# Patient Record
Sex: Female | Born: 1970 | Race: White | Hispanic: No | Marital: Married | Smoking: Never smoker
Health system: Southern US, Community
[De-identification: ages and names within clinical notes are randomized; demographics above are authoritative.]

## PROBLEM LIST (undated history)

## (undated) DIAGNOSIS — G43109 Migraine with aura, not intractable, without status migrainosus: Secondary | ICD-10-CM

## (undated) DIAGNOSIS — Z8719 Personal history of other diseases of the digestive system: Secondary | ICD-10-CM

## (undated) DIAGNOSIS — T7840XA Allergy, unspecified, initial encounter: Secondary | ICD-10-CM

## (undated) DIAGNOSIS — R112 Nausea with vomiting, unspecified: Secondary | ICD-10-CM

## (undated) DIAGNOSIS — C801 Malignant (primary) neoplasm, unspecified: Secondary | ICD-10-CM

## (undated) DIAGNOSIS — K219 Gastro-esophageal reflux disease without esophagitis: Secondary | ICD-10-CM

## (undated) DIAGNOSIS — R42 Dizziness and giddiness: Secondary | ICD-10-CM

## (undated) DIAGNOSIS — Z803 Family history of malignant neoplasm of breast: Secondary | ICD-10-CM

## (undated) DIAGNOSIS — Z923 Personal history of irradiation: Secondary | ICD-10-CM

## (undated) DIAGNOSIS — Z9889 Other specified postprocedural states: Secondary | ICD-10-CM

## (undated) HISTORY — PX: TONSILLECTOMY: SUR1361

## (undated) HISTORY — PX: REDUCTION MAMMAPLASTY: SUR839

## (undated) HISTORY — PX: TUBAL LIGATION: SHX77

## (undated) HISTORY — DX: Family history of malignant neoplasm of breast: Z80.3

## (undated) HISTORY — DX: Migraine with aura, not intractable, without status migrainosus: G43.109

## (undated) HISTORY — DX: Malignant (primary) neoplasm, unspecified: C80.1

## (undated) HISTORY — DX: Allergy, unspecified, initial encounter: T78.40XA

## (undated) HISTORY — PX: CHOLECYSTECTOMY: SHX55

---
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
## 1998-10-09 ENCOUNTER — Encounter: Payer: Self-pay | Admitting: Orthopedic Surgery

## 1998-10-09 ENCOUNTER — Ambulatory Visit (HOSPITAL_COMMUNITY): Admission: RE | Admit: 1998-10-09 | Discharge: 1998-10-09 | Payer: Self-pay | Admitting: Orthopedic Surgery

## 1999-04-13 ENCOUNTER — Other Ambulatory Visit: Admission: RE | Admit: 1999-04-13 | Discharge: 1999-04-13 | Payer: Self-pay | Admitting: Obstetrics & Gynecology

## 1999-11-29 ENCOUNTER — Inpatient Hospital Stay (HOSPITAL_COMMUNITY): Admission: AD | Admit: 1999-11-29 | Discharge: 1999-11-29 | Payer: Self-pay | Admitting: Obstetrics and Gynecology

## 1999-11-30 ENCOUNTER — Inpatient Hospital Stay (HOSPITAL_COMMUNITY): Admission: AD | Admit: 1999-11-30 | Discharge: 1999-12-02 | Payer: Self-pay | Admitting: Obstetrics and Gynecology

## 2000-05-17 ENCOUNTER — Other Ambulatory Visit: Admission: RE | Admit: 2000-05-17 | Discharge: 2000-05-17 | Payer: Self-pay | Admitting: Obstetrics & Gynecology

## 2000-10-12 ENCOUNTER — Ambulatory Visit (HOSPITAL_COMMUNITY): Admission: RE | Admit: 2000-10-12 | Discharge: 2000-10-12 | Payer: Self-pay | Admitting: Obstetrics & Gynecology

## 2001-04-06 ENCOUNTER — Encounter: Payer: Self-pay | Admitting: Family Medicine

## 2001-04-06 LAB — CONVERTED CEMR LAB
RBC count: 4.8 10*6/uL
WBC, blood: 7 10*3/uL

## 2001-05-31 HISTORY — PX: BREAST REDUCTION SURGERY: SHX8

## 2001-06-30 ENCOUNTER — Other Ambulatory Visit: Admission: RE | Admit: 2001-06-30 | Discharge: 2001-06-30 | Payer: Self-pay | Admitting: Obstetrics and Gynecology

## 2001-11-10 ENCOUNTER — Encounter: Payer: Self-pay | Admitting: Family Medicine

## 2001-11-10 LAB — CONVERTED CEMR LAB
Blood Glucose, Fasting: 79 mg/dL
RBC count: 4.46 10*6/uL
TSH: 1.37 microintl units/mL
WBC, blood: 10.9 10*3/uL

## 2002-07-02 ENCOUNTER — Other Ambulatory Visit: Admission: RE | Admit: 2002-07-02 | Discharge: 2002-07-02 | Payer: Self-pay | Admitting: Obstetrics & Gynecology

## 2003-07-16 ENCOUNTER — Other Ambulatory Visit: Admission: RE | Admit: 2003-07-16 | Discharge: 2003-07-16 | Payer: Self-pay | Admitting: Obstetrics & Gynecology

## 2004-03-22 ENCOUNTER — Emergency Department: Payer: Self-pay | Admitting: Emergency Medicine

## 2004-04-15 ENCOUNTER — Ambulatory Visit: Payer: Self-pay | Admitting: Family Medicine

## 2004-04-28 ENCOUNTER — Ambulatory Visit: Payer: Self-pay | Admitting: Family Medicine

## 2004-07-08 ENCOUNTER — Ambulatory Visit: Payer: Self-pay | Admitting: Family Medicine

## 2004-07-21 ENCOUNTER — Other Ambulatory Visit: Admission: RE | Admit: 2004-07-21 | Discharge: 2004-07-21 | Payer: Self-pay | Admitting: Obstetrics & Gynecology

## 2004-08-05 ENCOUNTER — Ambulatory Visit: Payer: Self-pay | Admitting: Family Medicine

## 2004-12-29 ENCOUNTER — Ambulatory Visit: Payer: Self-pay | Admitting: Family Medicine

## 2006-08-01 ENCOUNTER — Ambulatory Visit: Payer: Self-pay | Admitting: Family Medicine

## 2007-11-07 ENCOUNTER — Ambulatory Visit: Payer: Self-pay | Admitting: Family Medicine

## 2007-11-07 ENCOUNTER — Encounter: Payer: Self-pay | Admitting: Family Medicine

## 2007-11-07 DIAGNOSIS — Z87898 Personal history of other specified conditions: Secondary | ICD-10-CM | POA: Insufficient documentation

## 2007-11-08 ENCOUNTER — Telehealth (INDEPENDENT_AMBULATORY_CARE_PROVIDER_SITE_OTHER): Payer: Self-pay | Admitting: Internal Medicine

## 2007-11-27 ENCOUNTER — Ambulatory Visit: Payer: Self-pay | Admitting: Family Medicine

## 2007-11-27 DIAGNOSIS — L723 Sebaceous cyst: Secondary | ICD-10-CM | POA: Insufficient documentation

## 2010-01-06 ENCOUNTER — Encounter (INDEPENDENT_AMBULATORY_CARE_PROVIDER_SITE_OTHER): Payer: Self-pay | Admitting: *Deleted

## 2010-07-02 NOTE — Progress Notes (Signed)
Summary: ? Strep/ z-pak rx  Phone Note Call from Patient   Caller: Patient Call For: Dr. Hetty Ely Summary of Call: Pt called to advise that she was seen yesterday and is feeling much worse today.  Pt stated that she was told to call back on Friday if not better and Dr. Hetty Ely would call something in for her, but has alot to do this weekend and feels that she needs medication today.  Pt stated that she had a rapid strep test done yesterday and it was negative.  Pt feels that she has strep and her stomach has been upset and she continues to have a fever.  [facility] Initial call taken by: Sydell Axon,  November 08, 2007 8:53 AM  Follow-up for Phone Call        Rx attatched, see back Mon if not improved  Billie-Lynn Tyler Deis FNP  November 08, 2007 1:49 PM   Additional Follow-up for Phone Call Additional follow up Details #1::        med phoned to pharmacy. pt informed. Additional Follow-up by: Cooper Render,  November 08, 2007 2:38 PM    New/Updated Medications: ZITHROMAX Z-PAK 250 MG  TABS (AZITHROMYCIN) use as directed   Prescriptions: ZITHROMAX Z-PAK 250 MG  TABS (AZITHROMYCIN) use as directed  #1 x 0   Entered and Authorized by:   Gildardo Griffes FNP   Signed by:   Cooper Render on 11/08/2007   Method used:   Telephoned to ...         RxID:   [id number]

## 2010-07-02 NOTE — Letter (Signed)
Summary: Janet Lynn letter  [facility]  [address] [place], [place] [address]   Phone: [phone]  Fax: [phone]       01/06/2010 MRN: [id number]  Janet Lynn [address] [place], [place]  [address]  Dear Janet Lynn,  [facility] [place], and [facility] announce the retirement of Arta Silence, M.D., from full-time practice at the [facility] office effective November 27, 2009 and his plans of returning part-time.  It is important to Dr. Hetty Ely and to our practice that you understand that [facility] has seven physicians in our office for your health care needs.  We will continue to offer the same exceptional care that you have today.    Dr. Hetty Ely has spoken to many of you about his plans for retirement and returning part-time in the fall.   We will continue to work with you through the transition to schedule appointments for you in the office and meet the high standards that [facility] is committed to.   Again, it is with great pleasure that we share the news that Dr. Hetty Ely will return to [facility] at [facility] in October of 2011 with a reduced schedule.    If you have any questions, or would like to request an appointment with one of our physicians, please call us at [phone] and press the option for Scheduling an appointment.  We take pleasure in providing you with excellent patient care and look forward to seeing you at your next office visit.  Our [facility] Physicians are:  Tillman Abide, M.D. Laurita Quint, M.D. Roxy Manns, M.D. Kerby Nora, M.D. Hannah Beat, M.D. Ruthe Mannan, M.D. We proudly welcomed Raechel Ache, M.D. and Eustaquio Boyden, M.D. to the practice in July/August 2011.  Sincerely,  [facility]

## 2010-07-02 NOTE — Assessment & Plan Note (Signed)
Summary: KNOT ON BACK OF HEAD/HEA   Vital Signs:  Patient Profile:   40 Years Old Female Height:     174 inches (441.96 cm) Weight:      189 pounds Temp:     98.3 degrees F oral Pulse rate:   80 / minute Pulse rhythm:   regular BP sitting:   140 / 80  (left arm) Cuff size:   regular  Vitals Entered By: Providence Crosby (November 27, 2007 1:50 PM)                 Chief Complaint:  CHECK KNOT ON HEAD.  History of Present Illness: Here for two small soft knots on the back of the skull/scalp, the upper one older and slightly larger which she has palpated a lot and is now sore, the smaller down and to the left of the other reasonably new.    Prior Medications Reviewed Using: Patient Recall  Current Allergies (reviewed today): ! * ERYTHROMYCIN      Physical Exam  General:     Well-developed,well-nourished,in no acute distress; alert,appropriate and cooperative throughout examination, nontoxic. Head:     Normocephalic and atraumatic without obvious abnormalities. No apparent alopecia or balding. Eyes:     Conjunctiva clear bilaterally.  Skin:     1cm lesion on upper occipital area of scalp midline with 8mm lesion on lower area of scapl to the left of other lesion, both soft , nonerythematous and reasonably nontender, nonfluctulant without discharge.    Impression & Recommendations:  Problem # 1:  SEBACEOUS CYST, SCALP (ICD-706.2) Assessment: New Presumed seb vs simple cyst vs lipoma....reassured and observe.    ]

## 2010-07-02 NOTE — Assessment & Plan Note (Signed)
Summary: ST/CLE   Vital Signs:  Patient Profile:   40 Years Old Female Weight:      186 pounds Temp:     100.8 degrees F oral Pulse rate:   88 / minute Pulse rhythm:   regular BP sitting:   120 / 80  (left arm) Cuff size:   regular  Vitals Entered By: Providence Crosby (November 07, 2007 12:31 PM)                 Chief Complaint:  severe sore throat// fever .  History of Present Illness: Sunday afternoon started with sore throat, yest Am left ear and left side or throathurt worse, had some fever last night (Did noi use thermometer) and had chills. Has no headache, ears hurts with swallowing left>right, no rhinitis but some nasal congestion., has mild , dry cough now. No SOB or N/V.  Thinks allergic to E-Mycin. Son Selena Batten  saw Dr Alphonsus Sias 29 May, Had strep by RST.     Prior Medications Reviewed Using: Patient Recall  Current Allergies (reviewed today): ! * ERYTHROMYCIN      Physical Exam  General:     Well-developed,well-nourished,in no acute distress; alert,appropriate and cooperative throughout examination Head:     Normocephalic and atraumatic without obvious abnormalities. No apparent alopecia or balding. Eyes:     Conjunctiva clear bilaterally.  Ears:     External ear exam shows no significant lesions or deformities.  Otoscopic examination reveals clear canals, tympanic membranes are intact bilaterally without bulging, retraction, inflammation or discharge. Hearing is grossly normal bilaterally. Nose:     mild inflammation with  septal dev left Mouth:     Oral mucosa and oropharynx without lesions or exudates.  Teeth in good repair. Minimal erythema in pharyngeal area. Neck:     No deformities, masses, or tenderness noted. Chest Wall:     No deformities, masses, or tenderness noted. Lungs:     Normal respiratory effort, chest expands symmetrically. Lungs are clear to auscultation, no crackles or wheezes. Heart:     Normal rate and regular rhythm. S1 and S2 normal  without gallop, murmur, click, rub or other extra sounds.    Impression & Recommendations:  Problem # 1:  PHARYNGITIS-ACUTE (ICD-462) Assessment: New See instructions. Orders: Rapid Strep ([address])  Negative    Patient Instructions: 1)  Gargle with warm salt water every for 2 days minimum.  2)  Tyl regularly, 2   325mg  tabs every 4 hrs. 3)  Drink lots of fluids. 4)  Call Friday if not improved.   ]

## 2010-07-23 ENCOUNTER — Ambulatory Visit (INDEPENDENT_AMBULATORY_CARE_PROVIDER_SITE_OTHER): Payer: BC Managed Care – PPO | Admitting: Family Medicine

## 2010-07-23 ENCOUNTER — Encounter: Payer: Self-pay | Admitting: Family Medicine

## 2010-07-23 DIAGNOSIS — J019 Acute sinusitis, unspecified: Secondary | ICD-10-CM

## 2010-07-23 DIAGNOSIS — Z23 Encounter for immunization: Secondary | ICD-10-CM

## 2010-07-23 HISTORY — DX: Acute sinusitis, unspecified: J01.90

## 2010-07-28 NOTE — Assessment & Plan Note (Signed)
Summary: COUGH,ST/CLE   BCBS   Vital Signs:  Patient profile:   40 year old female Height:      61 inches Weight:      194.50 pounds BMI:     36.88 Temp:     98.6 degrees F oral Pulse rate:   68 / minute Pulse rhythm:   regular BP sitting:   114 / 78  (left arm) Cuff size:   regular  Vitals Entered By: Sydell Axon LPN (July 23, 2010 9:29 AM) CC: Started with a cough about 2 weeks ago, productive at times/ clear to yellow and sore throat   History of Present Illness: Pt here for acute illness but hasn't been to the Gyn in 5 years...encouraged to go. Hasn't yet had a mammo and encouraged to discuss that as well.  She has had congestion, she denies headache, no fever or chills, some ear pain yresterday and today, no rhinitis but signif nasal congestion. She has ST which began today, and has had cough which began about three weeks ago, minimal production this week basically clear. No N/V, no diarrhea. She has had significant muscle aches at times, mostly this week.  She has taken Dayquil and Tussin DM.   Problems Prior to Update: 1)  Sinusitis - Acute-nos  (ICD-461.9) 2)  Sebaceous Cyst, Scalp  (ICD-706.2) 3)  Migraines, Hx of  (ICD-V13.8)  Medications Prior to Update: 1)  None  Allergies: 1)  ! * Erythromycin  Physical Exam  General:  Well-developed,well-nourished,in no acute distress; alert,appropriate and cooperative throughout examination, nontoxic but congested. Head:  Normocephalic and atraumatic without obvious abnormalities. No apparent alopecia or balding. Sinuses tender max. Eyes:  Conjunctiva clear bilaterally.  Ears:  External ear exam shows no significant lesions or deformities.  Otoscopic examination reveals clear canals, tympanic membranes are intact bilaterally without bulging, retraction, inflammation or discharge. Hearing is grossly normal bilaterally. Nose:  mild inflammation with  septal dev left Mouth:  Oral mucosa and oropharynx without lesions or  exudates.  Teeth in good repair. Minimal erythema in pharyngeal area. Neck:  No deformities, masses, or tenderness noted. Lungs:  Normal respiratory effort, chest expands symmetrically. Lungs are clear to auscultation, no crackles or wheezes. Heart:  Normal rate and regular rhythm. S1 and S2 normal without gallop, murmur, click, rub or other extra sounds.   Impression & Recommendations:  Problem # 1:  SINUSITIS - ACUTE-NOS (ICD-461.9) Assessment New  See instructions. Her updated medication list for this problem includes:    Dayquil Multi-symptom 30-325-10 Mg/29ml Liqd (Pseudoephedrine-apap-dm) .Marland Kitchen... As needed    Amoxicillin 500 Mg Caps (Amoxicillin) .Marland Kitchen... 2 tabs by mouth two times a day  Instructed on treatment. Call if symptoms persist or worsen.   Complete Medication List: 1)  Dayquil Multi-symptom 30-325-10 Mg/62ml Liqd (Pseudoephedrine-apap-dm) .... As needed 2)  Amoxicillin 500 Mg Caps (Amoxicillin) .... 2 tabs by mouth two times a day  Other Orders: Tdap => 67yrs IM (57322) Admin 1st Vaccine (02542)  Patient Instructions: 1)  Tdap today. 2)  Take Amox for two weeks. 3)  Take Guaifenesin by [facility] to [facility] or [facility] and getting MUCOUS RELIEF EXPECTORANT (400mg ), take 11/2 tabs by mouth AM and NOON. 4)  Drink lots of fluids anytime taking Guaifenesin.  5)  Take Delsym during for cough. 6)  Use Nyquil. 7)  See Gyn. Prescriptions: AMOXICILLIN 500 MG CAPS (AMOXICILLIN) 2 tabs by mouth two times a day  #56 x 0   Entered and Authorized by:  Shaune Leeks MD   Signed by:   Shaune Leeks MD on 07/23/2010   Method used:   Electronically to        [facility]* (retail)       [address]       [place], [place]  [address]       Ph: [phone]       Fax: [phone]   RxID:   [id number]    Orders Added: 1)  Est. Patient Level III [40347] 2)  Tdap => 90yrs IM [42595] 3)  Admin 1st Vaccine [63875]   Immunizations  Administered:  Tetanus Vaccine:    Vaccine Type: Tdap    Site: left deltoid    Mfr: GlaxoSmithKline    Dose: 0.5 ml    Route: IM    Given by: Sydell Axon LPN    Exp. Date: 03/19/2012    Lot #: [id number]    VIS given: 04/17/08 version given July 23, 2010.   Immunizations Administered:  Tetanus Vaccine:    Vaccine Type: Tdap    Site: left deltoid    Mfr: GlaxoSmithKline    Dose: 0.5 ml    Route: IM    Given by: Sydell Axon LPN    Exp. Date: 03/19/2012    Lot #: [id number]    VIS given: 04/17/08 version given July 23, 2010.  Current Allergies (reviewed today): ! * ERYTHROMYCIN

## 2010-09-01 ENCOUNTER — Encounter: Payer: Self-pay | Admitting: Family Medicine

## 2010-09-03 ENCOUNTER — Ambulatory Visit (INDEPENDENT_AMBULATORY_CARE_PROVIDER_SITE_OTHER): Payer: BC Managed Care – PPO | Admitting: Family Medicine

## 2010-09-03 ENCOUNTER — Encounter: Payer: Self-pay | Admitting: Family Medicine

## 2010-09-03 VITALS — BP 110/70 | HR 80 | Temp 98.7°F | Ht 61.0 in | Wt 195.8 lb

## 2010-09-03 DIAGNOSIS — L608 Other nail disorders: Secondary | ICD-10-CM

## 2010-09-03 DIAGNOSIS — L603 Nail dystrophy: Secondary | ICD-10-CM

## 2010-09-03 NOTE — Progress Notes (Signed)
S: Pt here for suspected toenail fungus infection. She has had dystrophic nails for many months to poss as long as the last year. Affects most nails in thickening and white flakiness, middle toes not affected. Her fingernails are unaffected. She has never been seen or treated for this in the past. O: WDWN WF NAD Nails of hands nml Nails of toes thick and white with flakiness, some of affected nails clear in the immediate paronychial area, middle toes both unaffected. A: toenail Dystrophy P: Will sen culture for fungus. Discussed trmt and side effects poss to include Liver toxicity and chance of recurrence.  Also discussed rauma and frequency of this problem, esp in females. If culture pos, she should call insurance to see coverage of meds due to cost. Will treat if pos and she desires trmt.

## 2010-09-29 LAB — CULTURE, FUNGUS WITHOUT SMEAR

## 2010-10-14 ENCOUNTER — Ambulatory Visit (INDEPENDENT_AMBULATORY_CARE_PROVIDER_SITE_OTHER): Payer: BC Managed Care – PPO | Admitting: Family Medicine

## 2010-10-14 ENCOUNTER — Encounter: Payer: Self-pay | Admitting: Family Medicine

## 2010-10-14 VITALS — BP 112/78 | HR 88 | Temp 97.9°F | Wt 197.2 lb

## 2010-10-14 DIAGNOSIS — B351 Tinea unguium: Secondary | ICD-10-CM | POA: Insufficient documentation

## 2010-10-14 MED ORDER — TERBINAFINE HCL 250 MG PO TABS: 250.0000 mg | ORAL_TABLET | Freq: Every day | ORAL | Status: AC

## 2010-10-14 NOTE — Assessment & Plan Note (Signed)
Will treat today for three months with Lamisil. Labs as ordered.

## 2010-10-14 NOTE — Progress Notes (Signed)
  Subjective:    Patient ID: Janet Lynn, female    DOB: 1971-01-28, 40 y.o.   MRN: [id number]  HPI Pt here for followup of fungal nail infection of the toes from the last visit. Culture results positive for trychopyton species. She has no other problems and understands some possible risk of liver toxicity with trmt and the fact that toe fungus is often recurrent. She desires trmt. She has no complaints otherwise and feels well.    Review of SystemsNoncontributory except as above.      Objective:   Physical Exam Thickened white crusting flaking nails on multiple toes.        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 Treat with Lamisil 250 daily for three months. Labs today and in 6 weeks.

## 2010-10-14 NOTE — Patient Instructions (Signed)
Pls schedule CBC and Hepatic panel in 6 weeks, 995.9 Fungal medication trmt.

## 2010-10-15 ENCOUNTER — Ambulatory Visit: Payer: BC Managed Care – PPO | Admitting: Family Medicine

## 2010-10-15 LAB — CBC WITH DIFFERENTIAL/PLATELET
Basophils Absolute: 0.1 10*3/uL (ref 0.0–0.1)
Basophils Relative: 0.8 % (ref 0.0–3.0)
Eosinophils Absolute: 0.3 10*3/uL (ref 0.0–0.7)
Eosinophils Relative: 2.1 % (ref 0.0–5.0)
HCT: 39.6 % (ref 36.0–46.0)
Hemoglobin: 13.6 g/dL (ref 12.0–15.0)
Lymphocytes Relative: 26 % (ref 12.0–46.0)
Lymphs Abs: 3.5 10*3/uL (ref 0.7–4.0)
MCHC: 34.5 g/dL (ref 30.0–36.0)
MCV: 89.4 fl (ref 78.0–100.0)
Monocytes Absolute: 0.8 10*3/uL (ref 0.1–1.0)
Monocytes Relative: 5.7 % (ref 3.0–12.0)
Neutro Abs: 8.9 10*3/uL — ABNORMAL HIGH (ref 1.4–7.7)
Neutrophils Relative %: 65.4 % (ref 43.0–77.0)
Platelets: 418 10*3/uL — ABNORMAL HIGH (ref 150.0–400.0)
RBC: 4.43 Mil/uL (ref 3.87–5.11)
RDW: 13.3 % (ref 11.5–14.6)
WBC: 13.6 10*3/uL — ABNORMAL HIGH (ref 4.5–10.5)

## 2010-10-15 LAB — HEPATIC FUNCTION PANEL
ALT: 16 U/L (ref 0–35)
AST: 17 U/L (ref 0–37)
Albumin: 3.5 g/dL (ref 3.5–5.2)
Alkaline Phosphatase: 58 U/L (ref 39–117)
Bilirubin, Direct: 0 mg/dL (ref 0.0–0.3)
Total Bilirubin: 0.2 mg/dL — ABNORMAL LOW (ref 0.3–1.2)
Total Protein: 6.3 g/dL (ref 6.0–8.3)

## 2010-11-23 ENCOUNTER — Other Ambulatory Visit (INDEPENDENT_AMBULATORY_CARE_PROVIDER_SITE_OTHER): Payer: BC Managed Care – PPO

## 2010-11-23 DIAGNOSIS — B351 Tinea unguium: Secondary | ICD-10-CM

## 2010-11-23 LAB — CBC WITH DIFFERENTIAL/PLATELET
Basophils Absolute: 0.1 10*3/uL (ref 0.0–0.1)
Basophils Relative: 0.5 % (ref 0.0–3.0)
Eosinophils Absolute: 0.2 10*3/uL (ref 0.0–0.7)
Eosinophils Relative: 1.8 % (ref 0.0–5.0)
HCT: 40.1 % (ref 36.0–46.0)
Hemoglobin: 13.5 g/dL (ref 12.0–15.0)
Lymphocytes Relative: 26.2 % (ref 12.0–46.0)
Lymphs Abs: 2.8 10*3/uL (ref 0.7–4.0)
MCHC: 33.7 g/dL (ref 30.0–36.0)
MCV: 90.3 fl (ref 78.0–100.0)
Monocytes Absolute: 0.8 10*3/uL (ref 0.1–1.0)
Monocytes Relative: 7.2 % (ref 3.0–12.0)
Neutro Abs: 6.9 10*3/uL (ref 1.4–7.7)
Neutrophils Relative %: 64.3 % (ref 43.0–77.0)
Platelets: 366 10*3/uL (ref 150.0–400.0)
RBC: 4.44 Mil/uL (ref 3.87–5.11)
RDW: 13.1 % (ref 11.5–14.6)
WBC: 10.8 10*3/uL — ABNORMAL HIGH (ref 4.5–10.5)

## 2010-11-23 LAB — HEPATIC FUNCTION PANEL
ALT: 17 U/L (ref 0–35)
AST: 16 U/L (ref 0–37)
Albumin: 3.8 g/dL (ref 3.5–5.2)
Alkaline Phosphatase: 66 U/L (ref 39–117)
Bilirubin, Direct: 0.1 mg/dL (ref 0.0–0.3)
Total Bilirubin: 0.4 mg/dL (ref 0.3–1.2)
Total Protein: 6.5 g/dL (ref 6.0–8.3)

## 2011-07-27 ENCOUNTER — Encounter: Payer: Self-pay | Admitting: Family Medicine

## 2011-07-27 ENCOUNTER — Ambulatory Visit (INDEPENDENT_AMBULATORY_CARE_PROVIDER_SITE_OTHER): Payer: BC Managed Care – PPO | Admitting: Family Medicine

## 2011-07-27 VITALS — BP 122/86 | HR 92 | Temp 98.4°F | Wt 182.0 lb

## 2011-07-27 DIAGNOSIS — R42 Dizziness and giddiness: Secondary | ICD-10-CM

## 2011-07-27 NOTE — Progress Notes (Signed)
She's been losing weight since the fall of 2012.  On 07/13/11 she was exercising but then got dizzy after exercise.  Vomited several times in first 24 hours, that was the worst period of sx.  The room was spinning.  Vertigo returns if laying on L side.  Mild sinus congestion.  Sx happen every day, but not all day.  No syncope. Vertigo is quick onset, can be brief. She is gradually getting better.  No focal neuro changes o/w, ie weakness.   H/o vertigo prev, with bending over, but this is worse than prev.    Meds, vitals, and allergies reviewed.   ROS: See HPI.  Otherwise, noncontributory.  GEN: nad, alert and oriented HEENT: mucous membranes moist NECK: supple w/o LA CV: rrr.  no murmur PULM: ctab, no inc wob ABD: soft, +bs EXT: no edema SKIN: no acute rash CN 2-12 wnl B, S/S/DTR wnl x4 DHP positive for sx

## 2011-07-27 NOTE — Assessment & Plan Note (Addendum)
Likely BPV.  Use meclizine or other otc motion sickness meds.  Should improve/resolve, but can return.  Use bedside exercise and call back as needed.   

## 2011-07-27 NOTE — Patient Instructions (Signed)
Likely BPV.  Use meclizine or other otc motion sickness meds.  Should improve/resolve, but can return.  Use bedside exercise and call back as needed.

## 2011-12-21 ENCOUNTER — Ambulatory Visit (INDEPENDENT_AMBULATORY_CARE_PROVIDER_SITE_OTHER): Payer: BC Managed Care – PPO | Admitting: Family Medicine

## 2011-12-21 ENCOUNTER — Encounter: Payer: Self-pay | Admitting: Family Medicine

## 2011-12-21 VITALS — BP 138/98 | HR 82 | Temp 98.7°F | Wt 170.8 lb

## 2011-12-21 DIAGNOSIS — M25539 Pain in unspecified wrist: Secondary | ICD-10-CM | POA: Insufficient documentation

## 2011-12-21 NOTE — Progress Notes (Signed)
Nodule on L wrist, dorsum.  Noted more in last 2 weeks, enlarging.  Pain in hand up the arm, constant.  Dull ache and that episodically worsens. Grip is still normal.  No R handed sx.  Is right handed.  No trauma recently, she had strained her wrist in 9/12.  Types a lot with work.  No neck pain except for some occ muscle pain on the L trap area.  Used a brace for only a day or two.    Meds, vitals, and allergies reviewed.   ROS: See HPI.  Otherwise, noncontributory.  nad Normal ROM neck but L trap tight Normal ROM L shoulder and elbow Normal inspection, ROM of wrist but small dorsal mass c/w ganglion palpated on wrist Finklestein positive but strength intact Distally NV intact phalen and tinel neg

## 2011-12-21 NOTE — Patient Instructions (Addendum)
Wear the brace as much as tolerated.  Ice your wrist several times a day.  Take ibuprofen with food 2-3 times a day.  Stretch your neck.  If worse or not improved, then call back and we'll set up referral to the hand center.

## 2011-12-21 NOTE — Assessment & Plan Note (Signed)
Likely combination of tendonitis and irritation from compression due to ganglion.  Would ice and wear wrist brace, ibuprofen with GI caution.  Refer to hand clinic if not improved. Not c/w CTS.  D/w pt about ROM, stretching for trap sx.  She agrees.

## 2015-04-07 ENCOUNTER — Ambulatory Visit (INDEPENDENT_AMBULATORY_CARE_PROVIDER_SITE_OTHER): Payer: BLUE CROSS/BLUE SHIELD | Admitting: Family Medicine

## 2015-04-07 ENCOUNTER — Encounter: Payer: Self-pay | Admitting: Family Medicine

## 2015-04-07 ENCOUNTER — Encounter (INDEPENDENT_AMBULATORY_CARE_PROVIDER_SITE_OTHER): Payer: Self-pay

## 2015-04-07 VITALS — BP 114/70 | HR 93 | Temp 98.6°F | Wt 194.5 lb

## 2015-04-07 DIAGNOSIS — H6980 Other specified disorders of Eustachian tube, unspecified ear: Secondary | ICD-10-CM

## 2015-04-07 MED ORDER — BENZONATATE 200 MG PO CAPS: 200.0000 mg | ORAL_CAPSULE | Freq: Three times a day (TID) | ORAL | Status: DC | PRN

## 2015-04-07 NOTE — Progress Notes (Signed)
Pre visit review using our clinic review tool, if applicable. No additional management support is needed unless otherwise documented below in the visit note.  Sx started about 8 days ago.  Started with ST, then had fever and ST continued the next day.  Was out of work 04/01/15.  ST continues.  Some cough recently.  Some nasal congestion and bloody rhinorrhea.  Cough is more often at night.  No fever now, none in the last ~5 days.  Taking nyquil OTC prn qhs.  No vomiting, no diarrhea.  Some sputum.  L ear pain, continues.  No R ear pain.  Some wheeze with the cough.  Not improved much in the last few days, though her fever is resolved.  Hoarse today.  No known sick contacts.    Meds, vitals, and allergies reviewed.   ROS: See HPI.  Otherwise, noncontributory.  GEN: nad, alert and oriented HEENT: mucous membranes moist, tm w/o erythema, nasal exam w/o erythema, clear discharge noted with some small areas of crusted blood noted,  OP with cobblestoning, L ETD noted.  Sinuses not ttp x4 NECK: supple w/o LA CV: rrr.   PULM: ctab, no inc wob

## 2015-04-07 NOTE — Patient Instructions (Signed)
Likely eustachian tube dysfunction.  Try tessalon for the cough.  Gargle with saline, try using nasal saline.  Take claritin if the nyquil doesn't have benadryl in it.  Gently try to pop your ears.  Take care.  Glad to see you.

## 2015-04-08 DIAGNOSIS — H6980 Other specified disorders of Eustachian tube, unspecified ear: Secondary | ICD-10-CM | POA: Insufficient documentation

## 2015-04-08 NOTE — Assessment & Plan Note (Signed)
Likely viral. Nontoxic.  Likely eustachian tube dysfunction.  Try tessalon for the cough.  Gargle with saline, try using nasal saline.  Take claritin if the nyquil doesn't have benadryl in it.  Gently try to pop ears.  F/u prn.  Should resolve.  She agrees.

## 2016-05-31 LAB — HM HEPATITIS C SCREENING LAB: HM Hepatitis Screen: NEGATIVE

## 2016-05-31 LAB — HM HIV SCREENING LAB: HM HIV Screening: NEGATIVE

## 2016-07-12 DIAGNOSIS — Z124 Encounter for screening for malignant neoplasm of cervix: Secondary | ICD-10-CM | POA: Diagnosis not present

## 2016-07-12 DIAGNOSIS — Z01419 Encounter for gynecological examination (general) (routine) without abnormal findings: Secondary | ICD-10-CM | POA: Diagnosis not present

## 2016-07-12 DIAGNOSIS — Z1231 Encounter for screening mammogram for malignant neoplasm of breast: Secondary | ICD-10-CM | POA: Diagnosis not present

## 2016-07-12 DIAGNOSIS — Z6834 Body mass index (BMI) 34.0-34.9, adult: Secondary | ICD-10-CM | POA: Diagnosis not present

## 2016-07-12 LAB — HM PAP SMEAR

## 2016-08-03 ENCOUNTER — Encounter: Payer: Self-pay | Admitting: Family Medicine

## 2017-04-11 DIAGNOSIS — Z6830 Body mass index (BMI) 30.0-30.9, adult: Secondary | ICD-10-CM | POA: Diagnosis not present

## 2017-04-11 DIAGNOSIS — L723 Sebaceous cyst: Secondary | ICD-10-CM | POA: Diagnosis not present

## 2017-07-15 ENCOUNTER — Encounter: Payer: Self-pay | Admitting: Family Medicine

## 2017-07-15 ENCOUNTER — Ambulatory Visit (INDEPENDENT_AMBULATORY_CARE_PROVIDER_SITE_OTHER): Payer: BLUE CROSS/BLUE SHIELD | Admitting: Family Medicine

## 2017-07-15 VITALS — BP 118/76 | HR 93 | Temp 99.2°F | Wt 156.5 lb

## 2017-07-15 DIAGNOSIS — J019 Acute sinusitis, unspecified: Secondary | ICD-10-CM

## 2017-07-15 MED ORDER — FLUTICASONE PROPIONATE 50 MCG/ACT NA SUSP: 2.0000 | Freq: Every day | NASAL | 1 refills | Status: DC

## 2017-07-15 NOTE — Assessment & Plan Note (Signed)
Anticipate viral given short duration. Supportive care reviewed as per instructions. Red flags to update [place] for abx course reviewed. Pt agrees with plan.

## 2017-07-15 NOTE — Patient Instructions (Signed)
You have a sinus viral infection. Take medicine as prescribed: flonase.  Push fluids and plenty of rest. Nasal saline irrigation or neti pot to help drain sinuses. May use plain mucinex with plenty of fluid to help mobilize mucous. Take ibuprofen 400-600mg  with meals for next 3-4 days.  Please let us know if fever >101.5, worsening productive cough, or if ongoing symptoms of head congestion and headache past 7-10 days to consider antibiotic course.    Sinusitis, Adult Sinusitis is soreness and inflammation of your sinuses. Sinuses are hollow spaces in the bones around your face. Your sinuses are located:  Around your eyes.  In the middle of your forehead.  Behind your nose.  In your cheekbones.  Your sinuses and nasal passages are lined with a stringy fluid (mucus). Mucus normally drains out of your sinuses. When your nasal tissues become inflamed or swollen, the mucus can become trapped or blocked so air cannot flow through your sinuses. This allows bacteria, viruses, and funguses to grow, which leads to infection. Sinusitis can develop quickly and last for 7?10 days (acute) or for more than 12 weeks (chronic). Sinusitis often develops after a cold. What are the causes? This condition is caused by anything that creates swelling in the sinuses or stops mucus from draining, including:  Allergies.  Asthma.  Bacterial or viral infection.  Abnormally shaped bones between the nasal passages.  Nasal growths that contain mucus (nasal polyps).  Narrow sinus openings.  Pollutants, such as chemicals or irritants in the air.  A foreign object stuck in the nose.  A fungal infection. This is rare.  What increases the risk? The following factors may make you more likely to develop this condition:  Having allergies or asthma.  Having had a recent cold or respiratory tract infection.  Having structural deformities or blockages in your nose or sinuses.  Having a weak immune  system.  Doing a lot of swimming or diving.  Overusing nasal sprays.  Smoking.  What are the signs or symptoms? The main symptoms of this condition are pain and a feeling of pressure around the affected sinuses. Other symptoms include:  Upper toothache.  Earache.  Headache.  Bad breath.  Decreased sense of smell and taste.  A cough that may get worse at night.  Fatigue.  Fever.  Thick drainage from your nose. The drainage is often green and it may contain pus (purulent).  Stuffy nose or congestion.  Postnasal drip. This is when extra mucus collects in the throat or back of the nose.  Swelling and warmth over the affected sinuses.  Sore throat.  Sensitivity to light.  How is this diagnosed? This condition is diagnosed based on symptoms, a medical history, and a physical exam. To find out if your condition is acute or chronic, your health care provider may:  Look in your nose for signs of nasal polyps.  Tap over the affected sinus to check for signs of infection.  View the inside of your sinuses using an imaging device that has a light attached (endoscope).  If your health care provider suspects that you have chronic sinusitis, you may also:  Be tested for allergies.  Have a sample of mucus taken from your nose (nasal culture) and checked for bacteria.  Have a mucus sample examined to see if your sinusitis is related to an allergy.  If your sinusitis does not respond to treatment and it lasts longer than 8 weeks, you may have an MRI or CT scan to  check your sinuses. These scans also help to determine how severe your infection is. In rare cases, a bone biopsy may be done to rule out more serious types of fungal sinus disease. How is this treated? Treatment for sinusitis depends on the cause and whether your condition is chronic or acute. If a virus is causing your sinusitis, your symptoms will go away on their own within 10 days. You may be given medicines to  relieve your symptoms, including:  Topical nasal decongestants. They shrink swollen nasal passages and let mucus drain from your sinuses.  Antihistamines. These drugs block inflammation that is triggered by allergies. This can help to ease swelling in your nose and sinuses.  Topical nasal corticosteroids. These are nasal sprays that ease inflammation and swelling in your nose and sinuses.  Nasal saline washes. These rinses can help to get rid of thick mucus in your nose.  If your condition is caused by bacteria, you will be given an antibiotic medicine. If your condition is caused by a fungus, you will be given an antifungal medicine. Surgery may be needed to correct underlying conditions, such as narrow nasal passages. Surgery may also be needed to remove polyps. Follow these instructions at home: Medicines  Take, use, or apply over-the-counter and prescription medicines only as told by your health care provider. These may include nasal sprays.  If you were prescribed an antibiotic medicine, take it as told by your health care provider. Do not stop taking the antibiotic even if you start to feel better. Hydrate and Humidify  Drink enough water to keep your urine clear or pale yellow. Staying hydrated will help to thin your mucus.  Use a cool mist humidifier to keep the humidity level in your home above 50%.  Inhale steam for 10-15 minutes, 3-4 times a day or as told by your health care provider. You can do this in the bathroom while a hot shower is running.  Limit your exposure to cool or dry air. Rest  Rest as much as possible.  Sleep with your head raised (elevated).  Make sure to get enough sleep each night. General instructions  Apply a warm, moist washcloth to your face 3-4 times a day or as told by your health care provider. This will help with discomfort.  Wash your hands often with soap and water to reduce your exposure to viruses and other germs. If soap and water are  not available, use hand sanitizer.  Do not smoke. Avoid being around people who are smoking (secondhand smoke).  Keep all follow-up visits as told by your health care provider. This is important. Contact a health care provider if:  You have a fever.  Your symptoms get worse.  Your symptoms do not improve within 10 days. Get help right away if:  You have a severe headache.  You have persistent vomiting.  You have pain or swelling around your face or eyes.  You have vision problems.  You develop confusion.  Your neck is stiff.  You have trouble breathing. This information is not intended to replace advice given to you by your health care provider. Make sure you discuss any questions you have with your health care provider. Document Released: 05/17/2005 Document Revised: 01/11/2016 Document Reviewed: 03/12/2015 Elsevier Interactive Patient Education  [employer].

## 2017-07-15 NOTE — Progress Notes (Signed)
BP 118/76 (BP Location: Left Arm, Patient Position: Sitting, Cuff Size: Normal)   Pulse 93   Temp 99.2 F (37.3 C) (Oral)   Wt 156 lb 8 oz (71 kg)   LMP 06/02/2017   SpO2 99%   BMI 29.57 kg/m    CC: sinus congestion Subjective:    Patient ID: Janet Lynn, female    DOB: 16-Oct-1970, 47 y.o.   MRN: [id number]  HPI: Janet Lynn is a 47 y.o. female presenting on 07/15/2017 for Sinus Problem (Runny nose, nasal congestion- green mucous and sinus pressure/pain. Started 07/13/17. Taking Nyquil and Alka Seltzer Cold, not helpful.)   3d h/o nasal congestion, rhinorrhea, productive coughing, ST, PNdrainage, felt feverish, pressure headache.   No chills, ear or tooth pain, abd pain, dyspnea or wheezing So far has tried nyquil and alka seltzer cold with only temporary relief.  No sick contacts at home. No smokers at home.  No h/o asthma   Relevant past medical, surgical, family and social history reviewed and updated as indicated. Interim medical history since our last visit reviewed. Allergies and medications reviewed and updated. Outpatient Medications Prior to Visit  Medication Sig Dispense Refill  . benzonatate (TESSALON) 200 MG capsule Take 1 capsule (200 mg total) by mouth 3 (three) times daily as needed. 30 capsule 1   No facility-administered medications prior to visit.      Per HPI unless specifically indicated in ROS section below Review of Systems     Objective:    BP 118/76 (BP Location: Left Arm, Patient Position: Sitting, Cuff Size: Normal)   Pulse 93   Temp 99.2 F (37.3 C) (Oral)   Wt 156 lb 8 oz (71 kg)   LMP 06/02/2017   SpO2 99%   BMI 29.57 kg/m   Wt Readings from Last 3 Encounters:  07/15/17 156 lb 8 oz (71 kg)  04/07/15 194 lb 8 oz (88.2 kg)  12/21/11 170 lb 12 oz (77.5 kg)    Physical Exam  Constitutional: She appears well-developed and well-nourished. No distress.  HENT:  Head: Normocephalic and atraumatic.  Right Ear: Hearing,  tympanic membrane, external ear and ear canal normal.  Left Ear: Hearing, tympanic membrane, external ear and ear canal normal.  Nose: Mucosal edema (nasal mucosal congestion and erythema) present. No rhinorrhea. Right sinus exhibits maxillary sinus tenderness. Right sinus exhibits no frontal sinus tenderness. Left sinus exhibits maxillary sinus tenderness. Left sinus exhibits no frontal sinus tenderness.  Mouth/Throat: Uvula is midline, oropharynx is clear and moist and mucous membranes are normal. No oropharyngeal exudate, posterior oropharyngeal edema, posterior oropharyngeal erythema or tonsillar abscesses.  Eyes: Conjunctivae and EOM are normal. Pupils are equal, round, and reactive to light. No scleral icterus.  Neck: Normal range of motion. Neck supple.  Cardiovascular: Normal rate, regular rhythm, normal heart sounds and intact distal pulses.  No murmur heard. Pulmonary/Chest: Effort normal and breath sounds normal. No respiratory distress. She has no wheezes. She has no rales.  Lymphadenopathy:    She has no cervical adenopathy.  Skin: Skin is warm and dry. No rash noted.  Nursing note and vitals reviewed.     Assessment & Plan:   Problem List Items Addressed This Visit    Acute sinusitis - Primary    Anticipate viral given short duration. Supportive care reviewed as per instructions. Red flags to update [place] for abx course reviewed. Pt agrees with plan.       Relevant Medications   fluticasone (FLONASE) 50 MCG/ACT nasal  spray       Meds ordered this encounter  Medications  . fluticasone (FLONASE) 50 MCG/ACT nasal spray    Sig: Place 2 sprays into both nostrils daily.    Dispense:  16 g    Refill:  1   No orders of the defined types were placed in this encounter.   Follow up plan: Return if symptoms worsen or fail to improve.  Ria Bush, MD

## 2017-07-19 DIAGNOSIS — Z6829 Body mass index (BMI) 29.0-29.9, adult: Secondary | ICD-10-CM | POA: Diagnosis not present

## 2017-07-19 DIAGNOSIS — Z0000 Encounter for general adult medical examination without abnormal findings: Secondary | ICD-10-CM | POA: Diagnosis not present

## 2017-07-19 DIAGNOSIS — Z01419 Encounter for gynecological examination (general) (routine) without abnormal findings: Secondary | ICD-10-CM | POA: Diagnosis not present

## 2017-07-19 DIAGNOSIS — Z113 Encounter for screening for infections with a predominantly sexual mode of transmission: Secondary | ICD-10-CM | POA: Diagnosis not present

## 2017-07-19 DIAGNOSIS — Z1231 Encounter for screening mammogram for malignant neoplasm of breast: Secondary | ICD-10-CM | POA: Diagnosis not present

## 2017-07-20 LAB — HM MAMMOGRAPHY

## 2017-07-21 ENCOUNTER — Encounter: Payer: Self-pay | Admitting: Family Medicine

## 2017-07-21 LAB — LAB REPORT - SCANNED
Cholesterol, Total: 209
Creatinine, Ser: 0.68
Glucose: 87
HDL Cholesterol: 44
Hemoglobin: 14.4
LDL Cholesterol (Calc): 131
TSH: 2.52
Triglycerides: 172

## 2017-08-16 ENCOUNTER — Encounter: Payer: Self-pay | Admitting: Family Medicine

## 2018-08-01 DIAGNOSIS — Z6829 Body mass index (BMI) 29.0-29.9, adult: Secondary | ICD-10-CM | POA: Diagnosis not present

## 2018-08-01 DIAGNOSIS — Z124 Encounter for screening for malignant neoplasm of cervix: Secondary | ICD-10-CM | POA: Diagnosis not present

## 2018-08-01 DIAGNOSIS — Z1231 Encounter for screening mammogram for malignant neoplasm of breast: Secondary | ICD-10-CM | POA: Diagnosis not present

## 2018-08-01 DIAGNOSIS — Z01419 Encounter for gynecological examination (general) (routine) without abnormal findings: Secondary | ICD-10-CM | POA: Diagnosis not present

## 2018-08-01 LAB — HM PAP SMEAR

## 2018-08-02 LAB — HM MAMMOGRAPHY

## 2018-08-08 ENCOUNTER — Encounter: Payer: Self-pay | Admitting: Family Medicine

## 2019-11-27 DIAGNOSIS — N959 Unspecified menopausal and perimenopausal disorder: Secondary | ICD-10-CM | POA: Diagnosis not present

## 2019-11-27 DIAGNOSIS — Z1231 Encounter for screening mammogram for malignant neoplasm of breast: Secondary | ICD-10-CM | POA: Diagnosis not present

## 2019-11-27 DIAGNOSIS — Z0000 Encounter for general adult medical examination without abnormal findings: Secondary | ICD-10-CM | POA: Diagnosis not present

## 2019-11-27 DIAGNOSIS — Z01419 Encounter for gynecological examination (general) (routine) without abnormal findings: Secondary | ICD-10-CM | POA: Diagnosis not present

## 2019-11-27 DIAGNOSIS — Z6834 Body mass index (BMI) 34.0-34.9, adult: Secondary | ICD-10-CM | POA: Diagnosis not present

## 2019-11-27 LAB — HM MAMMOGRAPHY

## 2019-11-28 LAB — LAB REPORT - SCANNED
ALT: 15 (ref 3–30)
AST: 14
Cholesterol: 241
Creatinine, Ser: 0.89
FSH: 51.6
Glucose: 113
HDL: 52
Hemoglobin: 14
LDL (calc): 158
Platelets: 388
TSH: 2.44
Triglycerides: 171 — AB (ref 40–160)
WBC: 11.8

## 2019-12-13 ENCOUNTER — Encounter: Payer: Self-pay | Admitting: Family Medicine

## 2019-12-24 ENCOUNTER — Telehealth: Payer: Self-pay | Admitting: *Deleted

## 2019-12-24 NOTE — Telephone Encounter (Signed)
Patient left a voicemail stating that she has had covid now for 8 days and is still feeling  Lightheaded.  Called patient back and was advised that her son tested positive for covid on 12/16/19. Patient stated the next day she started with symptoms. Patient stated that she has a productive cough (green) and feels lightheaded at times. Patient denies a fever, SOB or difficulty breathing. Patient stated that she did not go get tested because she is sure that she has covid. Patient scheduled for a virtual visit with Dr. Einar Pheasant 12/25/19 at 12:20. Patient was given ER precautions and she verbalized understanding.

## 2019-12-25 ENCOUNTER — Telehealth (INDEPENDENT_AMBULATORY_CARE_PROVIDER_SITE_OTHER): Payer: HRSA Program | Admitting: Family Medicine

## 2019-12-25 ENCOUNTER — Encounter: Payer: Self-pay | Admitting: Family Medicine

## 2019-12-25 ENCOUNTER — Other Ambulatory Visit: Payer: Self-pay

## 2019-12-25 VITALS — Temp 97.7°F | Wt 163.0 lb

## 2019-12-25 DIAGNOSIS — U071 COVID-19: Secondary | ICD-10-CM | POA: Diagnosis not present

## 2019-12-25 NOTE — Telephone Encounter (Signed)
See visit from today

## 2019-12-25 NOTE — Assessment & Plan Note (Signed)
No specific risk factors. Suspect lightheadedness is dehydration in setting of diarrhea. Advised drinking at least 5 bottles of water per day. Denies SOB with activity - though if dizziness persists would recommend in-person evaluation at urgent so vitals. Denies heart racing and reports good cap refill - though mouth dry. If new fevers call, if worsening symptoms call. Discussed course - typical 2 weeks, cough could linger 2-4 months - no need for antibiotics at this time.

## 2019-12-25 NOTE — Progress Notes (Signed)
I connected with Janet Lynn on 12/25/19 at 12:20 PM EDT by video and verified that I am speaking with the correct person using two identifiers.   I discussed the limitations, risks, security and privacy concerns of performing an evaluation and management service by video and the availability of in person appointments. I also discussed with the patient that there may be a patient responsible charge related to this service. The patient expressed understanding and agreed to proceed.  Patient location: Home Provider Location: [place] Participants: [name] and Tilley Tickle [place]   Subjective:     Janet Lynn is a 49 y.o. female presenting for Cough (x 8 days), Diarrhea (x 5 days ), and Dizziness (x 4 days )     HPI   #Covid-19 infection - son was diagnosed - came home on 7/16 and was diagnosed on 7/18 - 12/17/2019 was when her symptoms began - fever, headache, sore throat, cough - cough is persisting - productive  - lack of appetite - lightheadedness - cannot stand up for very long - gets lightheaded with just sitting in the chair - hydration: water mostly - 3 bottle of water per day - was doing gatorade in the beginning - diarrhea x 5 days - 3-4 times per day - breathing - has been good - denies SOB with activity    Has not vaccine  Review of Systems   Social History   Tobacco Use  Smoking Status Never Smoker  Smokeless Tobacco Never Used        Objective:   BP Readings from Last 3 Encounters:  07/15/17 118/76  04/07/15 114/70  12/21/11 (!) 138/98   Wt Readings from Last 3 Encounters:  12/25/19 163 lb (73.9 kg)  07/15/17 156 lb 8 oz (71 kg)  04/07/15 194 lb 8 oz (88.2 kg)   Temp 97.7 F (36.5 C) (Oral)   Wt 163 lb (73.9 kg)   BMI 30.80 kg/m    Physical Exam Constitutional:      Appearance: Normal appearance. She is not ill-appearing.  HENT:     Head: Normocephalic and atraumatic.     Right Ear: External ear  normal.     Left Ear: External ear normal.  Eyes:     Conjunctiva/sclera: Conjunctivae normal.  Pulmonary:     Effort: Pulmonary effort is normal. No respiratory distress.  Neurological:     Mental Status: She is alert. Mental status is at baseline.  Psychiatric:        Mood and Affect: Mood normal.        Behavior: Behavior normal.        Thought Content: Thought content normal.        Judgment: Judgment normal.             Assessment & Plan:   Problem List Items Addressed This Visit      Other   COVID-19 virus infection - Primary    No specific risk factors. Suspect lightheadedness is dehydration in setting of diarrhea. Advised drinking at least 5 bottles of water per day. Denies SOB with activity - though if dizziness persists would recommend in-person evaluation at urgent so vitals. Denies heart racing and reports good cap refill - though mouth dry. If new fevers call, if worsening symptoms call. Discussed course - typical 2 weeks, cough could linger 2-4 months - no need for antibiotics at this time.           Return if  symptoms worsen or fail to improve.  [name], MD

## 2021-02-11 ENCOUNTER — Emergency Department
Admission: EM | Admit: 2021-02-11 | Discharge: 2021-02-11 | Disposition: A | Discharge status: Home / Self Care | Payer: BC Managed Care – PPO | Attending: Emergency Medicine | Admitting: Emergency Medicine

## 2021-02-11 ENCOUNTER — Emergency Department: Payer: BC Managed Care – PPO

## 2021-02-11 ENCOUNTER — Encounter: Payer: Self-pay | Admitting: Emergency Medicine

## 2021-02-11 ENCOUNTER — Other Ambulatory Visit: Payer: Self-pay

## 2021-02-11 ENCOUNTER — Telehealth: Payer: Self-pay | Admitting: *Deleted

## 2021-02-11 DIAGNOSIS — R1010 Upper abdominal pain, unspecified: Secondary | ICD-10-CM | POA: Diagnosis not present

## 2021-02-11 DIAGNOSIS — K2900 Acute gastritis without bleeding: Secondary | ICD-10-CM

## 2021-02-11 DIAGNOSIS — K8080 Other cholelithiasis without obstruction: Secondary | ICD-10-CM | POA: Insufficient documentation

## 2021-02-11 DIAGNOSIS — R1011 Right upper quadrant pain: Secondary | ICD-10-CM | POA: Insufficient documentation

## 2021-02-11 DIAGNOSIS — Z8616 Personal history of COVID-19: Secondary | ICD-10-CM | POA: Diagnosis not present

## 2021-02-11 LAB — COMPREHENSIVE METABOLIC PANEL
ALT: 19 U/L (ref 0–44)
AST: 19 U/L (ref 15–41)
Albumin: 3.9 g/dL (ref 3.5–5.0)
Alkaline Phosphatase: 72 U/L (ref 38–126)
Anion gap: 8 (ref 5–15)
BUN: 20 mg/dL (ref 6–20)
CO2: 28 mmol/L (ref 22–32)
Calcium: 9.1 mg/dL (ref 8.9–10.3)
Chloride: 99 mmol/L (ref 98–111)
Creatinine, Ser: 1.02 mg/dL — ABNORMAL HIGH (ref 0.44–1.00)
GFR, Estimated: >60 mL/min (ref >60)
Glucose, Bld: 92 mg/dL (ref 70–99)
Potassium: 3.8 mmol/L (ref 3.5–5.1)
Sodium: 135 mmol/L (ref 135–145)
Total Bilirubin: 0.5 mg/dL (ref 0.3–1.2)
Total Protein: 7.1 g/dL (ref 6.5–8.1)

## 2021-02-11 LAB — CBC
HCT: 42.3 % (ref 36.0–46.0)
Hemoglobin: 14 g/dL (ref 12.0–15.0)
MCH: 29.4 pg (ref 26.0–34.0)
MCHC: 33.1 g/dL (ref 30.0–36.0)
MCV: 88.7 fL (ref 80.0–100.0)
Platelets: 407 10*3/uL — ABNORMAL HIGH (ref 150–400)
RBC: 4.77 MIL/uL (ref 3.87–5.11)
RDW: 12.5 % (ref 11.5–15.5)
WBC: 11.9 10*3/uL — ABNORMAL HIGH (ref 4.0–10.5)
nRBC: 0 % (ref 0.0–0.2)

## 2021-02-11 LAB — LIPASE, BLOOD: Lipase: 35 U/L (ref 11–51)

## 2021-02-11 LAB — URINALYSIS, COMPLETE (UACMP) WITH MICROSCOPIC
Bilirubin Urine: NEGATIVE
Glucose, UA: NEGATIVE mg/dL
Hgb urine dipstick: NEGATIVE
Ketones, ur: NEGATIVE mg/dL
Nitrite: NEGATIVE
Protein, ur: NEGATIVE mg/dL
Specific Gravity, Urine: 1.008 (ref 1.005–1.030)
pH: 6 (ref 5.0–8.0)

## 2021-02-11 LAB — PREGNANCY, URINE: Preg Test, Ur: NEGATIVE

## 2021-02-11 MED ORDER — SUCRALFATE 1 G PO TABS: 1.0000 g | ORAL_TABLET | Freq: Four times a day (QID) | ORAL | 0 refills | Status: DC

## 2021-02-11 MED ORDER — PANTOPRAZOLE SODIUM 40 MG PO TBEC: 40.0000 mg | DELAYED_RELEASE_TABLET | Freq: Every day | ORAL | 1 refills | Status: DC

## 2021-02-11 NOTE — ED Provider Notes (Signed)
[facility] Emergency Department Provider Note  Time seen: 7:35 PM  I have reviewed the triage vital signs and the nursing notes.   HISTORY  Chief Complaint Abdominal pain  HPI Janet Lynn is a 50 y.o. female with a past medical history of migraines, presents emergency department for upper abdominal pain.  According to the patient over the past 4 days or so she has been experiencing pain across the upper abdomen described more as a cramping or burning type pain.  Patient states nausea but no vomiting.  No diarrhea.  No fever.  Patient does state a history of heartburn and has been worse over the past several days as well.  Patient has noticed some association with eating 1 to 2 hours after eating notices that the pain worsens.  Still has her gallbladder.  States mild to moderate 5/10 dull pain currently.   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 Authorizing Provider  acetaminophen (TYLENOL) 500 MG tablet Take 500 mg by mouth every 6 (six) hours as needed.    [provider]  DM-Phenylephrine-Acetaminophen (VICKS DAYQUIL COLD & FLU PO) Take by mouth every 4 (four) hours as needed.    [provider]  fluticasone (FLONASE) 50 MCG/ACT nasal spray Place 2 sprays into both nostrils daily. Patient not taking: Reported on  12/25/2019 07/15/17   Ria Bush, MD    Allergies  Allergen Reactions   Erythromycin     REACTION: rash    Family History  Problem Relation Age of Onset   Hypertension Mother    Diabetes Mother    Colon cancer Neg Hx    Breast cancer Neg Hx     Social History Social History   Tobacco Use   Smoking status: Never   Smokeless tobacco: Never  Substance Use Topics   Alcohol use: No   Drug use: No    Review of Systems Constitutional: Negative for fever. Cardiovascular: Negative for chest pain. Respiratory: Negative for shortness of breath. Gastrointestinal: Dull upper abdominal pain.  Positive for nausea.  Negative for recent vomiting or diarrhea. Genitourinary: Negative for urinary compaints Musculoskeletal: Negative for musculoskeletal complaints Neurological: Negative for headache All other ROS negative  ____________________________________________   PHYSICAL EXAM:  VITAL SIGNS: ED Triage Vitals  Enc Vitals Group     BP 02/11/21 1746 (!) 145/100     Pulse Rate 02/11/21 1746 93     Resp 02/11/21 1746 20     Temp 02/11/21 1746 98 F (36.7 C)     Temp Source 02/11/21 1746 Oral     SpO2 02/11/21 1746 100 %     Weight 02/11/21 1749 175 lb (79.4 kg)     Height 02/11/21 1749 '5\' 1"'$  (1.549 m)  Head Circumference --      Peak Flow --      Pain Score 02/11/21 1748 6     Pain Loc --      Pain Edu? --      Excl. in [place]? --    Constitutional: Alert and oriented. Well appearing and in no distress. Eyes: Normal exam ENT      Head: Normocephalic and atraumatic.      Mouth/Throat: Mucous membranes are moist. Cardiovascular: Normal rate, regular rhythm.  Respiratory: Normal respiratory effort without tachypnea nor retractions. Breath sounds are clear  Gastrointestinal: Soft, mild upper abdominal tenderness palpation without rebound or guarding. Musculoskeletal: Nontender with normal range of motion in all extremities.  Neurologic:  Normal speech and language.  No gross focal neurologic deficits  Skin:  Skin is warm, dry and intact.  Psychiatric: Mood and affect are normal.   ____________________________________________    EKG  EKG viewed and interpreted by myself shows a normal sinus rhythm 88 bpm with a narrow QRS, normal axis, normal intervals, nonspecific ST changes.  ____________________________________________    RADIOLOGY  Ultrasound shows cholelithiasis with no signs of cholecystitis.  ____________________________________________   INITIAL IMPRESSION / ASSESSMENT AND PLAN / ED COURSE  Pertinent labs & imaging results that were available during my care of the patient were reviewed by me and considered in my medical decision making (see chart for details).   Patient presents emergency department for for 5 days of upper abdominal pain somewhat worse after eating food.  Also has noted increased heartburn over the past 4 5 days as well.  Patient does have mild tenderness across the upper abdomen both on the right upper quadrant epigastric and left upper quadrant.  Is mild.  Remainder the abdomen is benign without rebound guarding or distention.  Patient's labs have resulted largely within normal limits.  LFTs are largely normal.  Lipase is normal.  White blood cell count slightly elevated 11.9.  We will obtain a right upper quadrant ultrasound to further evaluate.  If the right upper quadrant ultrasound is normal anticipate likely discharge home treating for very likely gastritis and having the patient follow-up with GI medicine.  Patient agreeable to plan of care.  Ultrasound shows cholelithiasis with no cholecystitis.  Lab work largely reassuring including normal LFTs and normal lipase.  Highly suspect gastritis/reflux to be the cause of the patient's discomfort.  We will place the patient on Protonix for the next 2 months as well as Carafate to be taken as needed.  Discussed dietary precautions as well as return precautions as well as GI  follow-up.  Janet Lynn was evaluated in Emergency Department on 02/11/2021 for the symptoms described in the history of present illness. She was evaluated in the context of the global COVID-19 pandemic, which necessitated consideration that the patient might be at risk for infection with the SARS-CoV-2 virus that causes COVID-19. Institutional protocols and algorithms that pertain to the evaluation of patients at risk for COVID-19 are in a state of rapid change based on information released by regulatory bodies including the CDC and federal and state organizations. These policies and algorithms were followed during the patient's care in the ED.  ____________________________________________   FINAL CLINICAL IMPRESSION(S) / ED DIAGNOSES  Upper abdominal pain   Harvest Dark, MD 02/11/21 2107

## 2021-02-11 NOTE — ED Notes (Signed)
US at bedside

## 2021-02-11 NOTE — Telephone Encounter (Signed)
I'll await the ER note.

## 2021-02-11 NOTE — Telephone Encounter (Signed)
Spoke to patient by telephone and was advised that she has had abdominal pain off and on since Saturday. Patient stated that the pain is in the center of her stomach. Patient stated that she had similar pain several months ago that cleared up.  Patient stated that she is planning on going to [facility]  ER around 5:00 pm today to be evaluated.

## 2021-02-11 NOTE — Telephone Encounter (Signed)
Noted. Thanks.

## 2021-02-11 NOTE — ED Triage Notes (Signed)
Pt to ED POV c/o upper abdominal pain. Pt states it started Saturday morning that feels like cramping and stabbing pain. Pt states she is SOB, and upper back pain. Pt has had Nausea and vomiting, denies diarrhea.  Pt also states she has heart burn that has gone off and on since Saturday as well.   A&Ox4, NAD

## 2021-02-11 NOTE — Telephone Encounter (Signed)
PLEASE NOTE: All timestamps contained within this report are represented as [place] Standard Time. CONFIDENTIALTY NOTICE: This fax transmission is intended only for the addressee. It contains information that is legally privileged, confidential or otherwise protected from use or disclosure. If you are not the intended recipient, you are strictly prohibited from reviewing, disclosing, copying using or disseminating any of this information or taking any action in reliance on or regarding this information. If you have received this fax in error, please notify us immediately by telephone so that we can arrange for its return to [place]. Phone: [phone], Toll-Free: [phone], Fax: [phone] Page: 1 of 2 Call Id: [id number] [facility] - Client TELEPHONE ADVICE RECORD [facility] Patient Name: Janet Lynn [place] Gender: Female DOB: 1970-09-17 Age: 50 Y 3 M 16 D Return Phone Number: [phone] (Primary), [phone] (Secondary) Address: [place]  [address] Client [facility] - Client Client Site [facility] - Day Physician Renford Dills - MD Contact Type Call Who Is Calling Patient / Member / Family / Caregiver Call Type Triage / Clinical Relationship To Patient Self Return Phone Number [phone] (Primary) Chief Complaint SEVERE ABDOMINAL PAIN - Severe pain in abdomen Reason for Call Symptomatic / Request for [place] states is Janet Lynn with the Dr's office - Pt called to schedule appt for upper severe abdominal pain and today she broke out in a sweat. Translation No Nurse Assessment Nurse: Laurence Compton, RN, Horris Latino Date/Time (Eastern Time): 02/11/2021 3:15:13 PM Confirm and document reason for call. If symptomatic, describe symptoms. ---Pt called to schedule appt for severe upper abdominal pain and today she broke out in a sweat. Has nausea. Started Saturday.  Had something similar in August for two days. No medications taken. Does the patient have any new or worsening symptoms? ---Yes Will a triage be completed? ---Yes Related visit to physician within the last 2 weeks? ---No Does the PT have any chronic conditions? (i.e. diabetes, asthma, this includes High risk factors for pregnancy, etc.) ---No Is the patient pregnant or possibly pregnant? (Ask all females between the ages of 61-55) ---No Is this a behavioral health or substance abuse call? ---No Guidelines Guideline Title Affirmed Question Affirmed Notes Nurse Date/Time (Eastern Time) Abdominal Pain - Upper [1] SEVERE pain (e.g., excruciating) AND [2] present > 1 hour Du, RN, Horris Latino 02/11/2021 3:17:30 PM PLEASE NOTE: All timestamps contained within this report are represented as [place] Standard Time. CONFIDENTIALTY NOTICE: This fax transmission is intended only for the addressee. It contains information that is legally privileged, confidential or otherwise protected from use or disclosure. If you are not the intended recipient, you are strictly prohibited from reviewing, disclosing, copying using or disseminating any of this information or taking any action in reliance on or regarding this information. If you have received this fax in error, please notify us immediately by telephone so that we can arrange for its return to [place]. Phone: [phone], Toll-Free: [phone], Fax: [phone] Page: 2 of 2 Call Id: [id number] [place]. Time Eilene Ghazi Time) Disposition Final User 02/11/2021 3:12:55 PM Send to Urgent Tawanna Cooler 02/11/2021 3:19:38 PM Go to ED Now Yes Du, RN, Stefano Gaul Disagree/Comply Comply Caller Understands Yes PreDisposition InappropriateToAsk Care Advice Given Per Guideline GO TO ED NOW: * Leave now. Drive carefully. NOTHING BY MOUTH: * Do not eat or drink anything for now. * Reason: Condition may need surgery and general anesthesia. CARE ADVICE given per  Abdominal Pain,  Upper (Adult) guideline. Comments User: Theodoro Kalata, RN Date/Time (Eastern Time): 02/11/2021 3:20:43 PM Caller states pain is an 8 on 0-10 pain scale Referrals [facility] - ED

## 2021-02-24 ENCOUNTER — Encounter: Payer: Self-pay | Admitting: Family Medicine

## 2021-02-24 ENCOUNTER — Other Ambulatory Visit: Payer: Self-pay

## 2021-02-24 ENCOUNTER — Ambulatory Visit (INDEPENDENT_AMBULATORY_CARE_PROVIDER_SITE_OTHER): Payer: Self-pay | Admitting: Family Medicine

## 2021-02-24 VITALS — BP 118/84 | HR 60 | Temp 97.6°F | Ht 61.0 in | Wt 200.0 lb

## 2021-02-24 DIAGNOSIS — Z1211 Encounter for screening for malignant neoplasm of colon: Secondary | ICD-10-CM

## 2021-02-24 DIAGNOSIS — K219 Gastro-esophageal reflux disease without esophagitis: Secondary | ICD-10-CM

## 2021-02-24 DIAGNOSIS — R109 Unspecified abdominal pain: Secondary | ICD-10-CM

## 2021-02-24 NOTE — Progress Notes (Signed)
This visit occurred during the SARS-CoV-2 public health emergency.  Safety protocols were in place, including screening questions prior to the visit, additional usage of staff PPE, and extensive cleaning of exam room while observing appropriate contact time as indicated for disinfecting solutions.  She had ER eval for abd pain.  B upper abdomen.  Had been going on for a few days prior to eval.  Had some nausea and sweats.  Some back in the back.  Doesn't have h/o ulcers.  Doesn't smoking.  No etoh.  No hematemesis.  Was regurgitating some acidic material.  No black or bloody stools.  Abd pain is better now, with occ sx that are less bad.  She had been eating bland foods.  She is drinking diet mtn dew with caffeine.    [place] with  1. Cholelithiasis without acute cholecystitis. 2. Mild hepatic steatosis.  D/w pt about GB path/phys and fatty liver.  She is still on PPI.    Meds, vitals, and allergies reviewed.   ROS: Per HPI unless specifically indicated in ROS section   GEN: nad, alert and oriented HEENT: ncat NECK: supple w/o LA CV: rrr.   PULM: ctab, no inc wob ABD: soft, +bs, not ttp EXT: no edema SKIN: no acute rash, no jaundice.   Skin well perfused.

## 2021-02-24 NOTE — Patient Instructions (Addendum)
Taper caffeine and we'll call about seeing the GI clinic.  If you want to see the general surgeons then let me know.   Take care.  Glad to see you.  Finish the protonix.    Recheck labs prior to a physical in the spring.

## 2021-02-25 DIAGNOSIS — R109 Unspecified abdominal pain: Secondary | ICD-10-CM | POA: Insufficient documentation

## 2021-02-25 NOTE — Assessment & Plan Note (Signed)
ER evaluation and labs discussed with patient.  Normal lipase.  Ultrasound discussed with patient about cholelithiasis and mild hepatic steatosis.  No sign of cholecystitis.  It does not appear that cholelithiasis is symptomatic.  Discussed options.  Discussed diet and anatomy.  Discussed issues related to gallstones and eating fatty foods.  Discussed hepatic steatosis.  Discussed diet changes  Advised to taper caffeine and refer to GI clinic.  We can refer to general surgery if she desires.  It does not appear that is an urgent issue.  Advised to avoid fatty foods in the meantime. Finish the protonix.   Recheck labs prior to a physical in the spring.  She agrees to plan.

## 2021-03-02 ENCOUNTER — Telehealth: Payer: Self-pay

## 2021-03-02 NOTE — Telephone Encounter (Signed)
Pt. Calling to schedule colonoscopy 

## 2021-03-02 NOTE — Telephone Encounter (Signed)
Returned patients call. Patient expressed she would like to schedule EGD as well. Informed patient she would need an office visit. Visit has been scheduled to discuss colonoscopy/EGD.

## 2021-04-06 ENCOUNTER — Ambulatory Visit: Payer: BC Managed Care – PPO | Admitting: Gastroenterology

## 2021-04-06 ENCOUNTER — Other Ambulatory Visit: Payer: Self-pay

## 2021-04-06 ENCOUNTER — Encounter: Payer: Self-pay | Admitting: Gastroenterology

## 2021-04-06 VITALS — BP 131/94 | HR 83 | Temp 99.0°F | Ht 61.0 in | Wt 199.2 lb

## 2021-04-06 DIAGNOSIS — E669 Obesity, unspecified: Secondary | ICD-10-CM | POA: Insufficient documentation

## 2021-04-06 DIAGNOSIS — N926 Irregular menstruation, unspecified: Secondary | ICD-10-CM | POA: Insufficient documentation

## 2021-04-06 DIAGNOSIS — K8020 Calculus of gallbladder without cholecystitis without obstruction: Secondary | ICD-10-CM | POA: Diagnosis not present

## 2021-04-06 DIAGNOSIS — Z1211 Encounter for screening for malignant neoplasm of colon: Secondary | ICD-10-CM

## 2021-04-06 DIAGNOSIS — R1010 Upper abdominal pain, unspecified: Secondary | ICD-10-CM

## 2021-04-06 MED ORDER — NA SULFATE-K SULFATE-MG SULF 17.5-3.13-1.6 GM/177ML PO SOLN: 1.0000 | Freq: Once | ORAL | 0 refills | Status: AC

## 2021-04-06 NOTE — Progress Notes (Signed)
Cephas Darby, MD [address]  [place]  [place], [place] [address]  Main: [phone]  Fax: [phone]    Gastroenterology Consultation  Referring Provider:     Tonia Ghent, MD Primary Care Physician:  Tonia Ghent, MD Primary Gastroenterologist:  Dr. Cephas Darby Reason for Consultation:     Upper abdominal pain, discuss about colonoscopy        HPI:   Janet Lynn is a 50 y.o. female referred by Dr. Tonia Ghent, MD  for consultation & management of upper abdominal pain.  Patient went to ER on 9/14 secondary to acute episode of upper abdominal pain, sharp and sudden onset, associated with nausea and vomiting, pain radiating to her shoulder blades.  Patient denies heartburn.  She had mild leukocytosis, normal serum lipase, LFTs, urinalysis, pregnancy test.  She underwent ultrasound abdomen which revealed cholelithiasis and fatty liver.  Patient was given Protonix 40 mg daily.  She reports that she had another episode day before yesterday she woke up in the morning with similar episode of sharp pain across her upper abdomen radiating to the shoulder.  She had cheese hamburger and sandwich the day before.  Patient is referred to GI to evaluate for upper endoscopy as well as colonoscopy.  Patient is currently asymptomatic.  She reports having regular bowel movements  She does not smoke or drink alcohol  NSAIDs: None  Antiplts/Anticoagulants/Anti thrombotics: None  GI Procedures: None She denies family history of GI malignancy  Past Medical History:  Diagnosis Date   Allergy    Migraine with aura     Past Surgical History:  Procedure Laterality Date   APPENDECTOMY  1987   BREAST REDUCTION SURGERY     THYROID SURGERY  1989   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   TONSILLECTOMY     TUBAL LIGATION     bilateral    Current Outpatient Medications:    acetaminophen (TYLENOL) 500 MG tablet, Take 500 mg by mouth every 6 (six) hours as needed.,  Disp: , Rfl:    Na Sulfate-K Sulfate-Mg Sulf 17.5-3.13-1.6 GM/177ML SOLN, Take 1 kit by mouth once for 1 dose., Disp: 354 mL, Rfl: 0   pantoprazole (PROTONIX) 40 MG tablet, Take 1 tablet (40 mg total) by mouth daily., Disp: 30 tablet, Rfl: 1    Family History  Problem Relation Age of Onset   Hypertension Mother    Diabetes Mother    Colon cancer Neg Hx    Breast cancer Neg Hx      Social History   Tobacco Use   Smoking status: Never   Smokeless tobacco: Never  Substance Use Topics   Alcohol use: No   Drug use: No    Allergies as of 04/06/2021 - Review Complete 04/06/2021  Allergen Reaction Noted   Erythromycin  11/07/2007    Review of Systems:    All systems reviewed and negative except where noted in HPI.   Physical Exam:  BP (!) 131/94 (BP Location: Left Arm, Patient Position: Sitting, Cuff Size: Normal)   Pulse 83   Temp 99 F (37.2 C) (Oral)   Ht 5' 1"  (1.549 m)   Wt 199 lb 3.2 oz (90.4 kg)   BMI 37.64 kg/m  No LMP recorded.  General:   Alert,  Well-developed, well-nourished, pleasant and cooperative in NAD Head:  Normocephalic and atraumatic. Eyes:  Sclera clear, no icterus.   Conjunctiva pink. Ears:  Normal auditory acuity. Nose:  No deformity,  discharge, or lesions. Mouth:  No deformity or lesions,oropharynx pink & moist. Neck:  Supple; no masses or thyromegaly. Lungs:  Respirations even and unlabored.  Clear throughout to auscultation.   No wheezes, crackles, or rhonchi. No acute distress. Heart:  Regular rate and rhythm; no murmurs, clicks, rubs, or gallops. Abdomen:  Normal bowel sounds. Soft, non-tender and non-distended without masses, hepatosplenomegaly or hernias noted.  No guarding or rebound tenderness.   Rectal: Not performed Msk:  Symmetrical without gross deformities. Good, equal movement & strength bilaterally. Pulses:  Normal pulses noted. Extremities:  No clubbing or edema.  No cyanosis. Neurologic:  Alert and oriented x3;  grossly normal  neurologically. Skin:  Intact without significant lesions or rashes. No jaundice. Psych:  Alert and cooperative. Normal mood and affect.  Imaging Studies: Reviewed  Assessment and Plan:   Janet Lynn is a 50 y.o. female with BMI 37.64, is seen in consultation for two discrete episodes of sharp upper abdominal pain radiating to right shoulder associated with nausea and vomiting, imaging revealed cholelithiasis  Patient likely has symptomatic cholelithiasis Recommend referral to general surgery to evaluate for cholecystectomy In the meantime, continue Protonix 40 mg daily before breakfast We will schedule upper endoscopy to evaluate for peptic ulcer disease, H. pylori gastritis or erosive esophagitis  Colon cancer screening Discussed about screening colonoscopy  I have discussed alternative options, risks & benefits,  which include, but are not limited to, bleeding, infection, perforation,respiratory complication & drug reaction.  The patient agrees with this plan & written consent will be obtained.     Follow up based on above work-up   Cephas Darby, MD

## 2021-04-06 NOTE — Progress Notes (Signed)
Sent in referral to general surgery

## 2021-04-22 ENCOUNTER — Ambulatory Visit: Payer: BC Managed Care – PPO | Admitting: Surgery

## 2021-04-29 ENCOUNTER — Ambulatory Visit: Payer: BC Managed Care – PPO | Admitting: Surgery

## 2021-04-29 ENCOUNTER — Other Ambulatory Visit: Payer: Self-pay

## 2021-04-29 ENCOUNTER — Encounter: Payer: Self-pay | Admitting: Surgery

## 2021-04-29 ENCOUNTER — Telehealth: Payer: Self-pay | Admitting: Surgery

## 2021-04-29 VITALS — BP 147/80 | HR 72 | Temp 98.1°F | Ht 61.0 in | Wt 201.8 lb

## 2021-04-29 DIAGNOSIS — K8020 Calculus of gallbladder without cholecystitis without obstruction: Secondary | ICD-10-CM | POA: Diagnosis not present

## 2021-04-29 NOTE — Progress Notes (Signed)
04/29/2021  Reason for Visit:  Symptomatic cholelithiasis  Requesting Provider:  Sherri Sear, MD  History of Present Illness: Janet Lynn is a 50 y.o. female presenting for evaluation of symptomatic cholelithiasis.  The patient presented to the emergency room on 02/11/2021 with abdominal pain she had an ultrasound that showed cholelithiasis.  She was referred to GI for her upper abdominal pain and was given a prescription for Protonix and Carafate.  She has seen Dr. Marius Ditch but her symptoms were more consistent with cholelithiasis and was referred to [place] for further evaluation.  As a precaution she is scheduled for EGD and also colonoscopy with Dr. Marius Ditch on 05/12/2021.  The patient reports that she has had a few episodes of biliary colic over the last few months but has been doing well recently.  Her episodes consist of upper abdominal and right upper quadrant pain radiates towards the right shoulder.  Denies any nausea or vomiting.  Her last episode was a couple of weeks ago and this happened after eating a cheeseburger.  She reports that the Protonix that she was given in the emergency room has not really helped much and she stopped taking it because of that.  Currently denies any fevers, chills, chest pain, shortness of breath, nausea, vomiting.  Past Medical History: Past Medical History:  Diagnosis Date   Acute sinusitis 07/23/2010   Allergy    Migraine with aura      Past Surgical History: Past Surgical History:  Procedure Laterality Date   APPENDECTOMY  1987   BREAST REDUCTION SURGERY     THYROID SURGERY  1989   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   TONSILLECTOMY     TUBAL LIGATION     bilateral    Home Medications: Prior to Admission medications   None    Allergies: Allergies  Allergen Reactions   Erythromycin     REACTION: rash    Social History:  reports that she has never smoked. She has never used smokeless tobacco. She reports that she does not drink alcohol  and does not use drugs.   Family History: Family History  Problem Relation Age of Onset   Hypertension Mother    Diabetes Mother    Colon cancer Neg Hx    Breast cancer Neg Hx     Review of Systems: Review of Systems  Constitutional:  Negative for chills and fever.  HENT:  Negative for hearing loss.   Respiratory:  Negative for shortness of breath.   Cardiovascular:  Negative for chest pain.  Gastrointestinal:  Positive for abdominal pain. Negative for constipation, diarrhea, nausea and vomiting.  Genitourinary:  Negative for dysuria.  Musculoskeletal:  Negative for myalgias.  Skin:  Negative for rash.  Neurological:  Negative for dizziness.  Psychiatric/Behavioral:  Negative for depression.    Physical Exam BP (!) 147/80   Pulse 72   Temp 98.1 F (36.7 C) (Oral)   Ht 5\' 1"  (1.549 m)   Wt 201 lb 12.8 oz (91.5 kg)   SpO2 97%   BMI 38.13 kg/m  CONSTITUTIONAL: No acute distress, well-nourished HEENT:  Normocephalic, atraumatic, extraocular motion intact. NECK: Trachea is midline, and there is no jugular venous distension.  RESPIRATORY:  Lungs are clear, and breath sounds are equal bilaterally. Normal respiratory effort without pathologic use of accessory muscles. CARDIOVASCULAR: Heart is regular without murmurs, gallops, or rubs. GI: The abdomen is soft, nondistended, with some mild discomfort in the upper abdomen and right upper quadrant.  Negative Murphy sign.  She has a well-healed open appendectomy scar.  MUSCULOSKELETAL:  Normal muscle strength and tone in all four extremities.  No peripheral edema or cyanosis. SKIN: Skin turgor is normal. There are no pathologic skin lesions.  NEUROLOGIC:  Motor and sensation is grossly normal.  Cranial nerves are grossly intact. PSYCH:  Alert and oriented to person, place and time. Affect is normal.  Laboratory Analysis: Labs from 02/11/2021: Sodium 135, potassium 3.8, chloride 99, CO2 28, BUN 20, creatinine 1.02.  Total bilirubin  0.5, AST 19, ALT 19, alkaline phosphatase 72, lipase 35, albumin 3.9.  WBC 11.9, hemoglobin 14, hematocrit 42.3, platelets 407.  Imaging: Ultrasound RUQ on 02/11/2021: IMPRESSION: 1. Cholelithiasis without acute cholecystitis. 2. Mild hepatic steatosis.  Assessment and Plan: This is a 50 y.o. female with symptomatic cholelithiasis with a few episodes of biliary colic over the last few months.  -Discussed with the patient that I would agree with Dr. Marius Ditch that her symptoms are likely related to her cholelithiasis.  I think is appropriate as a precaution to do an EGD and she is currently at screening age for colonoscopy as well.  Reviewed with her the potential options for conservative measures with a low-fat diet versus surgical intervention.  She would rather proceed with surgical intervention.  However she would also want to wait until January 2023 to do her surgery.  She currently scheduled for her EGD and colonoscopy on 05/12/2021 with Dr. Marius Ditch and would rather not have surgery so close to Christmas.  Given that she has only had a few episodes of biliary colic rather than daily constant pain, I think is okay to wait until then. - Reviewed with her the plan for robotic cholecystectomy and discussed with her the risks of bleeding, infection, injury to surrounding structures, this is an outpatient procedure, postoperative recovery and pain control and activity restrictions, and she is willing to proceed.  We will schedule her for 06/02/2021.  I spent 80 minutes dedicated to the care of this patient on the date of this encounter to include pre-visit review of records, face-to-face time with the patient discussing diagnosis and management, and any post-visit coordination of care.   Melvyn Neth, [facility]

## 2021-04-29 NOTE — Patient Instructions (Addendum)
Our surgery scheduler Pamala Hurry will call you within 24-48 hours to get you scheduled. If you have not heard from her after 48 hours, please call our office. You will not need to get Covid tested before surgery and have the blue sheet available when she calls to write down important information.   If you have any concerns or questions, please feel free to call our office.   Cholelithiasis Cholelithiasis happens when gallstones form in the gallbladder. The gallbladder stores bile. Bile is a fluid that helps digest fats. Bile can harden and form into gallstones. If they cause a blockage, they can cause pain (gallbladder attack). What are the causes? This condition may be caused by: Some blood diseases, such as sickle cell anemia. Too much of a fat-like substance (cholesterol) in your bile. Not enough bile salts in your bile. These salts help the body absorb and digest fats. The gallbladder not emptying fully or often enough. This is common in pregnant women. What increases the risk? The following factors may make you more likely to develop this condition: Being female. Being pregnant many times. Eating a lot of fried foods, fat, and refined carbs (refined carbohydrates). Being very overweight (obese). Being older than age 13. Using medicines with female hormones in them for a long time. Losing weight fast. Having gallstones in your family. Having some health problems, such as diabetes, Crohn's disease, or liver disease. What are the signs or symptoms? Often, there may be gallstones but no symptoms. These gallstones are called silent gallstones. If a gallstone causes a blockage, you may get sudden pain. The pain: Can be in the upper right part of your belly (abdomen). Normally comes at night or after you eat. Can last an hour or more. Can spread to your right shoulder, back, or chest. Can feel like discomfort, burning, or fullness in the upper part of your belly (indigestion). If the  blockage lasts more than a few hours, you can get an infection or swelling. You may: Feel like you may vomit. Vomit. Feel bloated. Have belly pain for 5 hours or more. Feel tender in your belly, often in the upper right part and under your ribs. Have fever or chills. Have skin or the white parts of your eyes turn yellow (jaundice). Have dark pee (urine) or pale poop (stool). How is this treated? Treatment for this condition depends on how bad you feel. If you have symptoms, you may need: Home care, if symptoms are not very bad. Do not eat for 12-24 hours. Drink only water and clear liquids. Start to eat simple or clear foods after 1 or 2 days. Try broths and crackers. You may need medicines for pain or stomach upset or both. If you have an infection, you will need antibiotics. A hospital stay, if you have very bad pain or a very bad infection. Surgery to remove your gallbladder. You may need this if: Gallstones keep coming back. You have very bad symptoms. Medicines to break up gallstones. Medicines: Are best for small gallstones. May be used for up to 6-12 months. A procedure to find and take out gallstones or to break up gallstones. Follow these instructions at home: Medicines Take over-the-counter and prescription medicines only as told by your doctor. If you were prescribed an antibiotic medicine, take it as told by your doctor. Do not stop taking the antibiotic even if you start to feel better. Ask your doctor if the medicine prescribed to you requires you to avoid driving or using machinery.  Eating and drinking Drink enough fluid to keep your urine pale yellow. Drink water or clear fluids. This is important when you have pain. Eat healthy foods. Choose: Fewer fatty foods, such as fried foods. Fewer refined carbs. Avoid breads and grains that are highly processed, such as white bread and white rice. Choose whole grains, such as whole-wheat bread and brown rice. More fiber.  Almonds, fresh fruit, and beans are healthy sources. General instructions Keep a healthy weight. Keep all follow-up visits as told by your doctor. This is important. Where to find more information [employer] of Diabetes and Digestive and Kidney Diseases: [email or web address] Contact a doctor if: You have sudden pain in the upper right part of your belly. Pain might spread to your right shoulder, back, or chest. You have been diagnosed with gallstones that have no symptoms and you get: Belly pain. Discomfort, burning, or fullness in the upper part of your abdomen. You have dark urine or pale stools. Get help right away if: You have sudden pain in the upper right part of your abdomen, and the pain lasts more than 2 hours. You have pain in your abdomen, and: It lasts more than 5 hours. It keeps getting worse. You have a fever or chills. You keep feeling like you may vomit. You keep vomiting. Your skin or the white parts of your eyes turn yellow. Summary Cholelithiasis happens when gallstones form in the gallbladder. This condition may be caused by a blood disease, too much of a fat-like substance in the bile, or not enough bile salts in bile. Treatment for this condition depends on how bad you feel. If you have symptoms, do not eat or drink. You may need medicines. You may need a hospital stay for very bad pain or a very bad infection. You may need surgery if gallstones keep coming back or if you have very bad symptoms. This information is not intended to replace advice given to you by your health care provider. Make sure you discuss any questions you have with your health care provider.  Gallbladder Eating Plan If you have a gallbladder condition, you may have trouble digesting fats. Eating a low-fat diet can help reduce your symptoms, and may be helpful before and after having surgery to remove your gallbladder (cholecystectomy). Your health care provider may recommend that you work  with a diet and nutrition specialist (dietitian) to help you reduce the amount of fat in your diet. What are tips for following this plan? General guidelines Limit your fat intake to less than 30% of your total daily calories. If you eat around 1,800 calories each day, this is less than 60 grams (g) of fat per day. Fat is an important part of a healthy diet. Eating a low-fat diet can make it hard to maintain a healthy body weight. Ask your dietitian how much fat, calories, and other nutrients you need each day. Eat small, frequent meals throughout the day instead of three large meals. Drink at least 8-10 cups of fluid a day. Drink enough fluid to keep your urine clear or pale yellow. Limit alcohol intake to no more than 1 drink a day for nonpregnant women and 2 drinks a day for men. One drink equals 12 oz of beer, 5 oz of wine, or 1 oz of hard liquor. Reading food labels  Check Nutrition Facts on food labels for the amount of fat per serving. Choose foods with less than 3 grams of fat per serving. Shopping Choose nonfat and low-fat  healthy foods. Look for the words "nonfat," "low fat," or "fat free." Avoid buying processed or prepackaged foods. Cooking Cook using low-fat methods, such as baking, broiling, grilling, or boiling. Cook with small amounts of healthy fats, such as olive oil, grapeseed oil, canola oil, or sunflower oil. What foods are recommended? All fresh, frozen, or canned fruits and vegetables. Whole grains. Low-fat or non-fat (skim) milk and yogurt. Lean meat, skinless poultry, fish, eggs, and beans. Low-fat protein supplement powders or drinks. Spices and herbs. What foods are not recommended? High-fat foods. These include baked goods, fast food, fatty cuts of meat, ice cream, french toast, sweet rolls, pizza, cheese bread, foods covered with butter, creamy sauces, or cheese. Fried foods. These include french fries, tempura, battered fish, breaded chicken, fried breads, and  sweets. Foods with strong odors. Foods that cause bloating and gas. Summary A low-fat diet can be helpful if you have a gallbladder condition, or before and after gallbladder surgery. Limit your fat intake to less than 30% of your total daily calories. This is about 60 g of fat if you eat 1,800 calories each day. Eat small, frequent meals throughout the day instead of three large meals. This information is not intended to replace advice given to you by your health care provider. Make sure you discuss any questions you have with your health care provider.

## 2021-04-29 NOTE — Telephone Encounter (Signed)
Patient has been advised of Pre-Admission date/time, COVID Testing date and Surgery date.  Surgery Date: 06/02/21 Preadmission Testing Date: 05/29/21 (phone 8am-1:00 pm) Covid Testing Date: Not needed.   Patient has been made aware to call [phone], between 1-3:00pm the day before surgery, to find out what time to arrive for surgery.

## 2021-05-11 ENCOUNTER — Encounter: Payer: Self-pay | Admitting: Gastroenterology

## 2021-05-12 ENCOUNTER — Encounter: Payer: Self-pay | Admitting: Gastroenterology

## 2021-05-12 ENCOUNTER — Other Ambulatory Visit: Payer: Self-pay

## 2021-05-12 ENCOUNTER — Ambulatory Visit: Payer: BC Managed Care – PPO | Admitting: Anesthesiology

## 2021-05-12 ENCOUNTER — Ambulatory Visit
Admission: RE | Admit: 2021-05-12 | Discharge: 2021-05-12 | Disposition: A | Discharge status: Home / Self Care | Payer: BC Managed Care – PPO | Source: Ambulatory Visit | Attending: Gastroenterology | Admitting: Gastroenterology

## 2021-05-12 ENCOUNTER — Encounter
Admission: RE | Disposition: A | Discharge status: Home / Self Care | Payer: Self-pay | Source: Ambulatory Visit | Attending: Gastroenterology

## 2021-05-12 DIAGNOSIS — K5730 Diverticulosis of large intestine without perforation or abscess without bleeding: Secondary | ICD-10-CM | POA: Diagnosis not present

## 2021-05-12 DIAGNOSIS — D122 Benign neoplasm of ascending colon: Secondary | ICD-10-CM | POA: Diagnosis not present

## 2021-05-12 DIAGNOSIS — D124 Benign neoplasm of descending colon: Secondary | ICD-10-CM | POA: Insufficient documentation

## 2021-05-12 DIAGNOSIS — Z1211 Encounter for screening for malignant neoplasm of colon: Secondary | ICD-10-CM | POA: Insufficient documentation

## 2021-05-12 DIAGNOSIS — Q438 Other specified congenital malformations of intestine: Secondary | ICD-10-CM | POA: Insufficient documentation

## 2021-05-12 DIAGNOSIS — R519 Headache, unspecified: Secondary | ICD-10-CM | POA: Diagnosis not present

## 2021-05-12 DIAGNOSIS — K635 Polyp of colon: Secondary | ICD-10-CM | POA: Diagnosis not present

## 2021-05-12 DIAGNOSIS — K2100 Gastro-esophageal reflux disease with esophagitis, without bleeding: Secondary | ICD-10-CM | POA: Insufficient documentation

## 2021-05-12 DIAGNOSIS — R1011 Right upper quadrant pain: Secondary | ICD-10-CM | POA: Diagnosis not present

## 2021-05-12 DIAGNOSIS — K2210 Ulcer of esophagus without bleeding: Secondary | ICD-10-CM | POA: Diagnosis not present

## 2021-05-12 DIAGNOSIS — D126 Benign neoplasm of colon, unspecified: Secondary | ICD-10-CM | POA: Diagnosis not present

## 2021-05-12 DIAGNOSIS — Z0000 Encounter for general adult medical examination without abnormal findings: Secondary | ICD-10-CM

## 2021-05-12 DIAGNOSIS — R1010 Upper abdominal pain, unspecified: Secondary | ICD-10-CM

## 2021-05-12 DIAGNOSIS — R1013 Epigastric pain: Secondary | ICD-10-CM | POA: Insufficient documentation

## 2021-05-12 DIAGNOSIS — K449 Diaphragmatic hernia without obstruction or gangrene: Secondary | ICD-10-CM | POA: Diagnosis not present

## 2021-05-12 HISTORY — PX: ESOPHAGOGASTRODUODENOSCOPY (EGD) WITH PROPOFOL: SHX5813

## 2021-05-12 HISTORY — PX: COLONOSCOPY WITH PROPOFOL: SHX5780

## 2021-05-12 LAB — POCT PREGNANCY, URINE: Preg Test, Ur: NEGATIVE

## 2021-05-12 SURGERY — COLONOSCOPY WITH PROPOFOL
Anesthesia: General

## 2021-05-12 MED ORDER — DEXMEDETOMIDINE HCL IN NACL 200 MCG/50ML IV SOLN
INTRAVENOUS | Status: DC | PRN
  Administered 2021-05-12: 8 ug via INTRAVENOUS

## 2021-05-12 MED ORDER — PROPOFOL 500 MG/50ML IV EMUL
INTRAVENOUS | Status: DC | PRN
  Administered 2021-05-12: 140 ug/kg/min via INTRAVENOUS

## 2021-05-12 MED ORDER — SODIUM CHLORIDE 0.9 % IV SOLN: INTRAVENOUS | Status: DC

## 2021-05-12 MED ORDER — OMEPRAZOLE 40 MG PO CPDR: 40.0000 mg | DELAYED_RELEASE_CAPSULE | Freq: Two times a day (BID) | ORAL | 2 refills | Status: DC

## 2021-05-12 MED ORDER — PROPOFOL 10 MG/ML IV BOLUS
INTRAVENOUS | Status: DC | PRN
  Administered 2021-05-12: 80 mg via INTRAVENOUS

## 2021-05-12 MED ORDER — LIDOCAINE HCL (CARDIAC) PF 100 MG/5ML IV SOSY
PREFILLED_SYRINGE | INTRAVENOUS | Status: DC | PRN
  Administered 2021-05-12: 100 mg via INTRAVENOUS

## 2021-05-12 NOTE — Op Note (Signed)
[facility] Patient Name: Janet Lynn Procedure Date: 05/12/2021 9:10 AM MRN: [id number] Account #: [id number] Date of Birth: 03-09-1971 Admit Type: Outpatient Age: 50 Room: [facility] ENDO ROOM 3 Gender: Female Note Status: Finalized Instrument Name: Jasper Riling [id number] Procedure:             Colonoscopy Indications:           Screening for colorectal malignant neoplasm, This is                         the patient's first colonoscopy Providers:             Lin Landsman MD, MD Medicines:             General Anesthesia Complications:         No immediate complications. Estimated blood loss: None. Procedure:             Pre-Anesthesia Assessment:                        - Prior to the procedure, a History and Physical was                         performed, and patient medications and allergies were                         reviewed. The patient is competent. The risks and                         benefits of the procedure and the sedation options and                         risks were discussed with the patient. All questions                         were answered and informed consent was obtained.                         Patient identification and proposed procedure were                         verified by the physician, the nurse, the                         anesthesiologist, the anesthetist and the technician                         in the pre-procedure area in the procedure room in the                         endoscopy suite. Mental Status Examination: alert and                         oriented. Airway Examination: normal oropharyngeal                         airway and neck mobility. Respiratory Examination:                         clear to auscultation.  CV Examination: normal.                         Prophylactic Antibiotics: The patient does not require                         prophylactic antibiotics. Prior Anticoagulants: The                          patient has taken no previous anticoagulant or                         antiplatelet agents. ASA Grade Assessment: II - A                         patient with mild systemic disease. After reviewing                         the risks and benefits, the patient was deemed in                         satisfactory condition to undergo the procedure. The                         anesthesia plan was to use general anesthesia.                         Immediately prior to administration of medications,                         the patient was re-assessed for adequacy to receive                         sedatives. The heart rate, respiratory rate, oxygen                         saturations, blood pressure, adequacy of pulmonary                         ventilation, and response to care were monitored                         throughout the procedure. The physical status of the                         patient was re-assessed after the procedure.                        After obtaining informed consent, the colonoscope was                         passed under direct vision. Throughout the procedure,                         the patient's blood pressure, pulse, and oxygen                         saturations were monitored continuously. The  Colonoscope was introduced through the anus and                         advanced to the the terminal ileum, with                         identification of the appendiceal orifice and IC                         valve. The colonoscopy was performed with moderate                         difficulty due to significant looping and the                         patient's body habitus. Successful completion of the                         procedure was aided by applying abdominal pressure.                         The patient tolerated the procedure well. The quality                         of the bowel preparation was evaluated using the BBPS                          [facility] Bowel Preparation Scale) with scores of: Right                         Colon = 3, Transverse Colon = 3 and Left Colon = 3                         (entire mucosa seen well with no residual staining,                         small fragments of stool or opaque liquid). The total                         BBPS score equals 9. Findings:      The perianal and digital rectal examinations were normal. Pertinent       negatives include normal sphincter tone and no palpable rectal lesions.      The terminal ileum appeared normal.      A 5 mm polyp was found in the ascending colon. The polyp was sessile.       The polyp was removed with a cold snare. Resection and retrieval were       complete.      A 9 mm polyp was found in the proximal descending colon. The polyp was       flat. Preparations were made for mucosal resection. Saline was injected       to raise the lesion. Snare mucosal resection was performed. Resection       and retrieval were complete.      Multiple diverticula were found in the sigmoid colon.      The retroflexed view of the distal rectum and anal verge was normal and  showed no anal or rectal abnormalities. Impression:            - The examined portion of the ileum was normal.                        - One 5 mm polyp in the ascending colon, removed with                         a cold snare. Resected and retrieved.                        - One 9 mm polyp in the proximal descending colon,                         removed with mucosal resection. Resected and retrieved.                        - Diverticulosis in the sigmoid colon.                        - The distal rectum and anal verge are normal on                         retroflexion view.                        - Mucosal resection was performed. Resection and                         retrieval were complete. Recommendation:        - Discharge patient to home (with escort).                        - Resume  previous diet today.                        - Continue present medications.                        - Await pathology results.                        - Repeat colonoscopy in 5 years for surveillance. Procedure Code(s):     --- Professional ---                        [id number], Colonoscopy, flexible; with endoscopic mucosal                         resection                        45385, 73, Colonoscopy, flexible; with removal of                         tumor(s), polyp(s), or other lesion(s) by snare                         technique Diagnosis Code(s):     --- Professional ---  Z12.11, Encounter for screening for malignant neoplasm                         of colon                        K63.5, Polyp of colon                        K57.30, Diverticulosis of large intestine without                         perforation or abscess without bleeding CPT copyright 2019 American Medical Association. All rights reserved. The codes documented in this report are preliminary and upon coder review may  be revised to meet current compliance requirements. Dr. Ulyess Mort Lin Landsman MD, MD 05/12/2021 9:57:57 AM This report has been signed electronically. Number of Addenda: 0 Note Initiated On: 05/12/2021 9:10 AM Scope Withdrawal Time: 0 hours 12 minutes 52 seconds  Total Procedure Duration: 0 hours 19 minutes 59 seconds  Estimated Blood Loss:  Estimated blood loss: none.      [facility]

## 2021-05-12 NOTE — Anesthesia Preprocedure Evaluation (Addendum)
Anesthesia Evaluation  Patient identified by MRN, date of birth, ID band Patient awake    Reviewed: Allergy & Precautions, H&P , NPO status , Patient's Chart, lab work & pertinent test results  Airway Mallampati: III  TM Distance: >3 FB Neck ROM: Full    Dental no notable dental hx.    Pulmonary neg pulmonary ROS,    Pulmonary exam normal        Cardiovascular negative cardio ROS Normal cardiovascular exam Rhythm:Regular Rate:Normal     Neuro/Psych  Headaches, negative psych ROS   GI/Hepatic Neg liver ROS, Bowel prep,Upper abdominal pain   Endo/Other  negative endocrine ROS  Renal/GU negative Renal ROS  negative genitourinary   Musculoskeletal negative musculoskeletal ROS (+)   Abdominal (+) + obese,   Peds negative pediatric ROS (+)  Hematology negative hematology ROS (+)   Anesthesia Other Findings   Reproductive/Obstetrics negative OB ROS                            Anesthesia Physical Anesthesia Plan  ASA: 2  Anesthesia Plan: General   Post-op Pain Management:    Induction: Intravenous  PONV Risk Score and Plan: TIVA  Airway Management Planned: Natural Airway and Simple Face Mask  Additional Equipment:   Intra-op Plan:   Post-operative Plan:   Informed Consent: I have reviewed the patients History and Physical, chart, labs and discussed the procedure including the risks, benefits and alternatives for the proposed anesthesia with the patient or authorized representative who has indicated his/her understanding and acceptance.     Dental advisory given  Plan Discussed with: CRNA and Anesthesiologist  Anesthesia Plan Comments:        Anesthesia Quick Evaluation

## 2021-05-12 NOTE — H&P (Signed)
  Cephas Darby, MD [address]  [place]  [place], [place] [address]  Main: [phone]  Fax: [phone] Pager: [phone]  Primary Care Physician:  Tonia Ghent, MD Primary Gastroenterologist:  Dr. Cephas Darby  Pre-Procedure History & Physical: HPI:  Janet Lynn is a 50 y.o. female is here for an endoscopy and colonoscopy.   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   TONSILLECTOMY     TUBAL LIGATION     bilateral    Prior to Admission medications   Not on File    Allergies as of 04/07/2021 - Review Complete 04/06/2021  Allergen Reaction Noted   Erythromycin  11/07/2007    Family History  Problem Relation Age of Onset   Hypertension Mother    Diabetes Mother    Colon cancer Neg Hx    Breast cancer Neg Hx     Social History   Socioeconomic History   Marital status: Married    Spouse name: Not on file   Number of children: 2   Years of education: Not on file   Highest education level: Not on file  Occupational History    Employer: [employer]  Tobacco Use   Smoking status: Never   Smokeless tobacco: Never  Vaping Use   Vaping Use: Never used  Substance and Sexual Activity   Alcohol use: No   Drug use: No   Sexual activity: Not on file  Other Topics Concern   Not on file  Social History Narrative   Married 1995 and lives with husband   2 boys   Works at [employer] Determinants of Radio broadcast assistant Strain: Not on file  Food Insecurity: Not on file  Transportation Needs: Not on file  Physical Activity: Not on file  Stress: Not on file  Social Connections: Not on file  Intimate Partner Violence: Not on file    Review of Systems: See HPI, otherwise negative  ROS  Physical Exam: BP (!) 137/95   Pulse 86   Temp (!) 97.5 F (36.4 C) (Temporal)   Resp 18   Ht 5\' 1"  (1.549 m)   Wt 90.7 kg   SpO2 99%   BMI 37.79 kg/m  General:   Alert,  pleasant and cooperative in NAD Head:  Normocephalic and atraumatic. Neck:  Supple; no masses or thyromegaly. Lungs:  Clear throughout to auscultation.    Heart:  Regular rate and rhythm. Abdomen:  Soft, nontender and nondistended. Normal bowel sounds, without guarding, and without rebound.   Neurologic:  Alert and  oriented x4;  grossly normal neurologically.  Impression/Plan: [employer] is here for an endoscopy and colonoscopy to be performed for upper abdominal pain and colon cancer screening  Risks, benefits, limitations, and alternatives regarding  endoscopy and colonoscopy have been reviewed with the patient.  Questions have been answered.  All parties agreeable.   Sherri Sear, MD  05/12/2021, 8:30 AM

## 2021-05-12 NOTE — Anesthesia Postprocedure Evaluation (Signed)
Anesthesia Post Note  Patient: [employer]chant [employer] officer  Procedure(s) Performed: COLONOSCOPY WITH PROPOFOL ESOPHAGOGASTRODUODENOSCOPY (EGD) WITH PROPOFOL  Patient location during evaluation: Endoscopy Anesthesia Type: General Level of consciousness: awake and alert Pain management: pain level controlled Vital Signs Assessment: post-procedure vital signs reviewed and stable Respiratory status: spontaneous breathing, nonlabored ventilation and respiratory function stable Cardiovascular status: blood pressure returned to baseline and stable Postop Assessment: no apparent nausea or vomiting Anesthetic complications: no   No notable events documented.   Last Vitals:  Vitals:   05/12/21 1010 05/12/21 1020  BP: 96/70 105/71  Pulse: 68 67  Resp: 13 10  Temp:    SpO2: 95% 100%    Last Pain:  Vitals:   05/12/21 1000  TempSrc: Temporal  PainSc:                  [place] Ouch

## 2021-05-12 NOTE — Op Note (Signed)
[facility] Patient Name: Janet Lynn Procedure Date: 05/12/2021 9:10 AM MRN: [id number] Account #: [id number] Date of Birth: Jul 22, 1970 Admit Type: Outpatient Age: 50 Room: [facility] ENDO ROOM 3 Gender: Female Note Status: Finalized Instrument Name: Upper Endoscope [id number] Procedure:             Upper GI endoscopy Indications:           Epigastric abdominal pain, Abdominal pain in the right                         upper quadrant Providers:             Lin Landsman MD, MD Medicines:             General Anesthesia Complications:         No immediate complications. Estimated blood loss: None. Procedure:             Pre-Anesthesia Assessment:                        - Prior to the procedure, a History and Physical was                         performed, and patient medications and allergies were                         reviewed. The patient is competent. The risks and                         benefits of the procedure and the sedation options and                         risks were discussed with the patient. All questions                         were answered and informed consent was obtained.                         Patient identification and proposed procedure were                         verified by the physician, the nurse, the                         anesthesiologist, the anesthetist and the technician                         in the pre-procedure area in the procedure room in the                         endoscopy suite. Mental Status Examination: alert and                         oriented. Airway Examination: normal oropharyngeal                         airway and neck mobility. Respiratory Examination:                         clear  to auscultation. CV Examination: normal.                         Prophylactic Antibiotics: The patient does not require                         prophylactic antibiotics. Prior Anticoagulants: The                          patient has taken no previous anticoagulant or                         antiplatelet agents. ASA Grade Assessment: II - A                         patient with mild systemic disease. After reviewing                         the risks and benefits, the patient was deemed in                         satisfactory condition to undergo the procedure. The                         anesthesia plan was to use general anesthesia.                         Immediately prior to administration of medications,                         the patient was re-assessed for adequacy to receive                         sedatives. The heart rate, respiratory rate, oxygen                         saturations, blood pressure, adequacy of pulmonary                         ventilation, and response to care were monitored                         throughout the procedure. The physical status of the                         patient was re-assessed after the procedure.                        After obtaining informed consent, the endoscope was                         passed under direct vision. Throughout the procedure,                         the patient's blood pressure, pulse, and oxygen                         saturations were monitored continuously. The Endoscope  was introduced through the mouth, and advanced to the                         second part of duodenum. The upper GI endoscopy was                         accomplished without difficulty. The patient tolerated                         the procedure well. Findings:      The duodenal bulb and second portion of the duodenum were normal.      A small hiatal hernia was present.      The entire examined stomach was normal. Biopsies were taken with a cold       forceps for Helicobacter pylori testing.      LA Grade B (one or more mucosal breaks greater than 5 mm, not extending       between the tops of two mucosal folds) esophagitis with no bleeding  was       found in the lower third of the esophagus. Random esophageal Biopsies       were taken with a cold forceps for histology.      Esophagogastric landmarks were identified: the gastroesophageal junction       was found at 34 cm from the incisors. Impression:            - Normal duodenal bulb and second portion of the                         duodenum.                        - Small hiatal hernia.                        - Normal stomach. Biopsied.                        - LA Grade B reflux esophagitis with no bleeding.                         Biopsied.                        - Esophagogastric landmarks identified. Recommendation:        - Await pathology results.                        - Discharge patient to home (with escort).                        - Follow an antireflux regimen.                        - Use Prilosec (omeprazole) 40 mg PO BID for 3 months.                        - Return to my office in 3 months.                        - Repeat upper endoscopy in 3 months to  check healing. Procedure Code(s):     --- Professional ---                        [phone], Esophagogastroduodenoscopy, flexible,                         transoral; with biopsy, single or multiple Diagnosis Code(s):     --- Professional ---                        K44.9, Diaphragmatic hernia without obstruction or                         gangrene                        R10.13, Epigastric pain                        R10.11, Right upper quadrant pain                        K21.00, Gastro-esophageal reflux disease with                         esophagitis, without bleeding CPT copyright 2019 American Medical Association. All rights reserved. The codes documented in this report are preliminary and upon coder review may  be revised to meet current compliance requirements. Dr. Ulyess Mort Lin Landsman MD, MD 05/12/2021 9:33:25 AM This report has been signed electronically. Number of Addenda: 0 Note  Initiated On: 05/12/2021 9:10 AM Estimated Blood Loss:  Estimated blood loss: none.      [facility]

## 2021-05-12 NOTE — Transfer of Care (Signed)
Immediate Anesthesia Transfer of Care Note  Patient: Janet Lynn  Procedure(s) Performed: COLONOSCOPY WITH PROPOFOL ESOPHAGOGASTRODUODENOSCOPY (EGD) WITH PROPOFOL  Patient Location: PACU  Anesthesia Type:General  Level of Consciousness: awake, alert  and oriented  Airway & Oxygen Therapy: Patient Spontanous Breathing  Post-op Assessment: Report given to RN and Post -op Vital signs reviewed and stable  Post vital signs: Reviewed and stable  Last Vitals:  Vitals Value Taken Time  BP    Temp    Pulse    Resp    SpO2      Last Pain:  Vitals:   05/12/21 0822  TempSrc: Temporal  PainSc: 0-No pain         Complications: No notable events documented.

## 2021-05-13 ENCOUNTER — Encounter: Payer: Self-pay | Admitting: Gastroenterology

## 2021-05-13 LAB — SURGICAL PATHOLOGY

## 2021-05-21 ENCOUNTER — Telehealth: Payer: Self-pay

## 2021-05-21 NOTE — Telephone Encounter (Signed)
Put in recall for egd and follow up 3 months

## 2021-05-29 ENCOUNTER — Other Ambulatory Visit: Payer: Self-pay

## 2021-05-29 ENCOUNTER — Encounter
Admission: RE | Admit: 2021-05-29 | Discharge: 2021-05-29 | Disposition: A | Discharge status: Home / Self Care | Payer: BC Managed Care – PPO | Source: Ambulatory Visit | Attending: Surgery | Admitting: Surgery

## 2021-05-29 HISTORY — DX: Personal history of other diseases of the digestive system: Z87.19

## 2021-05-29 HISTORY — DX: Dizziness and giddiness: R42

## 2021-05-29 HISTORY — DX: Other specified postprocedural states: Z98.890

## 2021-05-29 HISTORY — DX: Other specified postprocedural states: R11.2

## 2021-05-29 HISTORY — DX: Gastro-esophageal reflux disease without esophagitis: K21.9

## 2021-05-29 NOTE — Patient Instructions (Signed)
Your procedure is scheduled on: 06/02/21 Report to [place]. To find out your arrival time please call [phone] between 1PM - 3PM on 05/29/21.  Remember: Instructions that are not followed completely may result in serious medical risk, up to and including death, or upon the discretion of your surgeon and anesthesiologist your surgery may need to be rescheduled.     _X__ 1. Do not eat food after midnight the night before your procedure.                 No gum chewing or hard candies. You may drink clear liquids up to 2 hours                 before you are scheduled to arrive for your surgery- DO not drink clear                 liquids within 2 hours of the start of your surgery.                 Clear Liquids include:  water, apple juice without pulp, clear carbohydrate                 drink such as Clearfast or Gatorade, Black Coffee or Tea (Do not add                 anything to coffee or tea). Diabetics water only  __X__2.  On the morning of surgery brush your teeth with toothpaste and water, you                 may rinse your mouth with mouthwash if you wish.  Do not swallow any              toothpaste of mouthwash.     _X__ 3.  No Alcohol for 24 hours before or after surgery.   _X__ 4.  Do Not Smoke or use e-cigarettes For 24 Hours Prior to Your Surgery.                 Do not use any chewable tobacco products for at least 6 hours prior to                 surgery.  ____  5.  Bring all medications with you on the day of surgery if instructed.   __X__  6.  Notify your doctor if there is any change in your medical condition      (cold, fever, infections).     Do not wear jewelry, make-up, hairpins, clips or nail polish. Do not wear lotions, powders, or perfumes.  Do not shave body hair 48 hours prior to surgery. Men may shave face and neck. Do not bring valuables to the hospital.    [facility] is not responsible for any  belongings or valuables.  Contacts, dentures/partials or body piercings may not be worn into surgery. Bring a case for your contacts, glasses or hearing aids, a denture cup will be supplied. Leave your suitcase in the car. After surgery it may be brought to your room. For patients admitted to the hospital, discharge time is determined by your treatment team.   Patients discharged the day of surgery will not be allowed to drive home.   Please read over the following fact sheets that you were given:     __X__ Take these medicines the morning of surgery with A SIP OF WATER:  1. omeprazole (PRILOSEC) 40 MG capsule  2.   3.   4.  5.  6.  ____ Fleet Enema (as directed)   __X__ Use CHG Soap/SAGE wipes as directed  May use Dial "Gold" bar soap or come by office for CHG  ____ Use inhalers on the day of surgery  ____ Stop metformin/Janumet/Farxiga 2 days prior to surgery    ____ Take 1/2 of usual insulin dose the night before surgery. No insulin the morning          of surgery.   ____ Stop Blood Thinners Coumadin/Plavix/Xarelto/Pleta/Pradaxa/Eliquis/Effient/Aspirin  on   Or contact your Surgeon, Cardiologist or Medical Doctor regarding  ability to stop your blood thinners  __X__ Stop Anti-inflammatories 7 days before surgery such as Advil, Ibuprofen, Motrin,  BC or Goodies Powder, Naprosyn, Naproxen, Aleve, Aspirin   May take Tylenol  __X__ Stop all herbals or supplements, fish oil or vitamins  until after surgery.    ____ Bring C-Pap to the hospital.

## 2021-06-01 MED ORDER — INDOCYANINE GREEN 25 MG IV SOLR
2.5000 mg | INTRAVENOUS | Status: AC
  Administered 2021-06-02: 2.5 mg via INTRAVENOUS
  Filled 2021-06-01: qty 1

## 2021-06-02 ENCOUNTER — Encounter
Admission: RE | Disposition: A | Discharge status: Home / Self Care | Payer: Self-pay | Source: Ambulatory Visit | Attending: Surgery

## 2021-06-02 ENCOUNTER — Other Ambulatory Visit: Payer: Self-pay

## 2021-06-02 ENCOUNTER — Ambulatory Visit: Payer: BC Managed Care – PPO | Admitting: Certified Registered Nurse Anesthetist

## 2021-06-02 ENCOUNTER — Ambulatory Visit
Admission: RE | Admit: 2021-06-02 | Discharge: 2021-06-02 | Disposition: A | Discharge status: Home / Self Care | Payer: BC Managed Care – PPO | Source: Ambulatory Visit | Attending: Surgery | Admitting: Surgery

## 2021-06-02 ENCOUNTER — Encounter: Payer: Self-pay | Admitting: Surgery

## 2021-06-02 DIAGNOSIS — Z6835 Body mass index (BMI) 35.0-35.9, adult: Secondary | ICD-10-CM | POA: Diagnosis not present

## 2021-06-02 DIAGNOSIS — R109 Unspecified abdominal pain: Secondary | ICD-10-CM | POA: Diagnosis not present

## 2021-06-02 DIAGNOSIS — K8020 Calculus of gallbladder without cholecystitis without obstruction: Secondary | ICD-10-CM | POA: Diagnosis not present

## 2021-06-02 DIAGNOSIS — K8010 Calculus of gallbladder with chronic cholecystitis without obstruction: Secondary | ICD-10-CM | POA: Diagnosis not present

## 2021-06-02 DIAGNOSIS — K219 Gastro-esophageal reflux disease without esophagitis: Secondary | ICD-10-CM | POA: Diagnosis not present

## 2021-06-02 DIAGNOSIS — Z8711 Personal history of peptic ulcer disease: Secondary | ICD-10-CM | POA: Insufficient documentation

## 2021-06-02 SURGERY — CHOLECYSTECTOMY, ROBOT-ASSISTED, LAPAROSCOPIC
Anesthesia: General | Site: Abdomen

## 2021-06-02 MED ORDER — 0.9 % SODIUM CHLORIDE (POUR BTL) OPTIME
TOPICAL | Status: DC | PRN
  Administered 2021-06-02: 500 mL

## 2021-06-02 MED ORDER — ROCURONIUM BROMIDE 100 MG/10ML IV SOLN
INTRAVENOUS | Status: DC | PRN
  Administered 2021-06-02: 50 mg via INTRAVENOUS
  Administered 2021-06-02: 20 mg via INTRAVENOUS

## 2021-06-02 MED ORDER — OXYCODONE HCL 5 MG PO TABS: 5.0000 mg | ORAL_TABLET | ORAL | 0 refills | Status: DC | PRN

## 2021-06-02 MED ORDER — LORAZEPAM 2 MG/ML IJ SOLN: 1.0000 mg | Freq: Once | INTRAMUSCULAR | Status: DC | PRN

## 2021-06-02 MED ORDER — PROPOFOL 10 MG/ML IV BOLUS
INTRAVENOUS | Status: DC | PRN
  Administered 2021-06-02: 150 mg via INTRAVENOUS

## 2021-06-02 MED ORDER — ONDANSETRON HCL 4 MG/2ML IJ SOLN
INTRAMUSCULAR | Status: DC | PRN
  Administered 2021-06-02: 4 mg via INTRAVENOUS

## 2021-06-02 MED ORDER — ONDANSETRON HCL 4 MG/2ML IJ SOLN
INTRAMUSCULAR | Status: AC
  Filled 2021-06-02: qty 2

## 2021-06-02 MED ORDER — APREPITANT 40 MG PO CAPS
ORAL_CAPSULE | ORAL | Status: AC
  Administered 2021-06-02: 40 mg via ORAL
  Filled 2021-06-02: qty 1

## 2021-06-02 MED ORDER — DEXAMETHASONE SODIUM PHOSPHATE 10 MG/ML IJ SOLN
INTRAMUSCULAR | Status: DC | PRN
  Administered 2021-06-02: 10 mg via INTRAVENOUS

## 2021-06-02 MED ORDER — CELECOXIB 200 MG PO CAPS
ORAL_CAPSULE | ORAL | Status: AC
  Administered 2021-06-02: 200 mg via ORAL
  Filled 2021-06-02: qty 1

## 2021-06-02 MED ORDER — CHLORHEXIDINE GLUCONATE 0.12 % MT SOLN: 15.0000 mL | Freq: Once | OROMUCOSAL | Status: AC

## 2021-06-02 MED ORDER — BUPIVACAINE-EPINEPHRINE (PF) 0.25% -1:200000 IJ SOLN
INTRAMUSCULAR | Status: AC
  Filled 2021-06-02: qty 30

## 2021-06-02 MED ORDER — ORAL CARE MOUTH RINSE: 15.0000 mL | Freq: Once | OROMUCOSAL | Status: AC

## 2021-06-02 MED ORDER — FENTANYL CITRATE (PF) 100 MCG/2ML IJ SOLN
INTRAMUSCULAR | Status: DC | PRN
  Administered 2021-06-02 (×2): 50 ug via INTRAVENOUS

## 2021-06-02 MED ORDER — LACTATED RINGERS IV SOLN: INTRAVENOUS | Status: DC

## 2021-06-02 MED ORDER — CELECOXIB 200 MG PO CAPS: 200.0000 mg | ORAL_CAPSULE | Freq: Once | ORAL | Status: AC

## 2021-06-02 MED ORDER — ACETAMINOPHEN 500 MG PO TABS: 1000.0000 mg | ORAL_TABLET | Freq: Four times a day (QID) | ORAL | Status: AC | PRN

## 2021-06-02 MED ORDER — IBUPROFEN 600 MG PO TABS: 600.0000 mg | ORAL_TABLET | Freq: Three times a day (TID) | ORAL | 1 refills | Status: DC | PRN

## 2021-06-02 MED ORDER — ACETAMINOPHEN 500 MG PO TABS
ORAL_TABLET | ORAL | Status: AC
  Administered 2021-06-02: 1000 mg via ORAL
  Filled 2021-06-02: qty 2

## 2021-06-02 MED ORDER — CEFAZOLIN SODIUM-DEXTROSE 2-4 GM/100ML-% IV SOLN
INTRAVENOUS | Status: AC
  Filled 2021-06-02: qty 100

## 2021-06-02 MED ORDER — CHLORHEXIDINE GLUCONATE 0.12 % MT SOLN
OROMUCOSAL | Status: AC
  Administered 2021-06-02: 15 mL via OROMUCOSAL
  Filled 2021-06-02: qty 15

## 2021-06-02 MED ORDER — BUPIVACAINE-EPINEPHRINE (PF) 0.25% -1:200000 IJ SOLN
INTRAMUSCULAR | Status: DC | PRN
  Administered 2021-06-02: 30 mL

## 2021-06-02 MED ORDER — FENTANYL CITRATE (PF) 100 MCG/2ML IJ SOLN
INTRAMUSCULAR | Status: AC
  Filled 2021-06-02: qty 2

## 2021-06-02 MED ORDER — DEXMEDETOMIDINE (PRECEDEX) IN NS 20 MCG/5ML (4 MCG/ML) IV SYRINGE
PREFILLED_SYRINGE | INTRAVENOUS | Status: DC | PRN
  Administered 2021-06-02: 4 ug via INTRAVENOUS

## 2021-06-02 MED ORDER — MIDAZOLAM HCL 2 MG/2ML IJ SOLN
INTRAMUSCULAR | Status: DC | PRN
  Administered 2021-06-02: 2 mg via INTRAVENOUS

## 2021-06-02 MED ORDER — GABAPENTIN 300 MG PO CAPS: 300.0000 mg | ORAL_CAPSULE | ORAL | Status: AC

## 2021-06-02 MED ORDER — HYDROMORPHONE HCL 1 MG/ML IJ SOLN
0.2500 mg | INTRAMUSCULAR | Status: DC | PRN
  Administered 2021-06-02: 0.5 mg via INTRAVENOUS

## 2021-06-02 MED ORDER — PROPOFOL 10 MG/ML IV BOLUS
INTRAVENOUS | Status: AC
  Filled 2021-06-02: qty 20

## 2021-06-02 MED ORDER — CHLORHEXIDINE GLUCONATE CLOTH 2 % EX PADS: 6.0000 | MEDICATED_PAD | Freq: Once | CUTANEOUS | Status: DC

## 2021-06-02 MED ORDER — HYDROMORPHONE HCL 1 MG/ML IJ SOLN
INTRAMUSCULAR | Status: AC
  Administered 2021-06-02: 0.5 mg via INTRAVENOUS
  Filled 2021-06-02: qty 1

## 2021-06-02 MED ORDER — MEPERIDINE HCL 25 MG/ML IJ SOLN: 6.2500 mg | INTRAMUSCULAR | Status: DC | PRN

## 2021-06-02 MED ORDER — MIDAZOLAM HCL 2 MG/2ML IJ SOLN
INTRAMUSCULAR | Status: AC
  Filled 2021-06-02: qty 2

## 2021-06-02 MED ORDER — ACETAMINOPHEN 500 MG PO TABS: 1000.0000 mg | ORAL_TABLET | ORAL | Status: AC

## 2021-06-02 MED ORDER — EPHEDRINE SULFATE 50 MG/ML IJ SOLN
INTRAMUSCULAR | Status: DC | PRN
  Administered 2021-06-02: 10 mg via INTRAVENOUS
  Administered 2021-06-02 (×2): 5 mg via INTRAVENOUS

## 2021-06-02 MED ORDER — ONDANSETRON HCL 4 MG/2ML IJ SOLN: 4.0000 mg | Freq: Once | INTRAMUSCULAR | Status: DC | PRN

## 2021-06-02 MED ORDER — APREPITANT 40 MG PO CAPS: 40.0000 mg | ORAL_CAPSULE | Freq: Once | ORAL | Status: AC

## 2021-06-02 MED ORDER — CEFAZOLIN SODIUM-DEXTROSE 2-4 GM/100ML-% IV SOLN
2.0000 g | INTRAVENOUS | Status: AC
  Administered 2021-06-02: 2 g via INTRAVENOUS

## 2021-06-02 MED ORDER — GABAPENTIN 300 MG PO CAPS
ORAL_CAPSULE | ORAL | Status: AC
  Administered 2021-06-02: 300 mg via ORAL
  Filled 2021-06-02: qty 1

## 2021-06-02 MED ORDER — ROCURONIUM BROMIDE 10 MG/ML (PF) SYRINGE
PREFILLED_SYRINGE | INTRAVENOUS | Status: AC
  Filled 2021-06-02: qty 10

## 2021-06-02 MED ORDER — DEXAMETHASONE SODIUM PHOSPHATE 10 MG/ML IJ SOLN
INTRAMUSCULAR | Status: AC
  Filled 2021-06-02: qty 1

## 2021-06-02 MED ORDER — SUGAMMADEX SODIUM 200 MG/2ML IV SOLN
INTRAVENOUS | Status: DC | PRN
  Administered 2021-06-02: 150 mg via INTRAVENOUS

## 2021-06-02 SURGICAL SUPPLY — 56 items
ADH SKN CLS APL DERMABOND .7 (GAUZE/BANDAGES/DRESSINGS) ×2
BAG INFUSER PRESSURE 100CC (MISCELLANEOUS) IMPLANT
BAG SPEC RTRVL LRG 6X4 10 (ENDOMECHANICALS) ×2
CANNULA REDUC XI 12-8 STAPL (CANNULA) ×3
CANNULA REDUCER 12-8 DVNC XI (CANNULA) ×2 IMPLANT
CLIP LIGATING HEMO O LOK GREEN (MISCELLANEOUS) ×3 IMPLANT
CUP MEDICINE 2OZ PLAST GRAD ST (MISCELLANEOUS) ×3 IMPLANT
DECANTER SPIKE VIAL GLASS SM (MISCELLANEOUS) ×3 IMPLANT
DERMABOND ADVANCED (GAUZE/BANDAGES/DRESSINGS) ×1
DERMABOND ADVANCED .7 DNX12 (GAUZE/BANDAGES/DRESSINGS) ×2 IMPLANT
DRAPE ARM DVNC X/XI (DISPOSABLE) ×8 IMPLANT
DRAPE COLUMN DVNC XI (DISPOSABLE) ×2 IMPLANT
DRAPE DA VINCI XI ARM (DISPOSABLE) ×12
DRAPE DA VINCI XI COLUMN (DISPOSABLE) ×3
ELECT CAUTERY BLADE TIP 2.5 (TIP) ×3
ELECT REM PT RETURN 9FT ADLT (ELECTROSURGICAL) ×3
ELECTRODE CAUTERY BLDE TIP 2.5 (TIP) ×2 IMPLANT
ELECTRODE REM PT RTRN 9FT ADLT (ELECTROSURGICAL) ×2 IMPLANT
GAUZE 4X4 16PLY ~~LOC~~+RFID DBL (SPONGE) ×3 IMPLANT
GLOVE SURG SYN 7.0 (GLOVE) ×6 IMPLANT
GLOVE SURG SYN 7.0 PF PI (GLOVE) ×4 IMPLANT
GLOVE SURG SYN 7.5  E (GLOVE) ×6
GLOVE SURG SYN 7.5 E (GLOVE) ×4 IMPLANT
GLOVE SURG SYN 7.5 PF PI (GLOVE) ×4 IMPLANT
GOWN STRL REUS W/ TWL LRG LVL3 (GOWN DISPOSABLE) ×8 IMPLANT
GOWN STRL REUS W/TWL LRG LVL3 (GOWN DISPOSABLE) ×12
IRRIGATOR SUCT 8 DISP DVNC XI (IRRIGATION / IRRIGATOR) IMPLANT
IRRIGATOR SUCTION 8MM XI DISP (IRRIGATION / IRRIGATOR)
IV NS 1000ML (IV SOLUTION)
IV NS 1000ML BAXH (IV SOLUTION) IMPLANT
KIT PINK PAD W/HEAD ARE REST (MISCELLANEOUS) ×3
KIT PINK PAD W/HEAD ARM REST (MISCELLANEOUS) ×2 IMPLANT
LABEL OR SOLS (LABEL) ×3 IMPLANT
MANIFOLD NEPTUNE II (INSTRUMENTS) ×3 IMPLANT
NEEDLE HYPO 22GX1.5 SAFETY (NEEDLE) ×3 IMPLANT
NS IRRIG 500ML POUR BTL (IV SOLUTION) ×3 IMPLANT
OBTURATOR OPTICAL STANDARD 8MM (TROCAR) ×3
OBTURATOR OPTICAL STND 8 DVNC (TROCAR) ×2
OBTURATOR OPTICALSTD 8 DVNC (TROCAR) ×2 IMPLANT
PACK LAP CHOLECYSTECTOMY (MISCELLANEOUS) ×3 IMPLANT
PENCIL ELECTRO HAND CTR (MISCELLANEOUS) ×3 IMPLANT
POUCH SPECIMEN RETRIEVAL 10MM (ENDOMECHANICALS) ×3 IMPLANT
SEAL CANN UNIV 5-8 DVNC XI (MISCELLANEOUS) ×6 IMPLANT
SEAL XI 5MM-8MM UNIVERSAL (MISCELLANEOUS) ×9
SET TUBE SMOKE EVAC HIGH FLOW (TUBING) ×3 IMPLANT
SOLUTION ELECTROLUBE (MISCELLANEOUS) ×3 IMPLANT
SPONGE T-LAP 18X18 ~~LOC~~+RFID (SPONGE) IMPLANT
STAPLER CANNULA SEAL DVNC XI (STAPLE) ×2 IMPLANT
STAPLER CANNULA SEAL XI (STAPLE) ×3
SUT MNCRL AB 4-0 PS2 18 (SUTURE) ×3 IMPLANT
SUT VIC AB 3-0 SH 27 (SUTURE)
SUT VIC AB 3-0 SH 27X BRD (SUTURE) IMPLANT
SUT VICRYL 0 AB UR-6 (SUTURE) ×6 IMPLANT
TAPE TRANSPORE STRL 2 31045 (GAUZE/BANDAGES/DRESSINGS) ×3 IMPLANT
TROCAR BALLN GELPORT 12X130M (ENDOMECHANICALS) ×3 IMPLANT
WATER STERILE IRR 500ML POUR (IV SOLUTION) ×3 IMPLANT

## 2021-06-02 NOTE — Discharge Instructions (Signed)

## 2021-06-02 NOTE — Op Note (Signed)
°  Procedure Date:  06/02/2021  Pre-operative Diagnosis:  Symptomatic cholelithiasis  Post-operative Diagnosis: Symptomatic cholelithiasis  Procedure:  Robotic assisted cholecystectomy with ICG FireFly cholangiogram  Surgeon:  Melvyn Neth, MD  Assistant:  Amado Coe, PA-S  Anesthesia:  General endotracheal  Estimated Blood Loss:  10 ml  Specimens:  gallbladder  Complications:  None  Indications for Procedure:  This is a 51 y.o. female who presents with abdominal pain and workup revealing symptomatic cholelithiasis.  The benefits, complications, treatment options, and expected outcomes were discussed with the patient. The risks of bleeding, infection, recurrence of symptoms, failure to resolve symptoms, bile duct damage, bile duct leak, retained common bile duct stone, bowel injury, and need for further procedures were all discussed with the patient and she was willing to proceed.  Description of Procedure: The patient was correctly identified in the preoperative area and brought into the operating room.  The patient was placed supine with VTE prophylaxis in place.  Appropriate time-outs were performed.  Anesthesia was induced and the patient was intubated.  Appropriate antibiotics were infused.  The abdomen was prepped and draped in a sterile fashion. An infraumbilical incision was made. A cutdown technique was used to enter the abdominal cavity without injury, and a 12 mm robotic port was inserted.  Pneumoperitoneum was obtained with appropriate opening pressures.  Three 8-mm ports were placed in the mid abdomen at the level of the umbilicus under direct visualization.  The DaVinci platform was docked, camera targeted, and instruments were placed under direct visualization.  The gallbladder was identified.  The fundus was grasped and retracted cephalad.  Adhesions were lysed bluntly and with electrocautery. The infundibulum was grasped and retracted laterally, exposing the  peritoneum overlying the gallbladder.  This was incised with electrocautery and extended on either side of the gallbladder.  FireFly cholangiogram was then obtained, and we were able to clearly identify the cystic duct and common bile duct.  The cystic duct and cystic artery were carefully dissected with combination of cautery and blunt dissection.  Both were clipped twice proximally and once distally, cutting in between.  The gallbladder was taken from the gallbladder fossa in a retrograde fashion with electrocautery. The gallbladder was placed in an Endocatch bag. The liver bed was inspected and any bleeding was controlled with electrocautery. The right upper quadrant was then inspected again revealing intact clips, no bleeding, and no ductal injury.  The 8 mm ports were removed under direct visualization and the 12 mm port was removed.  The Endocatch bag was brought out via the umbilical incision. The fascial opening was closed using 0 vicryl suture.  Local anesthetic was infused in all incisions and the incisions were closed with 4-0 Monocryl.  The wounds were cleaned and sealed with DermaBond.  The patient was emerged from anesthesia and extubated and brought to the recovery room for further management.  The patient tolerated the procedure well and all counts were correct at the end of the case.   Melvyn Neth, MD

## 2021-06-02 NOTE — Anesthesia Preprocedure Evaluation (Addendum)
Anesthesia Evaluation  Patient identified by MRN, date of birth, ID band Patient awake    History of Anesthesia Complications (+) PONV  Airway Mallampati: II  TM Distance: >3 FB Neck ROM: Full    Dental no notable dental hx.    Pulmonary    Pulmonary exam normal breath sounds clear to auscultation       Cardiovascular Normal cardiovascular exam Rhythm:Regular Rate:Normal  EKG  SR  RAD   Neuro/Psych    GI/Hepatic hiatal hernia, PUD, GERD  ,CHOLELITHIASIS   Endo/Other  Morbid obesity  Renal/GU      Musculoskeletal   Abdominal   Peds  Hematology   Anesthesia Other Findings   Reproductive/Obstetrics                            Anesthesia Physical Anesthesia Plan  ASA: 2  Anesthesia Plan: General ETT   Post-op Pain Management:    Induction: Intravenous  PONV Risk Score and Plan: 3  Airway Management Planned: Oral ETT  Additional Equipment:   Intra-op Plan:   Post-operative Plan: Extubation in OR  Informed Consent: I have reviewed the patients History and Physical, chart, labs and discussed the procedure including the risks, benefits and alternatives for the proposed anesthesia with the patient or authorized representative who has indicated his/her understanding and acceptance.     Dental Advisory Given  Plan Discussed with: Anesthesiologist, CRNA and Surgeon  Anesthesia Plan Comments: (Patient consented for risks of anesthesia including but not limited to:  - adverse reactions to medications - damage to eyes, teeth, lips or other oral mucosa - nerve damage due to positioning  - sore throat or hoarseness - Damage to heart, brain, nerves, lungs, other parts of body or loss of life  Patient voiced understanding.)        Anesthesia Quick Evaluation

## 2021-06-02 NOTE — Progress Notes (Signed)
Patient slow to awaken from anesthesia/procedure,  states this has happened to her in the past.  Received 1mg  dilaudid IV as ordered for abd pain 7/10. Placed on O2 @ 2L [place]. IS every 51mins. Placed in [place] chair. Will continue to monitor. Vague c/o's nausea: no emesis: medicated with zofran IV as ordered.

## 2021-06-02 NOTE — Transfer of Care (Signed)
Immediate Anesthesia Transfer of Care Note  Patient: Janet Lynn  Procedure(s) Performed: XI ROBOTIC ASSISTED LAPAROSCOPIC CHOLECYSTECTOMY (Abdomen) INDOCYANINE GREEN FLUORESCENCE IMAGING (ICG)  Patient Location: PACU  Anesthesia Type:General  Level of Consciousness: awake and drowsy  Airway & Oxygen Therapy: Patient Spontanous Breathing and Patient connected to face mask oxygen  Post-op Assessment: Report given to RN and Post -op Vital signs reviewed and stable  Post vital signs: Reviewed and stable  Last Vitals:  Vitals Value Taken Time  BP 128/73 06/02/21 1303  Temp    Pulse 86 06/02/21 1307  Resp 15 06/02/21 1307  SpO2 100 % 06/02/21 1307  Vitals shown include unvalidated device data.  Last Pain:  Vitals:   06/02/21 0935  TempSrc: Temporal  PainSc: 0-No pain         Complications: No notable events documented.

## 2021-06-02 NOTE — Anesthesia Procedure Notes (Signed)
Procedure Name: Intubation Date/Time: 06/02/2021 11:19 AM Performed by: Gentry Fitz, CRNA Pre-anesthesia Checklist: Patient identified, Emergency Drugs available, Suction available and Patient being monitored Patient Re-evaluated:Patient Re-evaluated prior to induction Oxygen Delivery Method: Circle system utilized Preoxygenation: Pre-oxygenation with 100% oxygen Induction Type: IV induction Ventilation: Mask ventilation without difficulty Laryngoscope Size: McGraph and 4 Grade View: Grade II Tube type: Oral Tube size: 7.0 mm Number of attempts: 1 Airway Equipment and Method: Stylet and Oral airway Placement Confirmation: ETT inserted through vocal cords under direct vision, positive ETCO2 and breath sounds checked- equal and bilateral Tube secured with: Tape Dental Injury: Teeth and Oropharynx as per pre-operative assessment

## 2021-06-02 NOTE — Anesthesia Postprocedure Evaluation (Signed)
Anesthesia Post Note  Patient: [employer]  Procedure(s) Performed: XI ROBOTIC ASSISTED LAPAROSCOPIC CHOLECYSTECTOMY (Abdomen) INDOCYANINE GREEN FLUORESCENCE IMAGING (ICG)  Anesthesia Type: General Anesthetic complications: no   There were no known notable events for this encounter.   Last Vitals:  Vitals:   06/02/21 0935 06/02/21 1305  BP: (!) 157/97 128/73  Pulse: 85 83  Resp: 16 18  Temp: 37.1 C 36.7 C  SpO2: 100% 100%    Last Pain:  Vitals:   06/02/21 1305  TempSrc:   PainSc: 0-No pain                 [place] [facility]

## 2021-06-02 NOTE — Progress Notes (Signed)
Anesthesia made aware of slow waking up, continue with plan of care. Husband updated.

## 2021-06-02 NOTE — H&P (Signed)
Date of Admission:  06/02/2021  Reason for Admission:  Symptomatic cholelithiasis  History of Present Illness: Janet Lynn is a 51 y.o. female presenting for surgery today for symptomatic cholelithiasis.  Have planned for robotic assisted cholecystectomy with ICG cholangiogram.  Patient denies any new or worsening symptoms.  She had an EGD and colonoscopy with Dr. Marius Ditch.  Past Medical History: Past Medical History:  Diagnosis Date   Acute sinusitis 07/23/2010   Allergy    GERD (gastroesophageal reflux disease)    History of hiatal hernia    Migraine with aura    PONV (postoperative nausea and vomiting)    Vertigo      Past Surgical History: Past Surgical History:  Procedure Laterality Date   APPENDECTOMY  1987   BREAST REDUCTION SURGERY     COLONOSCOPY WITH PROPOFOL N/A 05/12/2021   Procedure: COLONOSCOPY WITH PROPOFOL;  Surgeon: Lin Landsman, MD;  Location: [facility] ENDOSCOPY;  Service: Gastroenterology;  Laterality: N/A;   ESOPHAGOGASTRODUODENOSCOPY (EGD) WITH PROPOFOL N/A 05/12/2021   Procedure: ESOPHAGOGASTRODUODENOSCOPY (EGD) WITH PROPOFOL;  Surgeon: Lin Landsman, MD;  Location: [facility] ENDOSCOPY;  Service: Gastroenterology;  Laterality: N/A;   THYROID SURGERY  1989   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 Authorizing Provider  omeprazole (PRILOSEC) 40 MG capsule Take 1 capsule (40 mg total) by mouth 2 (two) times daily before a meal. 05/12/21 06/11/21 Yes Vanga, Tally Due, MD    Allergies: Allergies  Allergen Reactions   Erythromycin Rash    Social History:  reports that she has never smoked. She has never used smokeless tobacco. She reports that she does not drink alcohol and does not use drugs.   Family History: Family History  Problem Relation Age of Onset   Hypertension Mother    Diabetes Mother     Colon cancer Neg Hx    Breast cancer Neg Hx     Review of Systems: Review of Systems  Constitutional:  Negative for chills and fever.  Respiratory:  Negative for shortness of breath.   Cardiovascular:  Negative for chest pain.  Gastrointestinal:  Negative for abdominal pain, nausea and vomiting.   Physical Exam BP (!) 157/97    Pulse 85    Temp 98.8 F (37.1 C) (Temporal)    Resp 16    Ht 5\' 1"  (1.549 m)    Wt 86.2 kg    SpO2 100%    BMI 35.90 kg/m  CONSTITUTIONAL: No acute distress HEENT:  Normocephalic, atraumatic, extraocular motion intact. NECK: Trachea is midline, and there is no jugular venous distension.  RESPIRATORY:  Lungs are clear, and breath sounds are equal bilaterally. Normal respiratory effort without pathologic use of accessory muscles. CARDIOVASCULAR: Heart is regular without murmurs, gallops, or rubs. GI: The abdomen is soft, non-distended, non-tender.. There were no palpable masses. There was no hepatosplenomegaly. MUSCULOSKELETAL:  Normal muscle strength and tone in all four extremities.  No peripheral edema or cyanosis. SKIN: Skin turgor is normal. There are no pathologic skin lesions.  NEUROLOGIC:  Motor and sensation is grossly normal.  Cranial nerves are grossly intact. PSYCH:  Alert and oriented to person, place and time. Affect is normal.  Laboratory Analysis: No results found for this or any previous visit (from the past 24 hour(s)).  Imaging: No results found.  Assessment and Plan: This is a  51 y.o. female with symptomatic cholelithiasis  --Discussed with patient the plan again for a robotic assisted cholecystectomy with ICG cholangiogram.  The patient understands the risks of surgery and is willing to proceed.  ICG has been given already.  All questions have been answered.   Melvyn Neth, MD [facility] Pg:  [phone]

## 2021-06-03 LAB — SURGICAL PATHOLOGY

## 2021-06-16 ENCOUNTER — Other Ambulatory Visit: Payer: Self-pay

## 2021-06-16 ENCOUNTER — Ambulatory Visit (INDEPENDENT_AMBULATORY_CARE_PROVIDER_SITE_OTHER): Payer: BC Managed Care – PPO | Admitting: Physician Assistant

## 2021-06-16 ENCOUNTER — Encounter: Payer: Self-pay | Admitting: Physician Assistant

## 2021-06-16 VITALS — BP 143/94 | HR 82 | Temp 97.8°F | Ht 61.0 in | Wt 199.0 lb

## 2021-06-16 DIAGNOSIS — Z09 Encounter for follow-up examination after completed treatment for conditions other than malignant neoplasm: Secondary | ICD-10-CM

## 2021-06-16 DIAGNOSIS — K8020 Calculus of gallbladder without cholecystitis without obstruction: Secondary | ICD-10-CM

## 2021-06-16 NOTE — Progress Notes (Signed)
[facility] POST-OP OFFICE VISIT  06/16/2021  HPI: Janet Lynn is a 51 y.o. female 14 days s/p robotic assisted laparoscopic cholecystectomy for symptomatic cholelithiasis with Dr Hampton Abbot.   She is overall doing well Some bloating but she has been constipated lately. She states she may go a few days before a BM.  Minimal incisional soreness No fever, chills, nausea, emesis   Vital signs: BP (!) 143/94    Pulse 82    Temp 97.8 F (36.6 C)    Ht 5\' 1"  (1.549 m)    Wt 199 lb (90.3 kg)    SpO2 99%    BMI 37.60 kg/m    Physical Exam: Constitutional: Well appearing female, NAD Abdomen: Soft, non-tender, non-distended, no rebound/guarding Skin: Laparoscopic incisions are healing well, no erythema or drainage   Assessment/Plan: This is a 51 y.o. female 14 days s/p robotic assisted laparoscopic cholecystectomy for symptomatic cholelithiasis    - Pain control prn  - Miralax encouraged to help manage constipation; I believe this may be the source of her bloating  - Reviewed wound care recommendation  - Reviewed lifting restrictions; 4 weeks total  - Reviewed surgical pathology; [place]  - She can follow up on as needed basis; She understands to call with questions/concerns  -- Edison Simon, PA-C  [facility] 06/16/2021, 1:55 PM [phone] M-F: 7am - 4pm

## 2021-06-16 NOTE — Patient Instructions (Signed)

## 2022-01-08 ENCOUNTER — Ambulatory Visit: Payer: BC Managed Care – PPO | Admitting: Family

## 2022-01-08 ENCOUNTER — Encounter: Payer: Self-pay | Admitting: Family

## 2022-01-08 VITALS — BP 128/78 | HR 87 | Temp 98.3°F | Resp 16 | Ht 61.0 in | Wt 205.4 lb

## 2022-01-08 DIAGNOSIS — J0100 Acute maxillary sinusitis, unspecified: Secondary | ICD-10-CM | POA: Insufficient documentation

## 2022-01-08 DIAGNOSIS — J3089 Other allergic rhinitis: Secondary | ICD-10-CM | POA: Diagnosis not present

## 2022-01-08 DIAGNOSIS — J309 Allergic rhinitis, unspecified: Secondary | ICD-10-CM | POA: Insufficient documentation

## 2022-01-08 DIAGNOSIS — Z780 Asymptomatic menopausal state: Secondary | ICD-10-CM | POA: Diagnosis not present

## 2022-01-08 DIAGNOSIS — H8111 Benign paroxysmal vertigo, right ear: Secondary | ICD-10-CM | POA: Diagnosis not present

## 2022-01-08 MED ORDER — FLUTICASONE PROPIONATE 50 MCG/ACT NA SUSP: 2.0000 | Freq: Every day | NASAL | 6 refills | Status: DC

## 2022-01-08 MED ORDER — AMOXICILLIN-POT CLAVULANATE 875-125 MG PO TABS: 1.0000 | ORAL_TABLET | Freq: Two times a day (BID) | ORAL | 0 refills | Status: DC

## 2022-01-08 MED ORDER — METHYLPREDNISOLONE 4 MG PO TBPK: ORAL_TABLET | ORAL | 0 refills | Status: DC

## 2022-01-08 MED ORDER — MECLIZINE HCL 25 MG PO TABS: ORAL_TABLET | ORAL | 0 refills | Status: AC

## 2022-01-08 NOTE — Assessment & Plan Note (Signed)
Rise slowly upon standing or sitting up  rx meclizine 25 mg, take 1/2 to 1 tablet qhs prn vertigo If no improvement x one week consider referral to ent for epley maneuver Handout given on vertigo to pt and sent via mychart

## 2022-01-08 NOTE — Patient Instructions (Signed)
Start prescription of augmentin 875/125 mg and take as prescribed.  Steroid for sinus pressure if needed Increase oral fluids. Ok to continue with humidifers and hot steamy showers as discussed during visit.  Meclizine as needed for vertigo symptoms.  Rise slowly upon standing to avoid falling.   Start daily flonase nose spray and nightly zyrtec. Continue with this.   It was a pleasure speaking with you today, I hope you start feeling better soon.  Regards,   Eugenia Pancoast

## 2022-01-08 NOTE — Assessment & Plan Note (Signed)
Start daily flonase Nightly zyrtec 10 mg

## 2022-01-08 NOTE — Progress Notes (Signed)
Established Patient Office Visit  Subjective:  Patient ID: Janet Lynn, female    DOB: 1971/03/01  Age: 51 y.o. MRN: [id number]  CC:  Chief Complaint  Patient presents with   Headache    X 1 week   Epistaxis    Saturday and [place]   Dizziness    Have not had much since Saturday. It was more when she laid down    HPI [employer] is here today with concerns.   Last Friday had a migraine.  The next am woke up and the migraine had resolved however stlil with headache and then felt dizzy. She also had nosebleeds x two days which have since resolved. However headache still dull and achy, constant, behind eyes and frontal.   Tylenol which has only helped slightly.   With some nasal congestion. With some maxillary sinus pressure.  No ear pain, with some bil fullness.  No sore throat.  No cough. No chest congestion. No fever or chills.   Dizziness starting to resolve since, however still slightly there. If she turns slightly or to fast will feel this as well. Slight pain back of head.   Over the last six months has had migraines at least four of them which is increasing in frequency.   Past Medical History:  Diagnosis Date   Acute sinusitis 07/23/2010   Allergy    GERD (gastroesophageal reflux disease)    History of hiatal hernia    Migraine with aura    PONV (postoperative nausea and vomiting)    Vertigo     Past Surgical History:  Procedure Laterality Date   APPENDECTOMY  1987   BREAST REDUCTION SURGERY     COLONOSCOPY WITH PROPOFOL N/A 05/12/2021   Procedure: COLONOSCOPY WITH PROPOFOL;  Surgeon: Lin Landsman, MD;  Location: [facility] ENDOSCOPY;  Service: Gastroenterology;  Laterality: N/A;   ESOPHAGOGASTRODUODENOSCOPY (EGD) WITH PROPOFOL N/A 05/12/2021   Procedure: ESOPHAGOGASTRODUODENOSCOPY (EGD) WITH PROPOFOL;  Surgeon: Lin Landsman, MD;  [facility] END[facility]COPY;  Service: Gastroenterology;  Laterality: N/A;   THYROID SURGERY  1989   Benign  tumor removed from thyroid   TONSILLECTOMY     TUBAL LIGATION     bilateral    Family History  Problem Relation Age of Onset   Hypertension Mother    Diabetes Mother    Colon cancer Neg Hx    Breast cancer Neg Hx     Social History   Socioeconomic History   Marital status: Married    Spouse name: Not on file   Number of children: 2   Years of education: Not on file   Highest education level: Not on file  Occupational History    Employer: [employer]  Tobacco Use   Smoking status: Never   Smokeless tobacco: Never  Vaping Use   Vaping Use: Never used  Substance and Sexual Activity   Alcohol use: No   Drug use: No   Sexual activity: Not on file  Other Topics Concern   Not on file  Social History Narrative   Married 1995 and lives with husband   2 boys   Works at [employer] Strain: Not on file  Food Insecurity: Not on file  Transportation Needs: Not on file  Physical Activity: Not on file  Stress: Not on file  Social Connections: Not on file  Intimate Partner Violence: Not on file    Outpatient Medications Prior to  Visit  Medication Sig Dispense Refill   acetaminophen (TYLENOL) 500 MG tablet Take 2 tablets (1,000 mg total) by mouth every 6 (six) hours as needed for mild pain.     ibuprofen (ADVIL) 600 MG tablet Take 1 tablet (600 mg total) by mouth every 8 (eight) hours as needed for moderate pain. 60 tablet 1   omeprazole (PRILOSEC) 40 MG capsule Take 1 capsule (40 mg total) by mouth 2 (two) times daily before a meal. 60 capsule 2   No facility-administered medications prior to visit.    Allergies  Allergen Reactions   Erythromycin Rash        Objective:    Physical Exam Constitutional:      General: She is not in acute distress.    Appearance: Normal appearance. She is well-developed. She is obese. She is not ill-appearing, toxic-appearing or diaphoretic.  HENT:     Head: Normocephalic.      Right Ear: A middle ear effusion (clear) is present.     Left Ear: A middle ear effusion (clear) is present.     Nose: Congestion present. No rhinorrhea.     Right Turbinates: Not enlarged or swollen.     Left Turbinates: Not enlarged or swollen.     Right Sinus: Maxillary sinus tenderness present. No frontal sinus tenderness.     Left Sinus: Maxillary sinus tenderness present. No frontal sinus tenderness.     Mouth/Throat:     Mouth: Mucous membranes are moist.     Pharynx: No pharyngeal swelling, oropharyngeal exudate or posterior oropharyngeal erythema.     Tonsils: No tonsillar exudate.  Eyes:     Extraocular Movements: Extraocular movements intact.     Conjunctiva/sclera: Conjunctivae normal.     Pupils: Pupils are equal, round, and reactive to light.  Neck:     Thyroid: No thyroid mass.  Cardiovascular:     Rate and Rhythm: Normal rate and regular rhythm.  Pulmonary:     Effort: Pulmonary effort is normal.     Breath sounds: Normal breath sounds.  Lymphadenopathy:     Cervical:     Right cervical: No superficial cervical adenopathy.    Left cervical: No superficial cervical adenopathy.  Neurological:     Mental Status: She is alert and oriented to person, place, and time. Mental status is at baseline.     Cranial Nerves: Cranial nerves 2-12 are intact. No cranial nerve deficit or facial asymmetry.     Sensory: Sensation is intact.     Motor: Motor function is intact. No weakness.     Coordination: Romberg sign negative. Coordination normal. Heel to [facility] Test abnormal (slight abnormal sway). Rapid alternating movements normal.     Gait: Gait is intact. Gait normal.     Comments: Dix hallpyke with right sided dizziness no nystagmus     BP 128/78   Pulse 87   Temp 98.3 F (36.8 C)   Resp 16   Ht '5\' 1"'$  (1.549 m)   Wt 205 lb 6 oz (93.2 kg)   SpO2 98%   BMI 38.81 kg/m  Wt Readings from Last 3 Encounters:  01/08/22 205 lb 6 oz (93.2 kg)  06/16/21 199 lb (90.3 kg)   06/02/21 190 lb (86.2 kg)     Health Maintenance Due  Topic Date Due   COVID-19 Vaccine (1) Never done   HIV Screening  Never done   Hepatitis C Screening  Never done   TETANUS/TDAP  07/23/2020   PAP SMEAR-Modifier  07/31/2021  Zoster Vaccines- Shingrix (1 of 2) Never done   MAMMOGRAM  11/26/2021   INFLUENZA VACCINE  12/29/2021    There are no preventive care reminders to display for this patient.  Lab Results  Component Value Date   TSH 2.440 11/27/2019   Lab Results  Component Value Date   WBC 11.9 (H) 02/11/2021   HGB 14.0 02/11/2021   HCT 42.3 02/11/2021   MCV 88.7 02/11/2021   PLT 407 (H) 02/11/2021   Lab Results  Component Value Date   NA 135 02/11/2021   K 3.8 02/11/2021   CO2 28 02/11/2021   GLUCOSE 92 02/11/2021   BUN 20 02/11/2021   CREATININE 1.02 (H) 02/11/2021   BILITOT 0.5 02/11/2021   ALKPHOS 72 02/11/2021   AST 19 02/11/2021   ALT 19 02/11/2021   PROT 7.1 02/11/2021   ALBUMIN 3.9 02/11/2021   CALCIUM 9.1 02/11/2021   ANIONGAP 8 02/11/2021   No results found for: "HGBA1C"    Assessment & Plan:   Problem List Items Addressed This Visit       Respiratory   Allergic rhinitis due to allergen - Primary    Start daily flonase Nightly zyrtec 10 mg      Relevant Medications   amoxicillin-clavulanate (AUGMENTIN) 875-125 MG tablet   methylPREDNISolone (MEDROL DOSEPAK) 4 MG TBPK tablet   fluticasone (FLONASE) 50 MCG/ACT nasal spray   Acute non-recurrent maxillary sinusitis    Prescription given for augmentin 875/125 mg po bid for ten days. Medrol dose pack if needed.  Continue with humidifier prn and steam showers recommended as well. instructed If no symptom improvement in 48 hours please f/u       Relevant Medications   amoxicillin-clavulanate (AUGMENTIN) 875-125 MG tablet   methylPREDNISolone (MEDROL DOSEPAK) 4 MG TBPK tablet   fluticasone (FLONASE) 50 MCG/ACT nasal spray     Nervous and Auditory   Benign paroxysmal positional  vertigo, right    Rise slowly upon standing or sitting up  rx meclizine 25 mg, take 1/2 to 1 tablet qhs prn vertigo If no improvement x one week consider referral to ent for epley maneuver Handout given on vertigo to pt and sent via mychart       Relevant Medications   meclizine (ANTIVERT) 25 MG tablet     Other   Post-menopausal    Meds ordered this encounter  Medications   amoxicillin-clavulanate (AUGMENTIN) 875-125 MG tablet    Sig: Take 1 tablet by mouth 2 (two) times daily.    Dispense:  20 tablet    Refill:  0    Order Specific Question:   Supervising Provider    Answer:   BEDSOLE, AMY E [[id number]]   methylPREDNISolone (MEDROL DOSEPAK) 4 MG TBPK tablet    Sig: Take per package instructions    Dispense:  21 tablet    Refill:  0    Order Specific Question:   Supervising Provider    Answer:   BEDSOLE, AMY E [[id number]]   fluticasone (FLONASE) 50 MCG/ACT nasal spray    Sig: Place 2 sprays into both nostrils daily.    Dispense:  16 g    Refill:  6    Order Specific Question:   Supervising Provider    Answer:   BEDSOLE, AMY E [[id number]]   meclizine (ANTIVERT) 25 MG tablet    Sig: Take 1/2 to one tablet prn vertigo    Dispense:  20 tablet    Refill:  0    Order Specific Question:  Supervising Provider    Answer:   Eliezer Lofts E [[id number]]    Follow-up: Return in about 1 month (around 02/08/2022), or if symptoms worsen or fail to improve with pcp, for for annual visit .    Eugenia Pancoast, FNP

## 2022-01-08 NOTE — Assessment & Plan Note (Signed)
Prescription given for augmentin 875/125 mg po bid for ten days. Medrol dose pack if needed.  Continue with humidifier prn and steam showers recommended as well. instructed If no symptom improvement in 48 hours please f/u

## 2022-01-10 ENCOUNTER — Other Ambulatory Visit: Payer: Self-pay | Admitting: Family Medicine

## 2022-01-10 DIAGNOSIS — K760 Fatty (change of) liver, not elsewhere classified: Secondary | ICD-10-CM

## 2022-01-18 ENCOUNTER — Other Ambulatory Visit (INDEPENDENT_AMBULATORY_CARE_PROVIDER_SITE_OTHER): Payer: BC Managed Care – PPO

## 2022-01-18 DIAGNOSIS — K760 Fatty (change of) liver, not elsewhere classified: Secondary | ICD-10-CM

## 2022-01-18 LAB — COMPREHENSIVE METABOLIC PANEL
ALT: 20 U/L (ref 0–35)
AST: 17 U/L (ref 0–37)
Albumin: 3.9 g/dL (ref 3.5–5.2)
Alkaline Phosphatase: 80 U/L (ref 39–117)
BUN: 13 mg/dL (ref 6–23)
CO2: 28 mEq/L (ref 19–32)
Calcium: 9.4 mg/dL (ref 8.4–10.5)
Chloride: 103 mEq/L (ref 96–112)
Creatinine, Ser: 0.98 mg/dL (ref 0.40–1.20)
GFR: 67.43 mL/min (ref >60.00)
Glucose, Bld: 101 mg/dL — ABNORMAL HIGH (ref 70–99)
Potassium: 4.4 mEq/L (ref 3.5–5.1)
Sodium: 141 mEq/L (ref 135–145)
Total Bilirubin: 0.4 mg/dL (ref 0.2–1.2)
Total Protein: 6.8 g/dL (ref 6.0–8.3)

## 2022-01-18 LAB — CBC WITH DIFFERENTIAL/PLATELET
Basophils Absolute: 0.1 10*3/uL (ref 0.0–0.1)
Basophils Relative: 0.7 % (ref 0.0–3.0)
Eosinophils Absolute: 0.3 10*3/uL (ref 0.0–0.7)
Eosinophils Relative: 3.5 % (ref 0.0–5.0)
HCT: 41.9 % (ref 36.0–46.0)
Hemoglobin: 14 g/dL (ref 12.0–15.0)
Lymphocytes Relative: 29 % (ref 12.0–46.0)
Lymphs Abs: 2.8 10*3/uL (ref 0.7–4.0)
MCHC: 33.3 g/dL (ref 30.0–36.0)
MCV: 89.3 fl (ref 78.0–100.0)
Monocytes Absolute: 0.7 10*3/uL (ref 0.1–1.0)
Monocytes Relative: 7.4 % (ref 3.0–12.0)
Neutro Abs: 5.7 10*3/uL (ref 1.4–7.7)
Neutrophils Relative %: 59.4 % (ref 43.0–77.0)
Platelets: 392 10*3/uL (ref 150.0–400.0)
RBC: 4.7 Mil/uL (ref 3.87–5.11)
RDW: 13.5 % (ref 11.5–15.5)
WBC: 9.6 10*3/uL (ref 4.0–10.5)

## 2022-01-18 LAB — LIPID PANEL
Cholesterol: 218 mg/dL — ABNORMAL HIGH (ref 0–200)
HDL: 41.2 mg/dL (ref >39.00)
LDL Cholesterol: 143 mg/dL — ABNORMAL HIGH (ref 0–99)
NonHDL: 176.6
Total CHOL/HDL Ratio: 5
Triglycerides: 167 mg/dL — ABNORMAL HIGH (ref 0.0–149.0)
VLDL: 33.4 mg/dL (ref 0.0–40.0)

## 2022-01-25 ENCOUNTER — Encounter: Payer: Self-pay | Admitting: Family Medicine

## 2022-01-25 ENCOUNTER — Ambulatory Visit (INDEPENDENT_AMBULATORY_CARE_PROVIDER_SITE_OTHER): Payer: BC Managed Care – PPO | Admitting: Family Medicine

## 2022-01-25 VITALS — BP 122/82 | HR 96 | Temp 97.6°F | Ht 61.0 in | Wt 206.0 lb

## 2022-01-25 DIAGNOSIS — Z23 Encounter for immunization: Secondary | ICD-10-CM

## 2022-01-25 DIAGNOSIS — R1310 Dysphagia, unspecified: Secondary | ICD-10-CM

## 2022-01-25 DIAGNOSIS — R040 Epistaxis: Secondary | ICD-10-CM

## 2022-01-25 DIAGNOSIS — Z7189 Other specified counseling: Secondary | ICD-10-CM

## 2022-01-25 DIAGNOSIS — Z0000 Encounter for general adult medical examination without abnormal findings: Secondary | ICD-10-CM

## 2022-01-25 DIAGNOSIS — G43109 Migraine with aura, not intractable, without status migrainosus: Secondary | ICD-10-CM

## 2022-01-25 MED ORDER — CYCLOBENZAPRINE HCL 5 MG PO TABS: 5.0000 mg | ORAL_TABLET | Freq: Two times a day (BID) | ORAL | 1 refills | Status: AC | PRN

## 2022-01-25 NOTE — Progress Notes (Unsigned)
CPE- See plan.  Routine anticipatory guidance given to patient.  See health maintenance.  The possibility exists that previously documented standard health maintenance information may have been brought forward from a previous encounter into this note.  If needed, that same information has been updated to reflect the current situation based on today's encounter.    Living will d/w pt.  Husband designated if patient were incapacitated.   Tetanus- 2023.   Flu declined.  Covid encouraged.   Shingles- d/w pt.   PNA not due.  Mammogram and pap per gyn.   Colonoscopy 2022.   DXA not due.   Diet and exercise d/w pt.  She is on weight watchers.   HIV and HCV screening prev done ~2018.    D/w pt about GI follow up.  She still has some food sticking in mid esophagus.  See AVS.    H/o nosebleeds.  If recurrent she can update me and we can refer to ENT.    Had seen Dugal 01/08/22.  Tx with augmentin.  Still with episodic HA, better than prior.  H/o migraines.  She has h/o aura.  Migraines since she was a teenager.  Prev saw HA clinic.  Now weekly for the last 3 months.  Photophobia > phonophobia.  Nausea with some of the HA.  Usually takes tylenol if she can catch it early.  Pain is near R orbit, occ occipital.    PMH and SH reviewed  Meds, vitals, and allergies reviewed.   ROS: Per HPI.  Unless specifically indicated otherwise in HPI, the patient denies:  General: fever. Eyes: acute vision changes ENT: sore throat Cardiovascular: chest pain Respiratory: SOB GI: vomiting GU: dysuria Musculoskeletal: acute back pain Derm: acute rash Neuro: acute motor dysfunction Psych: worsening mood Endocrine: polydipsia Heme: bleeding Allergy: hayfever  GEN: nad, alert and oriented HEENT: mucous membranes moist, nasal/OP/TM exam wnl.   NECK: supple w/o LA CV: rrr. PULM: ctab, no inc wob ABD: soft, +bs EXT: no edema SKIN: no acute rash

## 2022-01-25 NOTE — Patient Instructions (Addendum)
Please call about seeing GI.    Cephas Darby, MD [address]  [place]  [place], [place] [address]  Main: [phone]   Try flexeril if needed for migraines.  Let me know if you are at the point of trying a daily preventive medicine.    Check with your insurance to see if they will cover the shingles shot. Take care.  Glad to see you.

## 2022-01-26 ENCOUNTER — Telehealth: Payer: Self-pay

## 2022-01-26 NOTE — Telephone Encounter (Signed)
Called and left a message for call back  

## 2022-01-26 NOTE — Telephone Encounter (Signed)
Patient contacted office to schedule her repeat EGD from December with Dr. Marius Ditch, however I see Dr. Marius Ditch noted that she needed a follow up in 3 months as well. I did not see where she has followed up with that appt. EGD w/Colon performed 05/12/2021.  Please call patient.  Thanks,  [place], [place]

## 2022-01-26 NOTE — Telephone Encounter (Signed)
Let's schedule EGD first DX: Follow-up of erosive esophagitis  RV

## 2022-01-26 NOTE — Telephone Encounter (Signed)
Please advise if patient needs follow up appointment or just EGD scheduled

## 2022-01-27 ENCOUNTER — Encounter: Payer: Self-pay | Admitting: Family Medicine

## 2022-01-27 ENCOUNTER — Other Ambulatory Visit: Payer: Self-pay

## 2022-01-27 DIAGNOSIS — K2210 Ulcer of esophagus without bleeding: Secondary | ICD-10-CM

## 2022-01-27 DIAGNOSIS — R1310 Dysphagia, unspecified: Secondary | ICD-10-CM | POA: Insufficient documentation

## 2022-01-27 DIAGNOSIS — G43109 Migraine with aura, not intractable, without status migrainosus: Secondary | ICD-10-CM | POA: Insufficient documentation

## 2022-01-27 DIAGNOSIS — Z7189 Other specified counseling: Secondary | ICD-10-CM | POA: Insufficient documentation

## 2022-01-27 DIAGNOSIS — R040 Epistaxis: Secondary | ICD-10-CM | POA: Insufficient documentation

## 2022-01-27 NOTE — Assessment & Plan Note (Signed)
Living will d/w pt.  Husband designated if patient were incapacitated.   Tetanus- 2023.   Flu declined.  Covid encouraged.   Shingles- d/w pt.   PNA not due.  Mammogram and pap per gyn.   Colonoscopy 2022.   DXA not due.   Diet and exercise d/w pt.  She is on weight watchers.   HIV and HCV screening prev done ~2018.

## 2022-01-27 NOTE — Assessment & Plan Note (Signed)
Instructions given for patient to follow-up with GI.

## 2022-01-27 NOTE — Assessment & Plan Note (Signed)
Unremarkable exam now.  We can refer to ENT if she has continued symptoms.

## 2022-01-27 NOTE — Assessment & Plan Note (Signed)
Living will d/w pt.  Husband designated if patient were incapacitated.  

## 2022-01-27 NOTE — Assessment & Plan Note (Signed)
Discussed using Flexeril as needed and then she can update me if she needs to start a daily preventative medication.

## 2022-01-27 NOTE — Telephone Encounter (Signed)
Patient first schedule the EGD for 02/24/2022 she then said she had a big meeting and needs to move it to 02/23/2022 in [place]

## 2022-02-15 ENCOUNTER — Encounter: Payer: Self-pay | Admitting: Gastroenterology

## 2022-02-18 NOTE — Anesthesia Preprocedure Evaluation (Addendum)
Anesthesia Evaluation  Patient identified by MRN, date of birth, ID band Patient awake    Reviewed: Allergy & Precautions, H&P , NPO status , Patient's Chart, lab work & pertinent test results  Airway Mallampati: II  TM Distance: >3 FB Neck ROM: Full    Dental no notable dental hx.    Pulmonary neg pulmonary ROS,    Pulmonary exam normal        Cardiovascular negative cardio ROS Normal cardiovascular exam Rhythm:Regular Rate:Normal     Neuro/Psych  Headaches, negative psych ROS   GI/Hepatic Neg liver ROS, hiatal hernia, GERD  ,Upper abdominal pain   Endo/Other  negative endocrine ROS  Renal/GU negative Renal ROS  negative genitourinary   Musculoskeletal negative musculoskeletal ROS (+)   Abdominal (+) + obese,   Peds negative pediatric ROS (+)  Hematology negative hematology ROS (+)   Anesthesia Other Findings   Reproductive/Obstetrics negative OB ROS                            Anesthesia Physical  Anesthesia Plan  ASA: 2  Anesthesia Plan: General   Post-op Pain Management:    Induction: Intravenous  PONV Risk Score and Plan: TIVA  Airway Management Planned: Natural Airway and Simple Face Mask  Additional Equipment:   Intra-op Plan:   Post-operative Plan:   Informed Consent: I have reviewed the patients History and Physical, chart, labs and discussed the procedure including the risks, benefits and alternatives for the proposed anesthesia with the patient or authorized representative who has indicated his/her understanding and acceptance.     Dental advisory given  Plan Discussed with: CRNA and Anesthesiologist  Anesthesia Plan Comments:        Anesthesia Quick Evaluation

## 2022-02-23 ENCOUNTER — Ambulatory Visit
Admission: RE | Admit: 2022-02-23 | Discharge: 2022-02-23 | Disposition: A | Discharge status: Home / Self Care | Payer: BC Managed Care – PPO | Attending: Gastroenterology | Admitting: Gastroenterology

## 2022-02-23 ENCOUNTER — Encounter: Payer: Self-pay | Admitting: Gastroenterology

## 2022-02-23 ENCOUNTER — Ambulatory Visit: Payer: BC Managed Care – PPO | Admitting: Anesthesiology

## 2022-02-23 ENCOUNTER — Other Ambulatory Visit: Payer: Self-pay

## 2022-02-23 ENCOUNTER — Encounter
Admission: RE | Disposition: A | Discharge status: Home / Self Care | Payer: Self-pay | Source: Home / Self Care | Attending: Gastroenterology

## 2022-02-23 DIAGNOSIS — K449 Diaphragmatic hernia without obstruction or gangrene: Secondary | ICD-10-CM | POA: Insufficient documentation

## 2022-02-23 DIAGNOSIS — K2210 Ulcer of esophagus without bleeding: Secondary | ICD-10-CM

## 2022-02-23 DIAGNOSIS — R1314 Dysphagia, pharyngoesophageal phase: Secondary | ICD-10-CM | POA: Insufficient documentation

## 2022-02-23 DIAGNOSIS — K2100 Gastro-esophageal reflux disease with esophagitis, without bleeding: Secondary | ICD-10-CM | POA: Diagnosis not present

## 2022-02-23 DIAGNOSIS — K219 Gastro-esophageal reflux disease without esophagitis: Secondary | ICD-10-CM

## 2022-02-23 DIAGNOSIS — R1010 Upper abdominal pain, unspecified: Secondary | ICD-10-CM | POA: Diagnosis not present

## 2022-02-23 HISTORY — PX: ESOPHAGOGASTRODUODENOSCOPY (EGD) WITH PROPOFOL: SHX5813

## 2022-02-23 SURGERY — ESOPHAGOGASTRODUODENOSCOPY (EGD) WITH PROPOFOL
Anesthesia: General | Site: Mouth

## 2022-02-23 MED ORDER — OMEPRAZOLE 40 MG PO CPDR: 40.0000 mg | DELAYED_RELEASE_CAPSULE | Freq: Two times a day (BID) | ORAL | 2 refills | Status: DC

## 2022-02-23 MED ORDER — PROPOFOL 10 MG/ML IV BOLUS
INTRAVENOUS | Status: DC | PRN
  Administered 2022-02-23 (×5): 50 mg via INTRAVENOUS

## 2022-02-23 MED ORDER — STERILE WATER FOR IRRIGATION IR SOLN
Status: DC | PRN
  Administered 2022-02-23: 1

## 2022-02-23 MED ORDER — GLYCOPYRROLATE 0.2 MG/ML IJ SOLN
INTRAMUSCULAR | Status: DC | PRN
  Administered 2022-02-23: .1 mg via INTRAVENOUS

## 2022-02-23 MED ORDER — LACTATED RINGERS IV SOLN: INTRAVENOUS | Status: DC

## 2022-02-23 MED ORDER — DEXMEDETOMIDINE HCL IN NACL 80 MCG/20ML IV SOLN
INTRAVENOUS | Status: DC | PRN
  Administered 2022-02-23: 10 ug via BUCCAL

## 2022-02-23 MED ORDER — SODIUM CHLORIDE 0.9 % IV SOLN: INTRAVENOUS | Status: DC

## 2022-02-23 MED ORDER — STERILE WATER FOR IRRIGATION IR SOLN: Status: DC | PRN

## 2022-02-23 MED ORDER — LIDOCAINE HCL (CARDIAC) PF 100 MG/5ML IV SOSY
PREFILLED_SYRINGE | INTRAVENOUS | Status: DC | PRN
  Administered 2022-02-23: 100 mg via INTRAVENOUS

## 2022-02-23 SURGICAL SUPPLY — 8 items
BLOCK BITE 60FR ADLT L/F GRN (MISCELLANEOUS) ×1 IMPLANT
FORCEPS BIOP RAD 4 LRG CAP 4 (CUTTING FORCEPS) IMPLANT
GOWN CVR UNV OPN BCK APRN NK (MISCELLANEOUS) ×2 IMPLANT
GOWN ISOL THUMB LOOP REG UNIV (MISCELLANEOUS) ×2
KIT PRC NS LF DISP ENDO (KITS) ×1 IMPLANT
KIT PROCEDURE OLYMPUS (KITS) ×1
MANIFOLD NEPTUNE II (INSTRUMENTS) ×1 IMPLANT
WATER STERILE IRR 250ML POUR (IV SOLUTION) ×1 IMPLANT

## 2022-02-23 NOTE — Transfer of Care (Signed)
Immediate Anesthesia Transfer of Care Note  Patient: Janet Lynn  Procedure(s) Performed: ESOPHAGOGASTRODUODENOSCOPY (EGD) WITH BIOPSIES (Mouth)  Patient Location: PACU  Anesthesia Type: General  Level of Consciousness: awake, alert  and patient cooperative  Airway and Oxygen Therapy: Patient Spontanous Breathing and Patient connected to supplemental oxygen  Post-op Assessment: Post-op Vital signs reviewed, Patient's Cardiovascular Status Stable, Respiratory Function Stable, Patent Airway and No signs of Nausea or vomiting  Post-op Vital Signs: Reviewed and stable  Complications: There were no known notable events for this encounter.

## 2022-02-23 NOTE — H&P (Signed)
Cephas Darby, MD [address]  [place]  [place], [place] [address]  Main: [phone]  Fax: [phone] Pager: [phone]  Primary Care Physician:  Tonia Ghent, MD Primary Gastroenterologist:  Dr. Cephas Darby  Pre-Procedure History & Physical: HPI:  Janet Lynn is a 51 y.o. female is here for an endoscopy.   Past Medical History:  Diagnosis Date   Allergy    GERD (gastroesophageal reflux disease)    History of hiatal hernia    Migraine with aura    PONV (postoperative nausea and vomiting)    Vertigo     Past Surgical History:  Procedure Laterality Date   APPENDECTOMY  05/31/1985   BREAST REDUCTION SURGERY     CHOLECYSTECTOMY     COLONOSCOPY WITH PROPOFOL N/A 05/12/2021   Procedure: COLONOSCOPY WITH PROPOFOL;  Surgeon: Lin Landsman, MD;  Location: [facility] ENDOSCOPY;  Service: Gastroenterology;  Laterality: N/A;   ESOPHAGOGASTRODUODENOSCOPY (EGD) WITH PROPOFOL N/A 05/12/2021   Procedure: ESOPHAGOGASTRODUODENOSCOPY (EGD) WITH PROPOFOL;  Surgeon: Lin Landsman, MD;  Location: [facility] ENDOSCOPY;  Service: Gastroenterology;  Laterality: N/A;   THYROID SURGERY  06/01/1987   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 Authorizing Provider  acetaminophen (TYLENOL) 500 MG tablet Take 2 tablets (1,000 mg total) by mouth every 6 (six) hours as needed for mild pain. 06/02/21  Yes Piscoya, Jacqulyn Bath, MD  fluticasone (FLONASE) 50 MCG/ACT nasal spray Place 2 sprays into both nostrils daily. 01/08/22  Yes Dugal, Lawerance Bach, FNP  meclizine (ANTIVERT) 25 MG tablet Take 1/2 to one tablet prn vertigo 01/08/22  Yes Dugal, Tabitha, FNP  cyclobenzaprine (FLEXERIL) 5 MG tablet Take 1 tablet (5 mg total) by mouth 2 (two) times daily as needed (for migraines.  sedation caution.). 01/25/22   Tonia Ghent, MD    Allergies as of 01/27/2022 - Review  Complete 01/25/2022  Allergen Reaction Noted   Erythromycin Rash 11/07/2007    Family History  Problem Relation Age of Onset   Hypertension Mother    Diabetes Mother    Colon cancer Neg Hx    Breast cancer Neg Hx     Social History   Socioeconomic History   Marital status: Married    Spouse name: Not on file   Number of children: 2   Years of education: Not on file   Highest education level: Not on file  Occupational History    Employer: [employer]  Tobacco Use   Smoking status: Never   Smokeless tobacco: Never  Vaping Use   Vaping Use: Never used  Substance and Sexual Activity   Alcohol use: No   Drug use: No   Sexual activity: Not on file  Other Topics Concern   Not on file  Social History Narrative   Married 1995 and lives with husband   2 boys, both adult (1 son is working as a Dealer for the [employer])   Works at [employer] Strain: Not on [employer] Insecurity: Not on file  Transportation Needs: Not on file  Physical Activity: Not on file  Stress: Not on file  Social Connections: Not on file  Intimate Partner Violence: Not on file    Review of Systems: See HPI, otherwise negative ROS  Physical Exam: BP Marland Kitchen)  138/98   Pulse 83   Temp 97.8 F (36.6 C) (Temporal)   Resp 18   Ht '5\' 1"'$  (1.549 m)   Wt 93 kg   LMP 10/29/2020   SpO2 99%   BMI 38.73 kg/m  General:   Alert,  pleasant and cooperative in NAD Head:  Normocephalic and atraumatic. Neck:  Supple; no masses or thyromegaly. Lungs:  Clear throughout to auscultation.    Heart:  Regular rate and rhythm. Abdomen:  Soft, nontender and nondistended. Normal bowel sounds, without guarding, and without rebound.   Neurologic:  Alert and  oriented x4;  grossly normal neurologically.  Impression/Plan: Janet Lynn is here for an endoscopy to be performed for follow up of erosive esophagitis  Risks, benefits, limitations, and  alternatives regarding  endoscopy have been reviewed with the patient.  Questions have been answered.  All parties agreeable.   Sherri Sear, MD  02/23/2022, 7:30 AM

## 2022-02-23 NOTE — Anesthesia Postprocedure Evaluation (Signed)
Anesthesia Post Note  Patient: Merchant navy officer  Procedure(s) Performed: ESOPHAGOGASTRODUODENOSCOPY (EGD) WITH BIOPSIES (Mouth)     Patient location during evaluation: PACU Anesthesia Type: General Level of consciousness: awake and alert Pain management: pain level controlled Vital Signs Assessment: post-procedure vital signs reviewed and stable Respiratory status: spontaneous breathing, nonlabored ventilation and respiratory function stable Cardiovascular status: blood pressure returned to baseline and stable Postop Assessment: no apparent nausea or vomiting Anesthetic complications: no   There were no known notable events for this encounter.  [place] Ouch

## 2022-02-23 NOTE — Op Note (Signed)
[facility] Gastroenterology Patient Name: Janet Lynn Procedure Date: 02/23/2022 8:31 AM MRN: [id number] Account #: [id number] Date of Birth: [birth year] Admit Type: Outpatient Age: 51 Room: [facility] OR ROOM 01 Gender: Female Note Status: Finalized Instrument Name: [id number] Procedure:             Upper GI endoscopy Indications:           Esophageal dysphagia, Follow-up of esophagitis,                         Follow-up of reflux esophagitis Providers:             Lin Landsman MD, MD Referring MD:          Elveria Rising. Damita Dunnings, MD (Referring MD) Medicines:             General Anesthesia Complications:         No immediate complications. Estimated blood loss: None. Procedure:             Pre-Anesthesia Assessment:                        - Prior to the procedure, a History and Physical was                         performed, and patient medications and allergies were                         reviewed. The patient is competent. The risks and                         benefits of the procedure and the sedation options and                         risks were discussed with the patient. All questions                         were answered and informed consent was obtained.                         Patient identification and proposed procedure were                         verified by the physician, the nurse, the                         anesthesiologist, the anesthetist and the technician                         in the pre-procedure area in the procedure room in the                         endoscopy suite. Mental Status Examination: alert and                         oriented. Airway Examination: normal oropharyngeal                         airway and neck mobility. Respiratory Examination:  clear to auscultation. CV Examination: normal.                         Prophylactic Antibiotics: The patient does not require                         prophylactic  antibiotics. Prior Anticoagulants: The                         patient has taken no previous anticoagulant or                         antiplatelet agents. ASA Grade Assessment: II - A                         patient with mild systemic disease. After reviewing                         the risks and benefits, the patient was deemed in                         satisfactory condition to undergo the procedure. The                         anesthesia plan was to use general anesthesia.                         Immediately prior to administration of medications,                         the patient was re-assessed for adequacy to receive                         sedatives. The heart rate, respiratory rate, oxygen                         saturations, blood pressure, adequacy of pulmonary                         ventilation, and response to care were monitored                         throughout the procedure. The physical status of the                         patient was re-assessed after the procedure.                        After obtaining informed consent, the endoscope was                         passed under direct vision. Throughout the procedure,                         the patient's blood pressure, pulse, and oxygen                         saturations were monitored continuously. The Endoscope  was introduced through the mouth, and advanced to the                         second part of duodenum. The upper GI endoscopy was                         accomplished without difficulty. The patient tolerated                         the procedure well. Findings:      The duodenal bulb and second portion of the duodenum were normal.      The entire examined stomach was normal. Biopsies were taken with a cold       forceps for histology.      The cardia and gastric fundus were normal on retroflexion.      A 2 cm hiatal hernia was present.      One superficial esophageal ulcer with no  bleeding and no stigmata of       recent bleeding was found at the gastroesophageal junction. The lesion       was 10 mm in largest dimension.      LA Grade D (one or more mucosal breaks involving at least 75% of       esophageal circumference) esophagitis with no bleeding was found in the       lower third of the esophagus.      The upper third of the esophagus and middle third of the esophagus were       normal. Biopsies were taken with a cold forceps for histology. Impression:            - Normal duodenal bulb and second portion of the                         duodenum.                        - Normal stomach. Biopsied.                        - 2 cm hiatal hernia.                        - Esophageal ulcer with no bleeding and no stigmata of                         recent bleeding.                        - LA Grade D reflux esophagitis with no bleeding.                        - Normal upper third of esophagus and middle third of                         esophagus. Biopsied. Recommendation:        - Await pathology results.                        - Discharge patient to home (with escort).                        -  Resume previous diet today.                        - Follow an antireflux regimen indefinitely.                        - Use Prilosec (omeprazole) 40 mg PO BID for 3 months.                        - Repeat upper endoscopy in 3 months to check healing. Procedure Code(s):     --- Professional ---                        [phone], Esophagogastroduodenoscopy, flexible,                         transoral; with biopsy, single or multiple Diagnosis Code(s):     --- Professional ---                        K44.9, Diaphragmatic hernia without obstruction or                         gangrene                        K22.10, Ulcer of esophagus without bleeding                        K21.00, Gastro-esophageal reflux disease with                         esophagitis, without bleeding                         R13.14, Dysphagia, pharyngoesophageal phase CPT copyright 2019 American Medical Association. All rights reserved. The codes documented in this report are preliminary and upon coder review may  be revised to meet current compliance requirements. Dr. Ulyess Mort Lin Landsman MD, MD 02/23/2022 8:52:58 AM This report has been signed electronically. Number of Addenda: 0 Note Initiated On: 02/23/2022 8:31 AM Total Procedure Duration: 0 hours 7 minutes 0 seconds  Estimated Blood Loss:  Estimated blood loss: none.      [facility]

## 2022-02-24 ENCOUNTER — Encounter: Payer: Self-pay | Admitting: Gastroenterology

## 2022-02-25 ENCOUNTER — Encounter: Payer: Self-pay | Admitting: Gastroenterology

## 2022-02-25 LAB — SURGICAL PATHOLOGY

## 2022-04-02 ENCOUNTER — Other Ambulatory Visit: Payer: Self-pay | Admitting: *Deleted

## 2022-04-02 ENCOUNTER — Telehealth: Payer: BC Managed Care – PPO | Admitting: *Deleted

## 2022-04-02 DIAGNOSIS — K2210 Ulcer of esophagus without bleeding: Secondary | ICD-10-CM

## 2022-04-02 NOTE — Telephone Encounter (Signed)
Patient called office to schedule her repeat EGD that is due in 04/2022.  We have patient scheduled on 05/28/2022. Instructions have sent.  Patient verbalized understanding.

## 2022-05-27 ENCOUNTER — Encounter: Payer: Self-pay | Admitting: Gastroenterology

## 2022-05-28 ENCOUNTER — Ambulatory Visit: Payer: BC Managed Care – PPO | Admitting: Anesthesiology

## 2022-05-28 ENCOUNTER — Encounter
Admission: RE | Disposition: A | Discharge status: Home / Self Care | Payer: Self-pay | Source: Ambulatory Visit | Attending: Gastroenterology

## 2022-05-28 ENCOUNTER — Ambulatory Visit
Admission: RE | Admit: 2022-05-28 | Discharge: 2022-05-28 | Disposition: A | Discharge status: Home / Self Care | Payer: BC Managed Care – PPO | Source: Ambulatory Visit | Attending: Gastroenterology | Admitting: Gastroenterology

## 2022-05-28 DIAGNOSIS — R1310 Dysphagia, unspecified: Secondary | ICD-10-CM | POA: Diagnosis not present

## 2022-05-28 DIAGNOSIS — K222 Esophageal obstruction: Secondary | ICD-10-CM | POA: Diagnosis not present

## 2022-05-28 DIAGNOSIS — K449 Diaphragmatic hernia without obstruction or gangrene: Secondary | ICD-10-CM | POA: Diagnosis not present

## 2022-05-28 DIAGNOSIS — K219 Gastro-esophageal reflux disease without esophagitis: Secondary | ICD-10-CM | POA: Insufficient documentation

## 2022-05-28 DIAGNOSIS — E669 Obesity, unspecified: Secondary | ICD-10-CM | POA: Diagnosis not present

## 2022-05-28 DIAGNOSIS — Z6838 Body mass index (BMI) 38.0-38.9, adult: Secondary | ICD-10-CM | POA: Diagnosis not present

## 2022-05-28 DIAGNOSIS — K2210 Ulcer of esophagus without bleeding: Secondary | ICD-10-CM | POA: Diagnosis not present

## 2022-05-28 DIAGNOSIS — K208 Other esophagitis without bleeding: Secondary | ICD-10-CM | POA: Diagnosis not present

## 2022-05-28 DIAGNOSIS — R519 Headache, unspecified: Secondary | ICD-10-CM | POA: Diagnosis not present

## 2022-05-28 HISTORY — PX: ESOPHAGOGASTRODUODENOSCOPY (EGD) WITH PROPOFOL: SHX5813

## 2022-05-28 SURGERY — ESOPHAGOGASTRODUODENOSCOPY (EGD) WITH PROPOFOL
Anesthesia: General

## 2022-05-28 MED ORDER — LIDOCAINE HCL (CARDIAC) PF 100 MG/5ML IV SOSY
PREFILLED_SYRINGE | INTRAVENOUS | Status: DC | PRN
  Administered 2022-05-28: 50 mg via INTRAVENOUS

## 2022-05-28 MED ORDER — PROPOFOL 500 MG/50ML IV EMUL
INTRAVENOUS | Status: DC | PRN
  Administered 2022-05-28: 150 ug/kg/min via INTRAVENOUS

## 2022-05-28 MED ORDER — SODIUM CHLORIDE 0.9 % IV SOLN
INTRAVENOUS | Status: DC
  Administered 2022-05-28: 20 mL/h via INTRAVENOUS

## 2022-05-28 MED ORDER — OMEPRAZOLE 40 MG PO CPDR: 40.0000 mg | DELAYED_RELEASE_CAPSULE | Freq: Every day | ORAL | 0 refills | Status: DC

## 2022-05-28 MED ORDER — DEXMEDETOMIDINE HCL IN NACL 80 MCG/20ML IV SOLN
INTRAVENOUS | Status: DC | PRN
  Administered 2022-05-28: 8 ug via BUCCAL

## 2022-05-28 MED ORDER — PROPOFOL 10 MG/ML IV BOLUS
INTRAVENOUS | Status: DC | PRN
  Administered 2022-05-28: 80 mg via INTRAVENOUS

## 2022-05-28 NOTE — Transfer of Care (Addendum)
Immediate Anesthesia Transfer of Care Note  Patient: Janet Lynn  Procedure(s) Performed: ESOPHAGOGASTRODUODENOSCOPY (EGD) WITH PROPOFOL  Patient Location: Endoscopy Unit  Anesthesia Type:General  Level of Consciousness: awake, alert , and drowsy  Airway & Oxygen Therapy: Patient Spontanous Breathing  Post-op Assessment: Report given to RN and Post -op Vital signs reviewed and stable  Post vital signs: Reviewed  Last Vitals:  Vitals Value Taken Time  BP 120/84 05/28/22 0759  Temp    Pulse 79 05/28/22 0759  Resp 16 05/28/22 0759  SpO2 100 % 05/28/22 0759    Last Pain:  Vitals:   05/28/22 0718  TempSrc: Temporal  PainSc: 0-No pain         Complications: No notable events documented.

## 2022-05-28 NOTE — Op Note (Signed)
[facility] Gastroenterology Patient Name: Janet Lynn Procedure Date: 05/28/2022 8:04 AM MRN: [id number] Account #: [id number] Date of Birth: 19-Jun-1970 Admit Type: Outpatient Age: 51 Room: [facility] ENDO ROOM 2 Gender: Female Note Status: Finalized Instrument Name: Upper Endoscope [id number] Procedure:             Upper GI endoscopy Indications:           Follow-up of erosive esophagitis Providers:             Lin Landsman MD, MD Referring MD:          Elveria Rising. Damita Dunnings, MD (Referring MD) Medicines:             General Anesthesia Complications:         No immediate complications. Estimated blood loss: None. Procedure:             Pre-Anesthesia Assessment:                        - Prior to the procedure, a History and Physical was                         performed, and patient medications and allergies were                         reviewed. The patient is competent. The risks and                         benefits of the procedure and the sedation options and                         risks were discussed with the patient. All questions                         were answered and informed consent was obtained.                         Patient identification and proposed procedure were                         verified by the physician, the nurse, the                         anesthesiologist, the anesthetist and the technician                         in the pre-procedure area in the procedure room in the                         endoscopy suite. Mental Status Examination: alert and                         oriented. Airway Examination: normal oropharyngeal                         airway and neck mobility. Respiratory Examination:                         clear to auscultation. CV Examination: normal.  Prophylactic Antibiotics: The patient does not require                         prophylactic antibiotics. Prior Anticoagulants: The                          patient has taken no anticoagulant or antiplatelet                         agents. ASA Grade Assessment: II - A patient with mild                         systemic disease. After reviewing the risks and                         benefits, the patient was deemed in satisfactory                         condition to undergo the procedure. The anesthesia                         plan was to use general anesthesia. Immediately prior                         to administration of medications, the patient was                         re-assessed for adequacy to receive sedatives. The                         heart rate, respiratory rate, oxygen saturations,                         blood pressure, adequacy of pulmonary ventilation, and                         response to care were monitored throughout the                         procedure. The physical status of the patient was                         re-assessed after the procedure.                        After obtaining informed consent, the endoscope was                         passed under direct vision. Throughout the procedure,                         the patient's blood pressure, pulse, and oxygen                         saturations were monitored continuously. The Endoscope                         was introduced through the mouth, and advanced to the  second part of duodenum. The upper GI endoscopy was                         accomplished without difficulty. The patient tolerated                         the procedure well. Findings:      The duodenal bulb and second portion of the duodenum were normal.      A small hiatal hernia was present.      The entire examined stomach was normal.      The cardia and gastric fundus were normal on retroflexion.      The gastroesophageal junction and examined esophagus were normal.       Biopsies were taken with a cold forceps for histology.      A widely patent and non-obstructing  Schatzki ring was found at the       gastroesophageal junction. Impression:            - Normal duodenal bulb and second portion of the                         duodenum.                        - Small hiatal hernia.                        - Normal stomach.                        - Normal gastroesophageal junction and esophagus.                         Biopsied.                        - Widely patent and non-obstructing Schatzki ring. Recommendation:        - Await pathology results.                        - Discharge patient to home (with escort).                        - Resume previous diet today.                        - Continue present medications.                        - Follow an antireflux regimen indefinitely.                        - Use Prilosec (omeprazole) 40 mg PO daily for 3                         months. Procedure Code(s):     --- Professional ---                        [phone], Esophagogastroduodenoscopy, flexible,                         transoral; with  biopsy, single or multiple Diagnosis Code(s):     --- Professional ---                        K44.9, Diaphragmatic hernia without obstruction or                         gangrene                        K22.2, Esophageal obstruction                        K20.90, Esophagitis, unspecified without bleeding CPT copyright 2022 American Medical Association. All rights reserved. The codes documented in this report are preliminary and upon coder review may  be revised to meet current compliance requirements. Dr. Ulyess Mort Lin Landsman MD, MD 05/28/2022 8:18:05 AM This report has been signed electronically. Number of Addenda: 0 Note Initiated On: 05/28/2022 8:04 AM Estimated Blood Loss:  Estimated blood loss: none.      [facility]

## 2022-05-28 NOTE — Anesthesia Preprocedure Evaluation (Signed)
Anesthesia Evaluation  Patient identified by MRN, date of birth, ID band Patient awake    Reviewed: Allergy & Precautions, NPO status , Patient's Chart, lab work & pertinent test results  Airway Mallampati: II  TM Distance: >3 FB Neck ROM: full    Dental  (+) Teeth Intact   Pulmonary neg pulmonary ROS   Pulmonary exam normal breath sounds clear to auscultation       Cardiovascular Exercise Tolerance: Good negative cardio ROS Normal cardiovascular exam Rhythm:Regular Rate:Normal     Neuro/Psych  Headaches negative neurological ROS  negative psych ROS   GI/Hepatic negative GI ROS, Neg liver ROS, hiatal hernia,GERD  Medicated,,  Endo/Other  negative endocrine ROS  Morbid obesity  Renal/GU negative Renal ROS  negative genitourinary   Musculoskeletal   Abdominal  (+) + obese  Peds negative pediatric ROS (+)  Hematology negative hematology ROS (+)   Anesthesia Other Findings Past Medical History: No date: Allergy No date: GERD (gastroesophageal reflux disease) No date: History of hiatal hernia No date: Migraine with aura No date: PONV (postoperative nausea and vomiting) No date: Vertigo  Past Surgical History: 05/31/1985: APPENDECTOMY No date: BREAST REDUCTION SURGERY No date: CHOLECYSTECTOMY 05/12/2021: COLONOSCOPY WITH PROPOFOL; N/A     Comment:  Procedure: COLONOSCOPY WITH PROPOFOL;  Surgeon: Lin Landsman, MD;  Location: [facility] ENDOSCOPY;  Service:               Gastroenterology;  Laterality: N/A; 05/12/2021: ESOPHAGOGASTRODUODENOSCOPY (EGD) WITH PROPOFOL; N/A     Comment:  Procedure: ESOPHAGOGASTRODUODENOSCOPY (EGD) WITH               PROPOFOL;  Surgeon: Lin Landsman, MD;  Location:               [facility] ENDOSCOPY;  Service: Gastroenterology;  Laterality:               N/A; 02/23/2022: ESOPHAGOGASTRODUODENOSCOPY (EGD) WITH PROPOFOL; N/A     Comment:  Procedure:  ESOPHAGOGASTRODUODENOSCOPY (EGD) WITH               BIOPSIES;  Surgeon: Lin Landsman, MD;  Location:               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 No date: TONSILLECTOMY No date: TUBAL LIGATION     Comment:  bilateral  BMI    Body Mass Index: 38.17 kg/m      Reproductive/Obstetrics negative OB ROS                             Anesthesia Physical Anesthesia Plan  ASA: 2  Anesthesia Plan: General   Post-op Pain Management:    Induction: Intravenous  PONV Risk Score and Plan: Propofol infusion and TIVA  Airway Management Planned: Natural Airway  Additional Equipment:   Intra-op Plan:   Post-operative Plan:   Informed Consent: I have reviewed the patients History and Physical, chart, labs and discussed the procedure including the risks, benefits and alternatives for the proposed anesthesia with the patient or authorized representative who has indicated his/her understanding and acceptance.     Dental Advisory Given  Plan Discussed with: CRNA and Surgeon  Anesthesia Plan Comments:  Anesthesia Quick Evaluation  

## 2022-05-28 NOTE — Anesthesia Postprocedure Evaluation (Signed)
Anesthesia Post Note  Patient: [employer]chant [employer] officer  Procedure(s) Performed: ESOPHAGOGASTRODUODENOSCOPY (EGD) WITH PROPOFOL  Patient location during evaluation: PACU Anesthesia Type: General Level of consciousness: awake and awake and alert Pain management: pain level controlled Vital Signs Assessment: post-procedure vital signs reviewed and stable Respiratory status: nonlabored ventilation Cardiovascular status: stable Anesthetic complications: no  No notable events documented.   Last Vitals:  Vitals:   05/28/22 0822 05/28/22 0829  BP:  105/66  Pulse: 78   Resp:    Temp:    SpO2: 93%     Last Pain:  Vitals:   05/28/22 0829  TempSrc:   PainSc: 0-No pain                 VAN STAVEREN,Armend Hochstatter

## 2022-05-28 NOTE — Anesthesia Procedure Notes (Signed)
Date/Time: 05/28/2022 8:06 AM  Performed by: Johnna Acosta, CRNAPre-anesthesia Checklist: Patient identified, Emergency Drugs available, Patient being monitored, Suction available and Timeout performed Patient Re-evaluated:Patient Re-evaluated prior to induction Oxygen Delivery Method: Nasal cannula Preoxygenation: Pre-oxygenation with 100% oxygen Induction Type: IV induction

## 2022-05-28 NOTE — H&P (Signed)
Cephas Darby, MD [address]  [place]  [place], [place] [address]  Main: [phone]  Fax: [phone] Pager: [phone]  Primary Care Physician:  Tonia Ghent, MD Primary Gastroenterologist:  Dr. Cephas Darby  Pre-Procedure History & Physical: HPI:  Janet Lynn is a 51 y.o. female is here for an endoscopy.   Past Medical History:  Diagnosis Date   Allergy    GERD (gastroesophageal reflux disease)    History of hiatal hernia    Migraine with aura    PONV (postoperative nausea and vomiting)    Vertigo     Past Surgical History:  Procedure Laterality Date   APPENDECTOMY  05/31/1985   BREAST REDUCTION SURGERY     CHOLECYSTECTOMY     COLONOSCOPY WITH PROPOFOL N/A 05/12/2021   Procedure: COLONOSCOPY WITH PROPOFOL;  Surgeon: Lin Landsman, MD;  Location: [facility] ENDOSCOPY;  Service: Gastroenterology;  Laterality: N/A;   ESOPHAGOGASTRODUODENOSCOPY (EGD) WITH PROPOFOL N/A 05/12/2021   Procedure: ESOPHAGOGASTRODUODENOSCOPY (EGD) WITH PROPOFOL;  Surgeon: Lin Landsman, MD;  Location: [facility] ENDOSCOPY;  Service: Gastroenterology;  Laterality: N/A;   ESOPHAGOGASTRODUODENOSCOPY (EGD) WITH PROPOFOL N/A 02/23/2022   Procedure: ESOPHAGOGASTRODUODENOSCOPY (EGD) WITH BIOPSIES;  Surgeon: Lin Landsman, MD;  Location: [place];  Service: Endoscopy;  Laterality: N/A;   THYROID SURGERY  06/01/1987   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 Authorizing Provider  acetaminophen (TYLENOL) 500 MG tablet Take 2 tablets (1,000 mg total) by mouth every 6 (six) hours as needed for mild pain. 06/02/21  Yes Piscoya, Jacqulyn Bath, MD  cyclobenzaprine (FLEXERIL) 5 MG tablet Take 1 tablet (5 mg total) by mouth 2 (two) times daily as needed (for migraines.  sedation caution.). 01/25/22  Yes Tonia Ghent, MD  fluticasone [facility]) 50 MCG/ACT  nasal spray Place 2 sprays into both nostrils daily. 01/08/22  Yes Dugal, Lawerance Bach, FNP  meclizine (ANTIVERT) 25 MG tablet Take 1/2 to one tablet prn vertigo 01/08/22  Yes Eugenia Pancoast, FNP  omeprazole (PRILOSEC) 40 MG capsule Take 1 capsule (40 mg total) by mouth 2 (two) times daily before a meal. 02/23/22 05/24/22  Lin Landsman, MD    Allergies as of 04/02/2022 - Review Complete 02/23/2022  Allergen Reaction Noted   Erythromycin Rash 11/07/2007    Family History  Problem Relation Age of Onset   Hypertension Mother    Diabetes Mother    Colon cancer Neg Hx    Breast cancer Neg Hx     Social History   Socioeconomic History   Marital status: Married    Spouse name: Not on file   Number of children: 2   Years of education: Not on file   Highest education level: Not on file  Occupational History    Employer: [employer]  Tobacco Use   Smoking status: Never   Smokeless tobacco: Never  Vaping Use   Vaping Use: Never used  Substance and Sexual Activity   Alcohol use: No   Drug use: No   Sexual activity: Not on file  Other Topics Concern   Not on file  Social History Narrative   Married 1995 and lives with husband   2 boys, both adult (1 son is working as a Dealer for the [employer])   Works at [employer]  Financial Resource Strain: Not on file  Food Insecurity: Not on file  Transportation Needs: Not on file  Physical Activity: Not on file  Stress: Not on file  Social Connections: Not on file  Intimate Partner Violence: Not on file    Review of Systems: See HPI, otherwise negative ROS  Physical Exam: BP (!) 133/107   Pulse 80   Temp (!) 96.7 F (35.9 C) (Temporal)   Resp 20   Ht '5\' 1"'$  (1.549 m)   Wt 91.6 kg   LMP 10/29/2020   BMI 38.17 kg/m  General:   Alert,  pleasant and cooperative in NAD Head:  Normocephalic and atraumatic. Neck:  Supple; no masses or thyromegaly. Lungs:  Clear throughout to  auscultation.    Heart:  Regular rate and rhythm. Abdomen:  Soft, nontender and nondistended. Normal bowel sounds, without guarding, and without rebound.   Neurologic:  Alert and  oriented x4;  grossly normal neurologically.  Impression/Plan: Janet Lynn is here for an endoscopy to be performed for follow up of erosive esophagitis  Risks, benefits, limitations, and alternatives regarding  endoscopy have been reviewed with the patient.  Questions have been answered.  All parties agreeable.   Sherri Sear, MD  05/28/2022, 7:41 AM

## 2022-05-29 ENCOUNTER — Encounter: Payer: Self-pay | Admitting: Gastroenterology

## 2022-06-01 LAB — SURGICAL PATHOLOGY

## 2022-06-04 ENCOUNTER — Encounter: Payer: Self-pay | Admitting: Gastroenterology

## 2022-09-22 ENCOUNTER — Other Ambulatory Visit: Payer: Self-pay | Admitting: Gastroenterology

## 2022-09-23 MED ORDER — OMEPRAZOLE 40 MG PO CPDR: 40.0000 mg | DELAYED_RELEASE_CAPSULE | Freq: Every day | ORAL | 0 refills | Status: DC

## 2022-12-01 IMAGING — US US ABDOMEN LIMITED
1 series · 14 of 25 positions shown · non-contrast
Comparison: None.

CLINICAL DATA: Upper abdominal pain x5 days with nausea and
vomiting.

EXAM:
ULTRASOUND ABDOMEN LIMITED RIGHT UPPER QUADRANT

[Series 1: us abdomen limited ruq (liver/gb) · 14 of 41 slices shown]
[im 1/41]
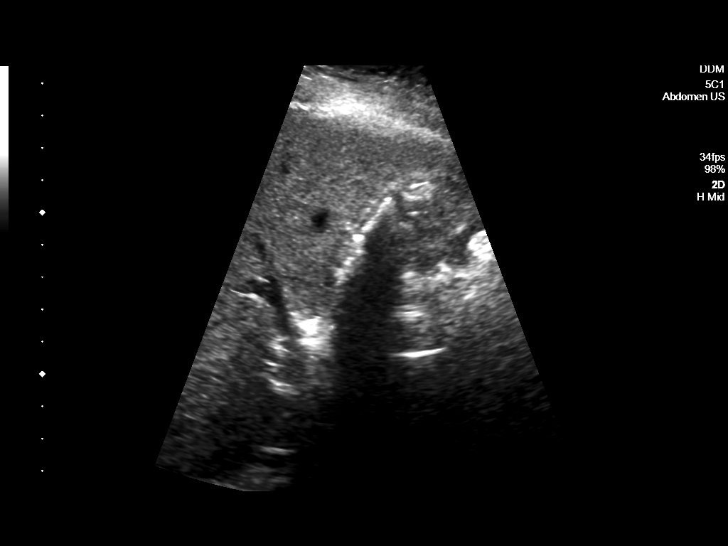
[im 4/41]
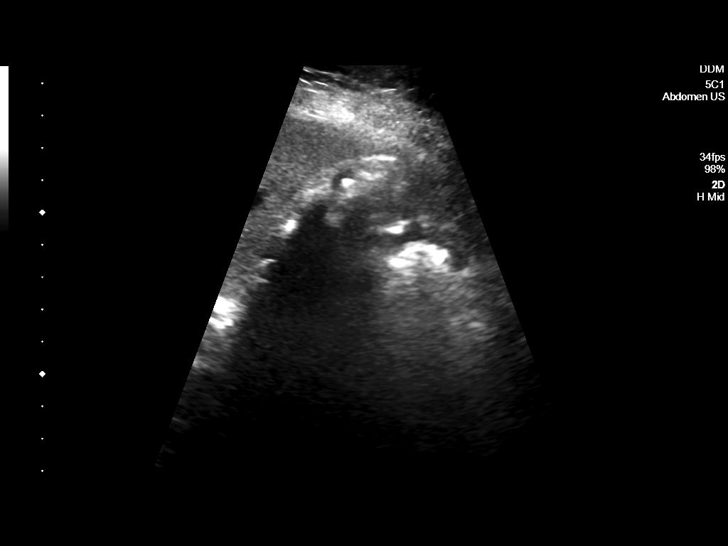
[im 7/41]
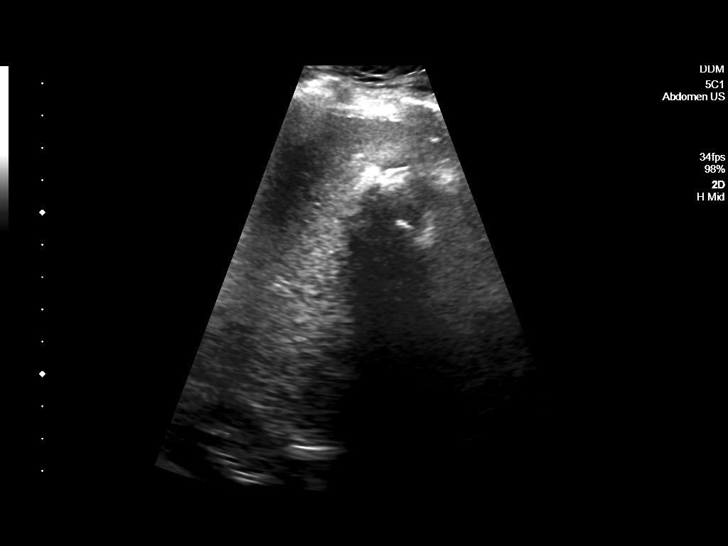
[im 11/41]
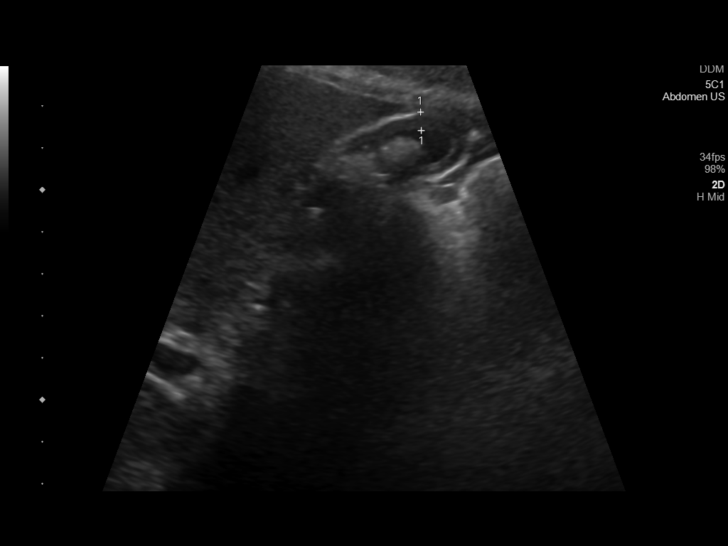
[im 14/41]
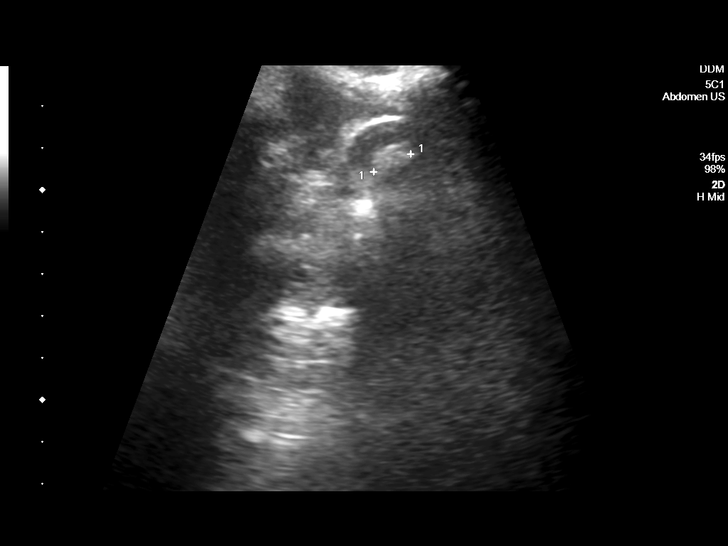
[im 16/41]
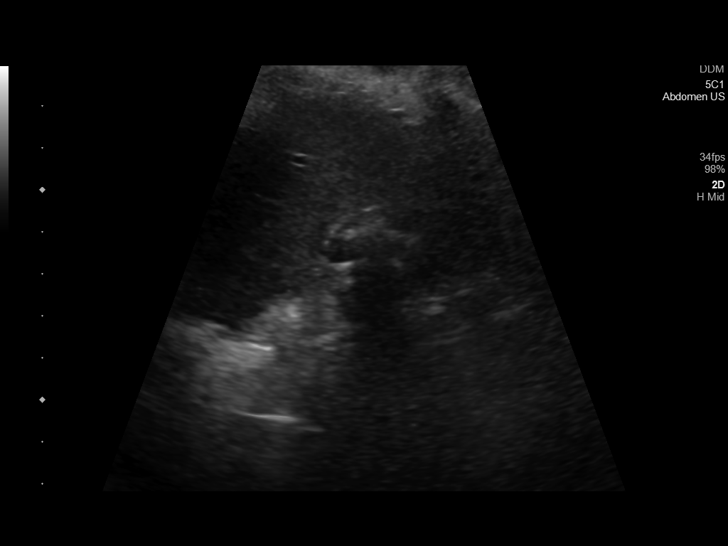
[im 19/41]
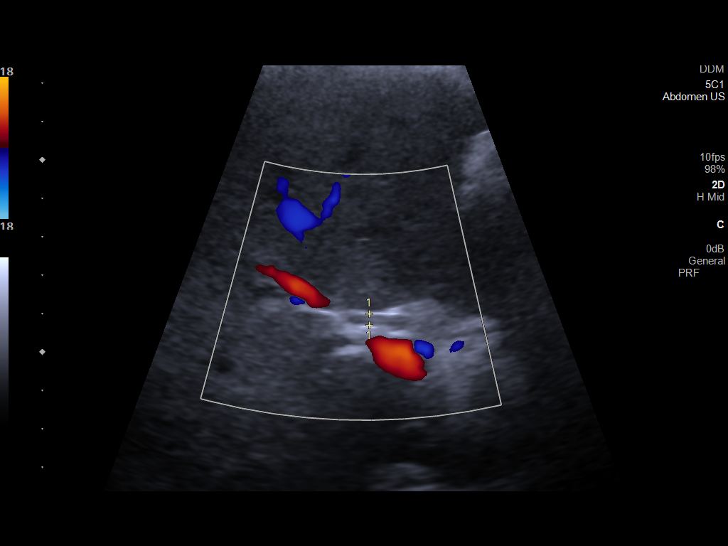
[im 22/41]
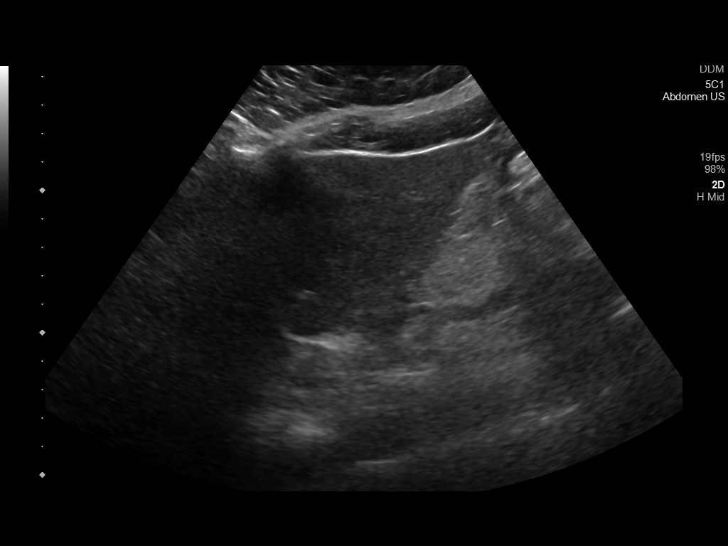
[im 26/41]
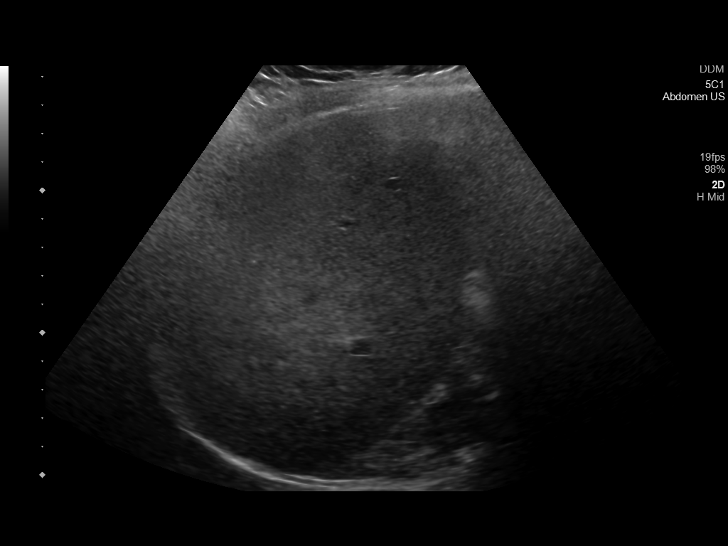
[im 27/41]
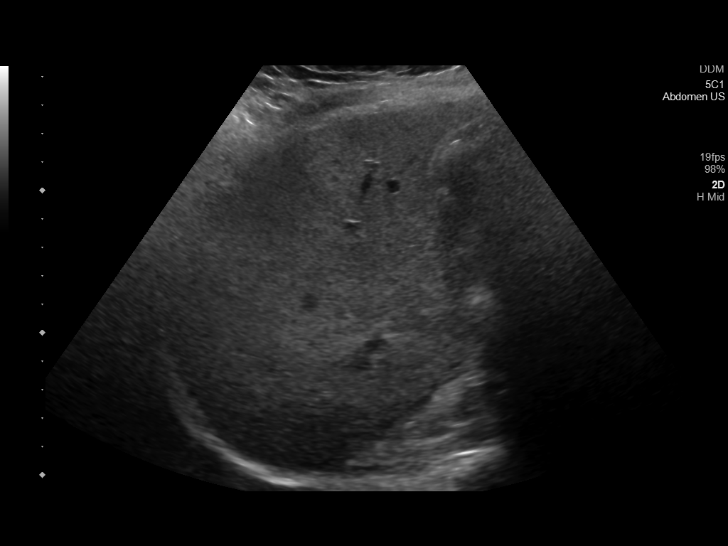
[im 31/41]
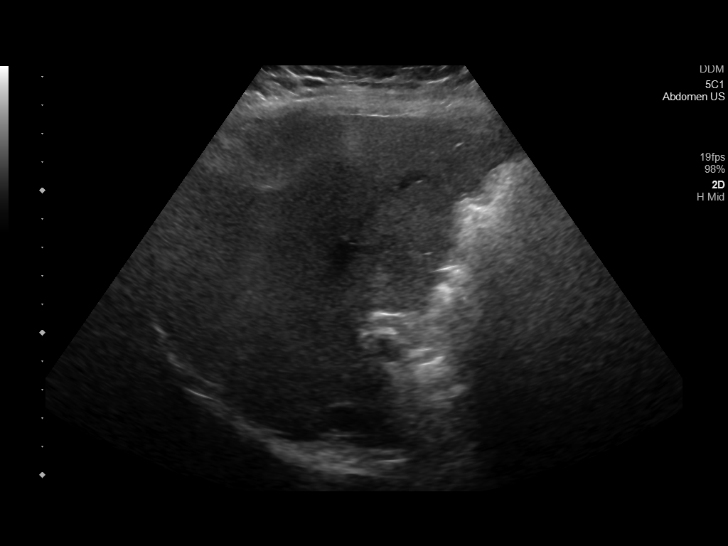
[im 34/41]
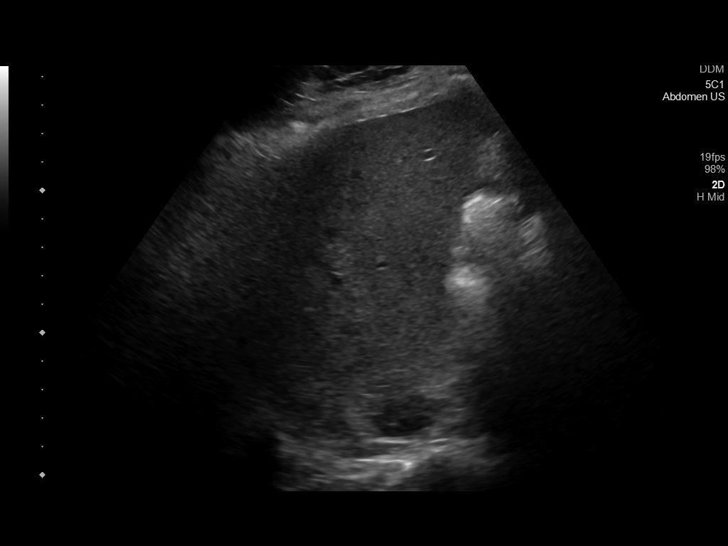
[im 37/41]
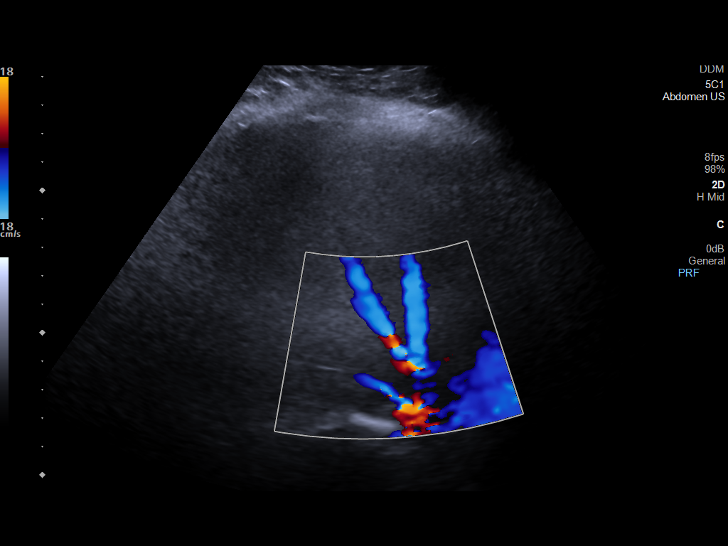
[im 41/41]
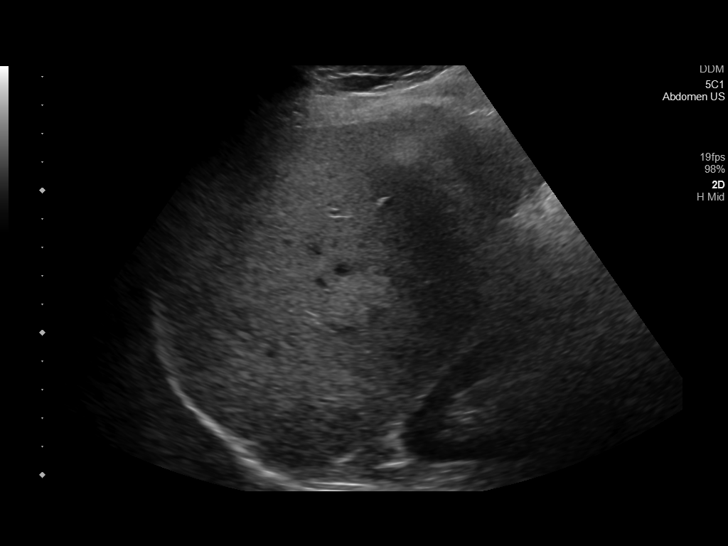

[14 of 25 positions shown; findings below may reference images not displayed]

FINDINGS: Gallbladder:

Multiple shadowing echogenic gallstones are seen within the lumen of
a contracted gallbladder. The largest measures approximately 10 mm.
The gallbladder wall measures 4.4 mm in thickness. No sonographic
Murphy sign noted by sonographer.

Common bile duct:

Diameter: 3.0 mm

Liver:

No focal lesion identified. Mildly increased echogenicity of the
liver parenchyma is seen. Portal vein is patent on color Doppler
imaging with normal direction of blood flow towards the liver.

Other: None.
IMPRESSION: 1. Cholelithiasis without acute cholecystitis.
2. Mild hepatic steatosis.

## 2022-12-19 ENCOUNTER — Other Ambulatory Visit: Payer: Self-pay | Admitting: Family Medicine

## 2022-12-20 NOTE — Telephone Encounter (Signed)
Patient is due for CPE next month; please call patient to schedule appt.

## 2022-12-20 NOTE — Telephone Encounter (Signed)
Patient scheduled.

## 2022-12-20 NOTE — Telephone Encounter (Signed)
LVM for pt to cb and schedule.

## 2023-01-13 ENCOUNTER — Encounter (INDEPENDENT_AMBULATORY_CARE_PROVIDER_SITE_OTHER): Payer: Self-pay

## 2023-01-24 ENCOUNTER — Other Ambulatory Visit: Payer: Self-pay | Admitting: Family Medicine

## 2023-01-24 DIAGNOSIS — E785 Hyperlipidemia, unspecified: Secondary | ICD-10-CM

## 2023-01-25 ENCOUNTER — Other Ambulatory Visit (INDEPENDENT_AMBULATORY_CARE_PROVIDER_SITE_OTHER): Payer: BC Managed Care – PPO

## 2023-01-25 DIAGNOSIS — E785 Hyperlipidemia, unspecified: Secondary | ICD-10-CM | POA: Diagnosis not present

## 2023-01-25 LAB — COMPREHENSIVE METABOLIC PANEL
ALT: 21 U/L (ref 0–35)
AST: 17 U/L (ref 0–37)
Albumin: 3.9 g/dL (ref 3.5–5.2)
Alkaline Phosphatase: 76 U/L (ref 39–117)
BUN: 19 mg/dL (ref 6–23)
CO2: 28 mEq/L (ref 19–32)
Calcium: 9.2 mg/dL (ref 8.4–10.5)
Chloride: 103 mEq/L (ref 96–112)
Creatinine, Ser: 1.05 mg/dL (ref 0.40–1.20)
GFR: 61.63 mL/min (ref >60.00)
Glucose, Bld: 99 mg/dL (ref 70–99)
Potassium: 4.5 mEq/L (ref 3.5–5.1)
Sodium: 137 mEq/L (ref 135–145)
Total Bilirubin: 0.3 mg/dL (ref 0.2–1.2)
Total Protein: 6.8 g/dL (ref 6.0–8.3)

## 2023-01-25 LAB — LIPID PANEL
Cholesterol: 209 mg/dL — ABNORMAL HIGH (ref 0–200)
HDL: 42 mg/dL (ref >39.00)
LDL Cholesterol: 134 mg/dL — ABNORMAL HIGH (ref 0–99)
NonHDL: 167.47
Total CHOL/HDL Ratio: 5
Triglycerides: 165 mg/dL — ABNORMAL HIGH (ref 0.0–149.0)
VLDL: 33 mg/dL (ref 0.0–40.0)

## 2023-01-25 LAB — CBC WITH DIFFERENTIAL/PLATELET
Basophils Absolute: 0.4 10*3/uL — ABNORMAL HIGH (ref 0.0–0.1)
Basophils Relative: 3.2 % — ABNORMAL HIGH (ref 0.0–3.0)
Eosinophils Absolute: 0.4 10*3/uL (ref 0.0–0.7)
Eosinophils Relative: 3.7 % (ref 0.0–5.0)
HCT: 40.6 % (ref 36.0–46.0)
Hemoglobin: 13.2 g/dL (ref 12.0–15.0)
Lymphocytes Relative: 23.5 % (ref 12.0–46.0)
Lymphs Abs: 2.9 10*3/uL (ref 0.7–4.0)
MCHC: 32.5 g/dL (ref 30.0–36.0)
MCV: 89.4 fl (ref 78.0–100.0)
Monocytes Absolute: 1 10*3/uL (ref 0.1–1.0)
Monocytes Relative: 8.1 % (ref 3.0–12.0)
Neutro Abs: 7.5 10*3/uL (ref 1.4–7.7)
Neutrophils Relative %: 61.5 % (ref 43.0–77.0)
Platelets: 429 10*3/uL — ABNORMAL HIGH (ref 150.0–400.0)
RBC: 4.54 Mil/uL (ref 3.87–5.11)
RDW: 13.6 % (ref 11.5–15.5)
WBC: 12.1 10*3/uL — ABNORMAL HIGH (ref 4.0–10.5)

## 2023-02-01 ENCOUNTER — Ambulatory Visit (INDEPENDENT_AMBULATORY_CARE_PROVIDER_SITE_OTHER): Payer: BC Managed Care – PPO | Admitting: Family Medicine

## 2023-02-01 ENCOUNTER — Encounter: Payer: Self-pay | Admitting: Family Medicine

## 2023-02-01 VITALS — BP 112/80 | HR 80 | Temp 98.3°F | Ht 61.0 in | Wt 212.0 lb

## 2023-02-01 DIAGNOSIS — Z7189 Other specified counseling: Secondary | ICD-10-CM

## 2023-02-01 DIAGNOSIS — G43109 Migraine with aura, not intractable, without status migrainosus: Secondary | ICD-10-CM

## 2023-02-01 DIAGNOSIS — R7989 Other specified abnormal findings of blood chemistry: Secondary | ICD-10-CM | POA: Diagnosis not present

## 2023-02-01 DIAGNOSIS — D75839 Thrombocytosis, unspecified: Secondary | ICD-10-CM | POA: Diagnosis not present

## 2023-02-01 DIAGNOSIS — Z0000 Encounter for general adult medical examination without abnormal findings: Secondary | ICD-10-CM

## 2023-02-01 DIAGNOSIS — R1310 Dysphagia, unspecified: Secondary | ICD-10-CM

## 2023-02-01 LAB — CBC WITH DIFFERENTIAL/PLATELET
Absolute Monocytes: 720 {cells}/uL (ref 200–950)
Basophils Absolute: 58 {cells}/uL (ref 0–200)
Basophils Relative: 0.6 %
Eosinophils Absolute: 317 {cells}/uL (ref 15–500)
Eosinophils Relative: 3.3 %
HCT: 43 % (ref 35.0–45.0)
Hemoglobin: 14.2 g/dL (ref 11.7–15.5)
Lymphs Abs: 3178 {cells}/uL (ref 850–3900)
MCH: 29.1 pg (ref 27.0–33.0)
MCHC: 33 g/dL (ref 32.0–36.0)
MCV: 88.1 fL (ref 80.0–100.0)
MPV: 9.6 fL (ref 7.5–12.5)
Monocytes Relative: 7.5 %
Neutro Abs: 5328 {cells}/uL (ref 1500–7800)
Neutrophils Relative %: 55.5 %
Platelets: 434 10*3/uL — ABNORMAL HIGH (ref 140–400)
RBC: 4.88 10*6/uL (ref 3.80–5.10)
RDW: 12.5 % (ref 11.0–15.0)
Total Lymphocyte: 33.1 %
WBC: 9.6 10*3/uL (ref 3.8–10.8)

## 2023-02-01 MED ORDER — OMEPRAZOLE 40 MG PO CPDR: 40.0000 mg | DELAYED_RELEASE_CAPSULE | Freq: Every day | ORAL | 3 refills | Status: DC

## 2023-02-01 NOTE — Patient Instructions (Addendum)
Ask the gyn clinic to please send me a copy of your note.  Take care.  Glad to see you. You could try cutting back by 1 pill of omeprazole per week if doing well.  Go to the lab on the way out.   If you have mychart we'll likely use that to update you.     I would get a flu shot each fall.

## 2023-02-01 NOTE — Progress Notes (Addendum)
CPE- See plan.  Routine anticipatory guidance given to patient.  See health maintenance.  The possibility exists that previously documented standard health maintenance information may have been brought forward from a previous encounter into this note.  If needed, that same information has been updated to reflect the current situation based on today's encounter.    Living will d/w pt.  Husband designated if patient were incapacitated.   Tetanus- 2023.   Flu declined.  Covid prev encouraged.   Shingles- d/w pt.   PNA not due.  Mammogram and pap per gyn.  I'll defer and she agrees.    Colonoscopy 2022.   DXA not due.   Diet and exercise d/w pt.  D/w pt about restarting weight watchers.   HIV and HCV screening prev done ~2018.    Her son got married last week.    Taking flexeril prn for migraines.  It helps, discussed with patient about sedation caution. Overall less often with migraines.   She has seen GI.  Swallowing is normal now.  No vomiting, no blood in stool.  No black stools.  Taking PPI usually daily.  D/w pt about options, ie slow taper.    WBC d/w pt.  PLT d/w pt.  No red flag sx.  D/w pt about checking peripheral smear.    PMH and SH reviewed  Meds, vitals, and allergies reviewed.   ROS: Per HPI.  Unless specifically indicated otherwise in HPI, the patient denies:  General: fever. Eyes: acute vision changes ENT: sore throat Cardiovascular: chest pain Respiratory: SOB GI: vomiting GU: dysuria Musculoskeletal: acute back pain Derm: acute rash Neuro: acute motor dysfunction Psych: worsening mood Endocrine: polydipsia Heme: bleeding Allergy: hayfever  GEN: nad, alert and oriented HEENT: mucous membranes moist NECK: supple w/o LA CV: rrr. PULM: ctab, no inc wob ABD: soft, +bs EXT: no edema SKIN: no acute rash  The 10-year ASCVD risk score (Arnett DK, et al., 2019) is: 1.5%   Values used to calculate the score:     Age: 52 years     Sex: Female     Is  Non-Hispanic African American: No     Diabetic: No     Tobacco smoker: No     Systolic Blood Pressure: 112 mmHg     Is BP treated: No     HDL Cholesterol: 42 mg/dL     Total Cholesterol: 209 mg/dL

## 2023-02-02 DIAGNOSIS — R7989 Other specified abnormal findings of blood chemistry: Secondary | ICD-10-CM | POA: Insufficient documentation

## 2023-02-02 LAB — PATHOLOGIST SMEAR REVIEW

## 2023-02-02 NOTE — Assessment & Plan Note (Signed)
History of.  Resolved in the meantime.  Discussed with patient about attempting slow taper PPI.  Update [place] as needed.

## 2023-02-02 NOTE — Assessment & Plan Note (Signed)
Continue as needed Flexeril 

## 2023-02-02 NOTE — Assessment & Plan Note (Signed)
Living will d/w pt.  Husband designated if patient were incapacitated.  

## 2023-02-02 NOTE — Assessment & Plan Note (Signed)
Living will d/w pt.  Husband designated if patient were incapacitated.   Tetanus- 2023.   Flu declined.  Covid prev encouraged.   Shingles- d/w pt.   PNA not due.  Mammogram and pap per gyn.  I'll defer and she agrees.    Colonoscopy 2022.   DXA not due.   Diet and exercise d/w pt.  D/w pt about restarting weight watchers.   HIV and HCV screening prev done ~2018.

## 2023-02-02 NOTE — Assessment & Plan Note (Signed)
WBC d/w pt.  PLT d/w pt.  No red flag sx.  D/w pt about checking peripheral smear.

## 2023-02-06 ENCOUNTER — Other Ambulatory Visit: Payer: Self-pay | Admitting: Family Medicine

## 2023-02-06 DIAGNOSIS — R7989 Other specified abnormal findings of blood chemistry: Secondary | ICD-10-CM

## 2023-02-07 ENCOUNTER — Telehealth: Payer: Self-pay | Admitting: Family Medicine

## 2023-02-07 NOTE — Telephone Encounter (Signed)
Patient returned call to the office, please call back at number on file [phone]

## 2023-02-07 NOTE — Telephone Encounter (Signed)
Results given to patient.  See result note  

## 2023-05-09 ENCOUNTER — Other Ambulatory Visit (INDEPENDENT_AMBULATORY_CARE_PROVIDER_SITE_OTHER): Payer: BC Managed Care – PPO

## 2023-05-09 DIAGNOSIS — R7989 Other specified abnormal findings of blood chemistry: Secondary | ICD-10-CM

## 2023-05-09 LAB — CBC WITH DIFFERENTIAL/PLATELET
Basophils Absolute: 0.1 10*3/uL (ref 0.0–0.1)
Basophils Relative: 1 % (ref 0.0–3.0)
Eosinophils Absolute: 0.4 10*3/uL (ref 0.0–0.7)
Eosinophils Relative: 4.2 % (ref 0.0–5.0)
HCT: 41.7 % (ref 36.0–46.0)
Hemoglobin: 13.6 g/dL (ref 12.0–15.0)
Lymphocytes Relative: 26.7 % (ref 12.0–46.0)
Lymphs Abs: 2.7 10*3/uL (ref 0.7–4.0)
MCHC: 32.5 g/dL (ref 30.0–36.0)
MCV: 90.6 fL (ref 78.0–100.0)
Monocytes Absolute: 0.8 10*3/uL (ref 0.1–1.0)
Monocytes Relative: 7.7 % (ref 3.0–12.0)
Neutro Abs: 6 10*3/uL (ref 1.4–7.7)
Neutrophils Relative %: 60.4 % (ref 43.0–77.0)
Platelets: 421 10*3/uL — ABNORMAL HIGH (ref 150.0–400.0)
RBC: 4.61 Mil/uL (ref 3.87–5.11)
RDW: 13.9 % (ref 11.5–15.5)
WBC: 10 10*3/uL (ref 4.0–10.5)

## 2023-05-11 DIAGNOSIS — Z1231 Encounter for screening mammogram for malignant neoplasm of breast: Secondary | ICD-10-CM | POA: Diagnosis not present

## 2023-05-11 DIAGNOSIS — Z124 Encounter for screening for malignant neoplasm of cervix: Secondary | ICD-10-CM | POA: Diagnosis not present

## 2023-05-11 DIAGNOSIS — Z01419 Encounter for gynecological examination (general) (routine) without abnormal findings: Secondary | ICD-10-CM | POA: Diagnosis not present

## 2023-05-11 LAB — HM MAMMOGRAPHY

## 2023-05-13 ENCOUNTER — Other Ambulatory Visit: Payer: Self-pay | Admitting: Obstetrics & Gynecology

## 2023-05-13 DIAGNOSIS — R928 Other abnormal and inconclusive findings on diagnostic imaging of breast: Secondary | ICD-10-CM

## 2023-05-16 LAB — HM PAP SMEAR: HPV, high-risk: NEGATIVE

## 2023-05-22 ENCOUNTER — Other Ambulatory Visit: Payer: Self-pay | Admitting: Family Medicine

## 2023-05-22 DIAGNOSIS — R7989 Other specified abnormal findings of blood chemistry: Secondary | ICD-10-CM

## 2023-06-03 ENCOUNTER — Encounter: Payer: Self-pay | Admitting: Internal Medicine

## 2023-06-03 ENCOUNTER — Inpatient Hospital Stay: Payer: BC Managed Care – PPO

## 2023-06-03 ENCOUNTER — Inpatient Hospital Stay: Payer: BC Managed Care – PPO | Attending: Oncology | Admitting: Internal Medicine

## 2023-06-03 VITALS — BP 150/100 | HR 85 | Temp 98.8°F | Resp 18 | Wt 207.0 lb

## 2023-06-03 DIAGNOSIS — C50511 Malignant neoplasm of lower-outer quadrant of right female breast: Secondary | ICD-10-CM | POA: Diagnosis not present

## 2023-06-03 DIAGNOSIS — Z833 Family history of diabetes mellitus: Secondary | ICD-10-CM | POA: Diagnosis not present

## 2023-06-03 DIAGNOSIS — Z803 Family history of malignant neoplasm of breast: Secondary | ICD-10-CM | POA: Diagnosis not present

## 2023-06-03 DIAGNOSIS — Z8249 Family history of ischemic heart disease and other diseases of the circulatory system: Secondary | ICD-10-CM | POA: Insufficient documentation

## 2023-06-03 DIAGNOSIS — N6325 Unspecified lump in the left breast, overlapping quadrants: Secondary | ICD-10-CM | POA: Diagnosis not present

## 2023-06-03 DIAGNOSIS — Z1721 Progesterone receptor positive status: Secondary | ICD-10-CM | POA: Insufficient documentation

## 2023-06-03 DIAGNOSIS — Z1732 Human epidermal growth factor receptor 2 negative status: Secondary | ICD-10-CM | POA: Diagnosis not present

## 2023-06-03 DIAGNOSIS — D75839 Thrombocytosis, unspecified: Secondary | ICD-10-CM | POA: Diagnosis not present

## 2023-06-03 DIAGNOSIS — Z170 Estrogen receptor positive status [ER+]: Secondary | ICD-10-CM | POA: Insufficient documentation

## 2023-06-03 DIAGNOSIS — Z8379 Family history of other diseases of the digestive system: Secondary | ICD-10-CM | POA: Diagnosis not present

## 2023-06-03 DIAGNOSIS — N6315 Unspecified lump in the right breast, overlapping quadrants: Secondary | ICD-10-CM | POA: Insufficient documentation

## 2023-06-03 DIAGNOSIS — Z79899 Other long term (current) drug therapy: Secondary | ICD-10-CM | POA: Insufficient documentation

## 2023-06-03 DIAGNOSIS — G43909 Migraine, unspecified, not intractable, without status migrainosus: Secondary | ICD-10-CM | POA: Diagnosis not present

## 2023-06-03 DIAGNOSIS — K219 Gastro-esophageal reflux disease without esophagitis: Secondary | ICD-10-CM | POA: Insufficient documentation

## 2023-06-03 DIAGNOSIS — Z881 Allergy status to other antibiotic agents status: Secondary | ICD-10-CM | POA: Diagnosis not present

## 2023-06-03 DIAGNOSIS — R928 Other abnormal and inconclusive findings on diagnostic imaging of breast: Secondary | ICD-10-CM | POA: Diagnosis not present

## 2023-06-03 DIAGNOSIS — Z9089 Acquired absence of other organs: Secondary | ICD-10-CM | POA: Insufficient documentation

## 2023-06-03 DIAGNOSIS — Z9049 Acquired absence of other specified parts of digestive tract: Secondary | ICD-10-CM | POA: Diagnosis not present

## 2023-06-03 DIAGNOSIS — R7989 Other specified abnormal findings of blood chemistry: Secondary | ICD-10-CM | POA: Diagnosis not present

## 2023-06-03 DIAGNOSIS — Z800 Family history of malignant neoplasm of digestive organs: Secondary | ICD-10-CM | POA: Insufficient documentation

## 2023-06-03 LAB — CBC WITH DIFFERENTIAL/PLATELET
Abs Immature Granulocytes: 0.07 10*3/uL (ref 0.00–0.07)
Basophils Absolute: 0.1 10*3/uL (ref 0.0–0.1)
Basophils Relative: 1 %
Eosinophils Absolute: 0.6 10*3/uL — ABNORMAL HIGH (ref 0.0–0.5)
Eosinophils Relative: 6 %
HCT: 43.6 % (ref 36.0–46.0)
Hemoglobin: 14.5 g/dL (ref 12.0–15.0)
Immature Granulocytes: 1 %
Lymphocytes Relative: 25 %
Lymphs Abs: 2.8 10*3/uL (ref 0.7–4.0)
MCH: 29.5 pg (ref 26.0–34.0)
MCHC: 33.3 g/dL (ref 30.0–36.0)
MCV: 88.8 fL (ref 80.0–100.0)
Monocytes Absolute: 0.7 10*3/uL (ref 0.1–1.0)
Monocytes Relative: 7 %
Neutro Abs: 6.7 10*3/uL (ref 1.7–7.7)
Neutrophils Relative %: 60 %
Platelets: 416 10*3/uL — ABNORMAL HIGH (ref 150–400)
RBC: 4.91 MIL/uL (ref 3.87–5.11)
RDW: 13 % (ref 11.5–15.5)
WBC: 11 10*3/uL — ABNORMAL HIGH (ref 4.0–10.5)
nRBC: 0 % (ref 0.0–0.2)

## 2023-06-03 LAB — IRON AND TIBC
Iron: 75 ug/dL (ref 28–170)
Saturation Ratios: 21 % (ref 10.4–31.8)
TIBC: 360 ug/dL (ref 250–450)
UIBC: 285 ug/dL

## 2023-06-03 LAB — SEDIMENTATION RATE: Sed Rate: 22 mm/h (ref 0–30)

## 2023-06-03 LAB — C-REACTIVE PROTEIN: CRP: 1.4 mg/dL — ABNORMAL HIGH (ref <1.0)

## 2023-06-03 LAB — FERRITIN: Ferritin: 76 ng/mL (ref 11–307)

## 2023-06-03 NOTE — Progress Notes (Signed)
 Patient is wanting to know if the elevated CBC is related to the recent Mammo results, (mass on her right breast)? Every now and then she might feel like a twinge sensation in the right breast area.  *Patient prefers to have future tests or scans to be scheduled here instead of [facility].

## 2023-06-03 NOTE — Progress Notes (Signed)
 [facility]  [phone] [phone]) [phone]  ID: Janet Lynn OB: [birth year]  MR#: [id number]  RDW#:[id number]  Patient Care Team: Cleatus Arlyss RAMAN, MD as PCP - General (Family Medicine) Rox Charleston, MD as Consulting Physician (Obstetrics and Gynecology) Clista Bimler, MD as Consulting Physician (Oncology)  REFERRING PROVIDER: Dr. Cleatus  REASON FOR REFERRAL: Elevated platelet count  HPI: Janet Lynn is a 53 y.o. female with past medical history of migraine, GERD referred to hematology for elevated platelet count.  At times, she has mildly elevated platelet count above 400.  But it has never been more than 450.  Mild fluctuations in WBC between normal to 12,000.  Hemoglobin is normal.  Denies any history of smoking.  Denies any rheumatologic conditions.  Denies history of splenectomy.  Screening mammogram from 05/11/2023 was abnormal in both breast.  Further workup with diagnostic mammogram and ultrasound is scheduled on January 16.  REVIEW OF SYSTEMS:   ROS  As per HPI. Otherwise, a complete review of systems is negative.  PAST MEDICAL HISTORY: Past Medical History:  Diagnosis Date   Allergy    GERD (gastroesophageal reflux disease)    History of hiatal hernia    Migraine with aura    PONV (postoperative nausea and vomiting)    Vertigo     PAST SURGICAL HISTORY: Past Surgical History:  Procedure Laterality Date   APPENDECTOMY  05/31/1985   BREAST REDUCTION SURGERY     CHOLECYSTECTOMY     COLONOSCOPY WITH PROPOFOL  N/A 05/12/2021   Procedure: COLONOSCOPY WITH PROPOFOL ;  Surgeon: Unk Corinn Skiff, MD;  Location: [facility] ENDOSCOPY;  Service: Gastroenterology;  Laterality: N/A;   ESOPHAGOGASTRODUODENOSCOPY (EGD) WITH PROPOFOL  N/A 05/12/2021   Procedure: ESOPHAGOGASTRODUODENOSCOPY (EGD) WITH PROPOFOL ;  Surgeon: Unk Corinn Skiff, MD;  Location: [facility] ENDOSCOPY;  Service: Gastroenterology;  Laterality: N/A;    ESOPHAGOGASTRODUODENOSCOPY (EGD) WITH PROPOFOL  N/A 02/23/2022   Procedure: ESOPHAGOGASTRODUODENOSCOPY (EGD) WITH BIOPSIES;  Surgeon: Unk Corinn Skiff, MD;  Location: [facility];  Service: Endoscopy;  Laterality: N/A;   ESOPHAGOGASTRODUODENOSCOPY (EGD) WITH PROPOFOL  N/A 05/28/2022   Procedure: ESOPHAGOGASTRODUODENOSCOPY (EGD) WITH PROPOFOL ;  Surgeon: Unk Corinn Skiff, MD;  Location: [facility] ENDOSCOPY;  Service: Gastroenterology;  Laterality: N/A;   THYROID  SURGERY  06/01/1987   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    TONSILLECTOMY     TUBAL LIGATION     bilateral    FAMILY HISTORY: Family History  Problem Relation Age of Onset   Hypertension Mother    Diabetes Mother    Colon cancer Neg Hx    Breast cancer Neg Hx     HEALTH MAINTENANCE: Social History   Tobacco Use   Smoking status: Never   Smokeless tobacco: Never  Vaping Use   Vaping status: Never Used  Substance Use Topics   Alcohol use: No   Drug use: No     Allergies  Allergen Reactions   Erythromycin Rash    Current Outpatient Medications  Medication Sig Dispense Refill   acetaminophen  (TYLENOL ) 500 MG tablet Take 2 tablets (1,000 mg total) by mouth every 6 (six) hours as needed for mild pain.     cyclobenzaprine  (FLEXERIL ) 5 MG tablet Take 1 tablet (5 mg total) by mouth 2 (two) times daily as needed (for migraines.  sedation caution.). 30 tablet 1   meclizine  (ANTIVERT ) 25 MG tablet Take 1/2 to one tablet prn vertigo 20 tablet 0   omeprazole  (PRILOSEC) 40 MG capsule Take 1 capsule (40 mg total) by mouth daily. 90 capsule 3  No current facility-administered medications for this visit.    OBJECTIVE: Vitals:   06/03/23 1128 06/03/23 1130  BP: (!) 154/101 (!) 150/100  Pulse: 85   Resp: 18   Temp: 98.8 F (37.1 C)   SpO2: 100%      Body mass index is 39.11 kg/m.      General: Well-developed, well-nourished, no acute distress. Eyes: Pink conjunctiva, anicteric sclera. HEENT: Normocephalic, moist  mucous membranes, clear oropharnyx. Lungs: Clear to auscultation bilaterally. Heart: Regular rate and rhythm. No rubs, murmurs, or gallops. Abdomen: Soft, nontender, nondistended. No organomegaly noted, normoactive bowel sounds. Musculoskeletal: No edema, cyanosis, or clubbing. Neuro: Alert, answering all questions appropriately. Cranial nerves grossly intact. Skin: No rashes or petechiae noted. Psych: Normal affect. Lymphatics: No cervical, calvicular, axillary or inguinal LAD.   LAB RESULTS:  Lab Results  Component Value Date   NA 137 01/25/2023   K 4.5 01/25/2023   CL 103 01/25/2023   CO2 28 01/25/2023   GLUCOSE 99 01/25/2023   BUN 19 01/25/2023   CREATININE 1.05 01/25/2023   CALCIUM 9.2 01/25/2023   PROT 6.8 01/25/2023   ALBUMIN 3.9 01/25/2023   AST 17 01/25/2023   ALT 21 01/25/2023   ALKPHOS 76 01/25/2023   BILITOT 0.3 01/25/2023   GFRNONAA >60 02/11/2021    Lab Results  Component Value Date   WBC 11.0 (H) 06/03/2023   NEUTROABS 6.7 06/03/2023   HGB 14.5 06/03/2023   HCT 43.6 06/03/2023   MCV 88.8 06/03/2023   PLT 416 (H) 06/03/2023    Lab Results  Component Value Date   TIBC 360 06/03/2023   FERRITIN 76 06/03/2023   IRONPCTSAT 21 06/03/2023     STUDIES: No results found.  ASSESSMENT AND PLAN:   [name] is a 53 y.o. female with pmh of migraine, GERD referred to hematology for elevated platelet count.  # Elevated platelet count -Likely reactive in nature  - At times, she has mildly elevated platelet count above 400.  But it has never been more than 450.  Mild fluctuations in WBC between normal to 12,000.  Hemoglobin is normal.  Denies any history of smoking.  Denies any rheumatologic conditions.  Denies history of splenectomy.  -Discussed with the patient that mildly higher platelet above 400 chronically is likely reactive in nature.  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  I will obtain iron  levels, ESR CRP.  Will obtain MPL panel also although my suspicion is low since her counts have never been more than 450.  I will follow-up with her in 2 weeks with video visit. I explained that it is only mildly increased and it should not be causing her any symptoms or any problems down the line.  She does not need any treatment for it.  # Abnormal screening mammogram -Scheduled for diagnostic mammogram and ultrasound on January 16.  Patient was concerned if elevated platelet was related to that is less likely since she has had it intermittently for the past 12 years.  Patient expressed understanding and was in agreement with this plan. She also understands that She can call clinic at any time with any questions, concerns, or complaints.   I spent a total of 45 minutes reviewing chart data, face-to-face evaluation with the patient, counseling and coordination of care as detailed above.  Genevia Rous, MD   06/03/2023 1:23 PM

## 2023-06-08 LAB — JAK2 V617F RFX CALR/MPL/E12-15

## 2023-06-08 LAB — CALR +MPL + E12-E15  (REFLEX)

## 2023-06-16 ENCOUNTER — Ambulatory Visit
Admission: RE | Admit: 2023-06-16 | Discharge: 2023-06-16 | Disposition: A | Discharge status: Home / Self Care | Payer: BC Managed Care – PPO | Source: Ambulatory Visit | Attending: Obstetrics & Gynecology | Admitting: Obstetrics & Gynecology

## 2023-06-16 ENCOUNTER — Other Ambulatory Visit: Payer: Self-pay | Admitting: Obstetrics & Gynecology

## 2023-06-16 DIAGNOSIS — R928 Other abnormal and inconclusive findings on diagnostic imaging of breast: Secondary | ICD-10-CM

## 2023-06-16 DIAGNOSIS — N6315 Unspecified lump in the right breast, overlapping quadrants: Secondary | ICD-10-CM | POA: Diagnosis not present

## 2023-06-16 DIAGNOSIS — N6002 Solitary cyst of left breast: Secondary | ICD-10-CM | POA: Diagnosis not present

## 2023-06-16 DIAGNOSIS — N6310 Unspecified lump in the right breast, unspecified quadrant: Secondary | ICD-10-CM

## 2023-06-16 DIAGNOSIS — N6313 Unspecified lump in the right breast, lower outer quadrant: Secondary | ICD-10-CM | POA: Diagnosis not present

## 2023-06-16 DIAGNOSIS — R92321 Mammographic fibroglandular density, right breast: Secondary | ICD-10-CM | POA: Diagnosis not present

## 2023-06-20 ENCOUNTER — Inpatient Hospital Stay (HOSPITAL_BASED_OUTPATIENT_CLINIC_OR_DEPARTMENT_OTHER): Payer: BC Managed Care – PPO | Admitting: Internal Medicine

## 2023-06-20 DIAGNOSIS — R7989 Other specified abnormal findings of blood chemistry: Secondary | ICD-10-CM

## 2023-06-21 ENCOUNTER — Telehealth: Payer: Self-pay | Admitting: *Deleted

## 2023-06-21 ENCOUNTER — Ambulatory Visit
Admission: RE | Admit: 2023-06-21 | Discharge: 2023-06-21 | Disposition: A | Discharge status: Home / Self Care | Payer: BC Managed Care – PPO | Source: Ambulatory Visit | Attending: Obstetrics & Gynecology | Admitting: Obstetrics & Gynecology

## 2023-06-21 DIAGNOSIS — R928 Other abnormal and inconclusive findings on diagnostic imaging of breast: Secondary | ICD-10-CM

## 2023-06-21 DIAGNOSIS — N6310 Unspecified lump in the right breast, unspecified quadrant: Secondary | ICD-10-CM

## 2023-06-21 DIAGNOSIS — N6313 Unspecified lump in the right breast, lower outer quadrant: Secondary | ICD-10-CM | POA: Diagnosis not present

## 2023-06-21 DIAGNOSIS — N6315 Unspecified lump in the right breast, overlapping quadrants: Secondary | ICD-10-CM | POA: Diagnosis not present

## 2023-06-21 HISTORY — PX: BREAST BIOPSY: SHX20

## 2023-06-21 NOTE — Telephone Encounter (Signed)
Per Dr. Mervyn Skeeters pt is having a breast biopsy today. If positive- she will need f/u with Dr. Winn Jock, RN made aware.

## 2023-06-22 ENCOUNTER — Encounter: Payer: Self-pay | Admitting: *Deleted

## 2023-06-22 LAB — SURGICAL PATHOLOGY

## 2023-06-22 NOTE — Progress Notes (Unsigned)
Received referral for newly diagnosed breast cancer from [facility].  Navigation initiated.  She will see Dr. Alena Bills on Monday.   She would also like to see Dr. Dwain Sarna, referral placed to [facility].

## 2023-06-24 NOTE — Progress Notes (Signed)
Janet Lynn  Telephone:([phone] [phone]  I connected with Janet Lynn on 06/24/23 at  3:00 PM EST by my chart video and verified that I am speaking with the correct person using two identifiers.   I discussed the limitations, risks, security and privacy concerns of performing an evaluation and management service by telemedicine and the availability of in-person appointments. I also discussed with the patient that there may be a patient responsible charge related to this service. The patient expressed understanding and agreed to proceed.   Other persons participating in the visit and their role in the encounter: none   Patient's location: home  Provider's location: clinic   Chief Complaint: discuss labs  ID: Janet Lynn OB: 07/31/1970  MR#: [id number]  AOZ#:[id number]  Patient Care Team: Joaquim Nam, MD as PCP - General (Family Medicine) Annamaria Helling, MD as Consulting Physician (Obstetrics and Gynecology) Michaelyn Barter, MD as Consulting Physician (Oncology)  REFERRING PROVIDER: Dr. Para March  REASON FOR REFERRAL: Elevated platelet count  HPI: Janet Lynn is a 53 y.o. female with past medical history of migraine, GERD referred to hematology for elevated platelet count.  At times, she has mildly elevated platelet count above 400.  But it has never been more than 450.  Mild fluctuations in WBC between normal to 12,000.  Hemoglobin is normal.  Denies any history of smoking.  Denies any rheumatologic conditions.  Denies history of splenectomy.  Screening mammogram from 05/11/2023 was abnormal in both breast.  Further workup with diagnostic mammogram and ultrasound is scheduled on January 16.  Interval history Connected via MyChart video visit with the patient. Anxious about her biopsy tomorrow.  Otherwise feeling okay.  REVIEW OF SYSTEMS:   ROS  As per HPI. Otherwise, a complete review of systems is negative.  PAST  MEDICAL HISTORY: Past Medical History:  Diagnosis Date   Allergy    GERD (gastroesophageal reflux disease)    History of hiatal hernia    Migraine with aura    PONV (postoperative nausea and vomiting)    Vertigo     PAST SURGICAL HISTORY: Past Surgical History:  Procedure Laterality Date   APPENDECTOMY  05/31/1985   BREAST BIOPSY Right 06/21/2023   [place] RT BREAST BX W LOC DEV 1ST LESION IMG BX SPEC US GUIDE 06/21/2023 [facility]   BREAST REDUCTION SURGERY     CHOLECYSTECTOMY     COLONOSCOPY WITH PROPOFOL N/A 05/12/2021   Procedure: COLONOSCOPY WITH PROPOFOL;  Surgeon: Toney Reil, MD;  Location: [facility] ENDOSCOPY;  Service: Gastroenterology;  Laterality: N/A;   ESOPHAGOGASTRODUODENOSCOPY (EGD) WITH PROPOFOL N/A 05/12/2021   Procedure: ESOPHAGOGASTRODUODENOSCOPY (EGD) WITH PROPOFOL;  Surgeon: Toney Reil, MD;  Location: [facility]COPY;  Service: Gastroenterology;  Laterality: N/A;   ESOPHAGOGASTRODUODENOSCOPY (EGD) WITH PROPOFOL N/A 02/23/2022   Procedure: ESOPHAGOGASTRODUODENOSCOPY (EGD) WITH BIOPSIES;  Surgeon: Toney Reil, MD;  Location: [facility];  Service: Endoscopy;  Laterality: N/A;   ESOPHAGOGASTRODUODENOSCOPY (EGD) WITH PROPOFOL N/A 05/28/2022   Procedure: ESOPHAGOGASTRODUODENOSCOPY (EGD) WITH PROPOFOL;  Surgeon: Toney Reil, MD;  Location: [facility];  Service: Gastroenterology;  Laterality: N/A;   THYROID SURGERY  06/01/1987   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   TONSILLECTOMY     TUBAL LIGATION     bilateral    FAMILY HISTORY: Family History  Problem Relation Age of Onset   Hypertension Mother    Diabetes Mother    Colon cancer Neg Hx    Breast cancer Neg Hx  HEALTH MAINTENANCE: Social History   Tobacco Use   Smoking status: Never   Smokeless tobacco: Never  Vaping Use   Vaping status: Never Used  Substance Use Topics   Alcohol use: No   Drug use: No     Allergies  Allergen Reactions   Erythromycin  Rash    Current Outpatient Medications  Medication Sig Dispense Refill   acetaminophen (TYLENOL) 500 MG tablet Take 2 tablets (1,000 mg total) by mouth every 6 (six) hours as needed for mild pain.     cyclobenzaprine (FLEXERIL) 5 MG tablet Take 1 tablet (5 mg total) by mouth 2 (two) times daily as needed (for migraines.  sedation caution.). 30 tablet 1   meclizine (ANTIVERT) 25 MG tablet Take 1/2 to one tablet prn vertigo 20 tablet 0   omeprazole (PRILOSEC) 40 MG capsule Take 1 capsule (40 mg total) by mouth daily. 90 capsule 3   No current facility-administered medications for this visit.    OBJECTIVE: There were no vitals filed for this visit.    There is no height or weight on file to calculate BMI.      Physical exam not performed.  Video visit.  LAB RESULTS:  Lab Results  Component Value Date   NA 137 01/25/2023   K 4.5 01/25/2023   CL 103 01/25/2023   CO2 28 01/25/2023   GLUCOSE 99 01/25/2023   BUN 19 01/25/2023   CREATININE 1.05 01/25/2023   CALCIUM 9.2 01/25/2023   PROT 6.8 01/25/2023   ALBUMIN 3.9 01/25/2023   AST 17 01/25/2023   ALT 21 01/25/2023   ALKPHOS 76 01/25/2023   BILITOT 0.3 01/25/2023   GFRNONAA >60 02/11/2021    Lab Results  Component Value Date   WBC 11.0 (H) 06/03/2023   NEUTROABS 6.7 06/03/2023   HGB 14.5 06/03/2023   HCT 43.6 06/03/2023   MCV 88.8 06/03/2023   PLT 416 (H) 06/03/2023    Lab Results  Component Value Date   TIBC 360 06/03/2023   FERRITIN 76 06/03/2023   IRONPCTSAT 21 06/03/2023     STUDIES: [place] RT BREAST BX W LOC DEV 1ST LESION IMG BX SPEC US GUIDE Addendum Date: 06/22/2023 ADDENDUM REPORT: 06/22/2023 12:11 ADDENDUM: 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 This was found to be concordant by Dr. Amie Portland. Pathology results were discussed with the patient by telephone. The patient reported doing well after the biopsy with tenderness and swelling at  the site. Post biopsy instructions and care were reviewed and questions were answered. The patient was encouraged to call [facility] for any additional concerns. My direct phone number was provided. Medical oncology consultation has been requested with Dr. Michaelyn Barter, per patient request, at [facility], [place] location, on June 22, 2023. Pathology results were sent to Irving Shows, RN Nurse Navigator with [facility] via secure EPIC message on June 22, 2023. Pathology results reported by Rene Kocher, RN on 06/22/2023. Electronically Signed   By: Amie Portland M.D.   On: 06/22/2023 12:11   Result Date: 06/22/2023 CLINICAL DATA:  Patient presents for ultrasound-guided core needle biopsy of a right breast mass. EXAM: ULTRASOUND GUIDED RIGHT BREAST CORE NEEDLE BIOPSY COMPARISON:  Previous exam(s). PROCEDURE: I met with the patient and we discussed the procedure of ultrasound-guided biopsy, including benefits and alternatives. We discussed the high likelihood of a successful procedure. We discussed the risks of the procedure, including infection,  bleeding, tissue injury, clip migration, and inadequate sampling. Informed written consent was given. The usual time-out protocol was performed immediately prior to the procedure. Lesion quadrant: Lower outer quadrant: Near 9 o'clock, 6 cm the nipple. Using sterile technique and 1% Lidocaine as local anesthetic, under direct ultrasound visualization, a 12 gauge spring-loaded device was used to perform biopsy of the 2 cm mass at 9 o'clock using an inferior approach. At the conclusion of the procedure a coil shaped tissue marker clip was deployed into the biopsy cavity. Follow up 2 view mammogram was performed and dictated separately. IMPRESSION: Ultrasound guided biopsy of a right breast mass. No apparent complications. Electronically Signed: By: Amie Portland M.D. On: 06/21/2023 14:28   MM CLIP PLACEMENT  RIGHT Result Date: 06/21/2023 CLINICAL DATA:  Assess post biopsy marker clip placement following ultrasound-guided core needle biopsy of a right breast mass. EXAM: 3D DIAGNOSTIC RIGHT MAMMOGRAM POST ULTRASOUND BIOPSY COMPARISON:  Previous exam(s). FINDINGS: 3D Mammographic images were obtained following ultrasound guided biopsy of a right breast mass. The biopsy marking clip is in expected position at the site of biopsy. IMPRESSION: Appropriate positioning of the coil shaped biopsy marking clip at the site of biopsy in the within the mass in the lateral right breast. Final Assessment: Post Procedure Mammograms for Marker Placement Electronically Signed   By: Amie Portland M.D.   On: 06/21/2023 14:47   MM 3D DIAGNOSTIC MAMMOGRAM BILATERAL BREAST Result Date: 06/16/2023 CLINICAL DATA:  Screening recall for possible right breast mass and possible left breast asymmetry. EXAM: DIGITAL DIAGNOSTIC BILATERAL MAMMOGRAM WITH TOMOSYNTHESIS AND CAD; ULTRASOUND LEFT BREAST LIMITED; ULTRASOUND RIGHT BREAST LIMITED TECHNIQUE: Bilateral digital diagnostic mammography and breast tomosynthesis was performed. The images were evaluated with computer-aided detection. ; Targeted ultrasound examination of the left breast was performed.; Targeted ultrasound examination of the right breast was performed COMPARISON:  Previous exam(s). ACR Breast Density Category b: There are scattered areas of fibroglandular density. FINDINGS: Additional tomograms were performed of the bilateral breast. There is a spiculated mass in the outer right breast measuring 1.7 cm. There is an oval circumscribed mass in the outer left breast measuring 0.7 cm. Physical examination of the outer right breast reveals a firm masslike area of thickening at the approximate 8:30 to 9 o'clock position. Targeted ultrasound of the right breast was performed. There is an irregular hypoechoic mass in the right breast at 9 o'clock 6 cm from nipple measuring 2 x 1.2 x 1.5 cm.  This corresponds well with the mass seen in the right breast at mammography. No abnormal lymph nodes identified in the right axilla. Targeted ultrasound of the left breast was performed. There is a cluster of microcysts in the left breast at 3 o'clock 3 cm from nipple measuring 0.8 x 0.4 x 0.8 cm. This corresponds well with the mass seen in the outer left breast at mammography. IMPRESSION: Suspicious 2 cm mass in the right breast at the 9 o'clock position. RECOMMENDATION: Recommend ultrasound-guided core biopsy of the mass in the outer right breast at the 9 o'clock position. I have discussed the findings and recommendations with the patient. If applicable, a reminder letter will be sent to the patient regarding the next appointment. BI-RADS CATEGORY  5: Highly suggestive of malignancy. Electronically Signed   By: Edwin Cap M.D.   On: 06/16/2023 10:11   [place] LIMITED ULTRASOUND INCLUDING AXILLA LEFT BREAST  Result Date: 06/16/2023 CLINICAL DATA:  Screening recall for possible right breast mass and possible left breast asymmetry. EXAM: DIGITAL  DIAGNOSTIC BILATERAL MAMMOGRAM WITH TOMOSYNTHESIS AND CAD; ULTRASOUND LEFT BREAST LIMITED; ULTRASOUND RIGHT BREAST LIMITED TECHNIQUE: Bilateral digital diagnostic mammography and breast tomosynthesis was performed. The images were evaluated with computer-aided detection. ; Targeted ultrasound examination of the left breast was performed.; Targeted ultrasound examination of the right breast was performed COMPARISON:  Previous exam(s). ACR Breast Density Category b: There are scattered areas of fibroglandular density. FINDINGS: Additional tomograms were performed of the bilateral breast. There is a spiculated mass in the outer right breast measuring 1.7 cm. There is an oval circumscribed mass in the outer left breast measuring 0.7 cm. Physical examination of the outer right breast reveals a firm masslike area of thickening at the approximate 8:30 to 9 o'clock position.  Targeted ultrasound of the right breast was performed. There is an irregular hypoechoic mass in the right breast at 9 o'clock 6 cm from nipple measuring 2 x 1.2 x 1.5 cm. This corresponds well with the mass seen in the right breast at mammography. No abnormal lymph nodes identified in the right axilla. Targeted ultrasound of the left breast was performed. There is a cluster of microcysts in the left breast at 3 o'clock 3 cm from nipple measuring 0.8 x 0.4 x 0.8 cm. This corresponds well with the mass seen in the outer left breast at mammography. IMPRESSION: Suspicious 2 cm mass in the right breast at the 9 o'clock position. RECOMMENDATION: Recommend ultrasound-guided core biopsy of the mass in the outer right breast at the 9 o'clock position. I have discussed the findings and recommendations with the patient. If applicable, a reminder letter will be sent to the patient regarding the next appointment. BI-RADS CATEGORY  5: Highly suggestive of malignancy. Electronically Signed   By: Edwin Cap M.D.   On: 06/16/2023 10:11   [place] LIMITED ULTRASOUND INCLUDING AXILLA RIGHT BREAST Result Date: 06/16/2023 CLINICAL DATA:  Screening recall for possible right breast mass and possible left breast asymmetry. EXAM: DIGITAL DIAGNOSTIC BILATERAL MAMMOGRAM WITH TOMOSYNTHESIS AND CAD; ULTRASOUND LEFT BREAST LIMITED; ULTRASOUND RIGHT BREAST LIMITED TECHNIQUE: Bilateral digital diagnostic mammography and breast tomosynthesis was performed. The images were evaluated with computer-aided detection. ; Targeted ultrasound examination of the left breast was performed.; Targeted ultrasound examination of the right breast was performed COMPARISON:  Previous exam(s). ACR Breast Density Category b: There are scattered areas of fibroglandular density. FINDINGS: Additional tomograms were performed of the bilateral breast. There is a spiculated mass in the outer right breast measuring 1.7 cm. There is an oval circumscribed mass in the outer  left breast measuring 0.7 cm. Physical examination of the outer right breast reveals a firm masslike area of thickening at the approximate 8:30 to 9 o'clock position. Targeted ultrasound of the right breast was performed. There is an irregular hypoechoic mass in the right breast at 9 o'clock 6 cm from nipple measuring 2 x 1.2 x 1.5 cm. This corresponds well with the mass seen in the right breast at mammography. No abnormal lymph nodes identified in the right axilla. Targeted ultrasound of the left breast was performed. There is a cluster of microcysts in the left breast at 3 o'clock 3 cm from nipple measuring 0.8 x 0.4 x 0.8 cm. This corresponds well with the mass seen in the outer left breast at mammography. IMPRESSION: Suspicious 2 cm mass in the right breast at the 9 o'clock position. RECOMMENDATION: Recommend ultrasound-guided core biopsy of the mass in the outer right breast at the 9 o'clock position. I have discussed the findings and recommendations with  the patient. If applicable, a reminder letter will be sent to the patient regarding the next appointment. BI-RADS CATEGORY  5: Highly suggestive of malignancy. Electronically Signed   By: Edwin Cap M.D.   On: 06/16/2023 10:11    ASSESSMENT AND PLAN:   Keimya Briddell is a 53 y.o. female with pmh of migraine, GERD referred to hematology for elevated platelet count.  # Elevated platelet count -Likely reactive in nature  -At times, she has mildly elevated platelet count above 400.  But it has never been more than 450.  Mild fluctuations in WBC between normal to 12,000.  Hemoglobin is normal.  Denies any history of smoking.  Denies any rheumatologic conditions.  Denies history of splenectomy.  -Iron panel normal.  CRP mildly elevated.  ESR normal.  JAK2 V617F/CAL R/MPL/exon 12-15 negative.  Likely reactive in nature.  Does not need any further workup..  # Abnormal screening mammogram -Scheduled for breast biopsy tomorrow. -Will follow-up  on the results and if positive for malignancy we will have her schedule follow-up with me.  RTC as needed for now  Patient expressed understanding and was in agreement with this plan. She also understands that She can call clinic at any time with any questions, concerns, or complaints.   I spent a total of 25 minutes reviewing chart data, face-to-face evaluation with the patient, counseling and coordination of care as detailed above.  Michaelyn Barter, MD   06/24/2023 4:23 PM

## 2023-06-27 ENCOUNTER — Encounter: Payer: Self-pay | Admitting: *Deleted

## 2023-06-27 ENCOUNTER — Inpatient Hospital Stay: Payer: BC Managed Care – PPO | Admitting: Internal Medicine

## 2023-06-27 ENCOUNTER — Other Ambulatory Visit: Payer: Self-pay

## 2023-06-27 ENCOUNTER — Encounter: Payer: Self-pay | Admitting: Internal Medicine

## 2023-06-27 VITALS — BP 144/98 | HR 81 | Temp 98.6°F | Resp 20 | Ht 61.0 in | Wt 206.6 lb

## 2023-06-27 DIAGNOSIS — C50911 Malignant neoplasm of unspecified site of right female breast: Secondary | ICD-10-CM

## 2023-06-27 DIAGNOSIS — N6325 Unspecified lump in the left breast, overlapping quadrants: Secondary | ICD-10-CM | POA: Diagnosis not present

## 2023-06-27 DIAGNOSIS — Z9089 Acquired absence of other organs: Secondary | ICD-10-CM | POA: Diagnosis not present

## 2023-06-27 DIAGNOSIS — Z833 Family history of diabetes mellitus: Secondary | ICD-10-CM | POA: Diagnosis not present

## 2023-06-27 DIAGNOSIS — K219 Gastro-esophageal reflux disease without esophagitis: Secondary | ICD-10-CM | POA: Diagnosis not present

## 2023-06-27 DIAGNOSIS — N6315 Unspecified lump in the right breast, overlapping quadrants: Secondary | ICD-10-CM | POA: Diagnosis not present

## 2023-06-27 DIAGNOSIS — G43909 Migraine, unspecified, not intractable, without status migrainosus: Secondary | ICD-10-CM | POA: Diagnosis not present

## 2023-06-27 DIAGNOSIS — Z170 Estrogen receptor positive status [ER+]: Secondary | ICD-10-CM | POA: Diagnosis not present

## 2023-06-27 DIAGNOSIS — C50919 Malignant neoplasm of unspecified site of unspecified female breast: Secondary | ICD-10-CM | POA: Insufficient documentation

## 2023-06-27 DIAGNOSIS — Z881 Allergy status to other antibiotic agents status: Secondary | ICD-10-CM | POA: Diagnosis not present

## 2023-06-27 DIAGNOSIS — D75839 Thrombocytosis, unspecified: Secondary | ICD-10-CM | POA: Diagnosis not present

## 2023-06-27 DIAGNOSIS — Z1732 Human epidermal growth factor receptor 2 negative status: Secondary | ICD-10-CM | POA: Diagnosis not present

## 2023-06-27 DIAGNOSIS — Z8249 Family history of ischemic heart disease and other diseases of the circulatory system: Secondary | ICD-10-CM | POA: Diagnosis not present

## 2023-06-27 DIAGNOSIS — Z1721 Progesterone receptor positive status: Secondary | ICD-10-CM | POA: Diagnosis not present

## 2023-06-27 DIAGNOSIS — C50511 Malignant neoplasm of lower-outer quadrant of right female breast: Secondary | ICD-10-CM | POA: Diagnosis not present

## 2023-06-27 DIAGNOSIS — Z79899 Other long term (current) drug therapy: Secondary | ICD-10-CM | POA: Diagnosis not present

## 2023-06-27 DIAGNOSIS — Z9049 Acquired absence of other specified parts of digestive tract: Secondary | ICD-10-CM | POA: Diagnosis not present

## 2023-06-27 DIAGNOSIS — R928 Other abnormal and inconclusive findings on diagnostic imaging of breast: Secondary | ICD-10-CM | POA: Diagnosis not present

## 2023-06-27 NOTE — Progress Notes (Signed)
Accompanied patient and family to initial medical oncology appointment.   Reviewed Breast Cancer treatment handbook.   Care plan summary given to patient.   Reviewed outreach programs and cancer center services.

## 2023-06-27 NOTE — Progress Notes (Unsigned)
[facility] CONSULT NOTE  Patient Care Team: Joaquim Nam, MD as PCP - General (Family Medicine) Annamaria Helling, MD as Consulting Physician (Obstetrics and Gynecology) Michaelyn Barter, MD as Consulting Physician (Oncology)  REASON FOR REFFERAL: right breast cancer  CANCER STAGING   Cancer Staging  Breast cancer [facility]) Staging form: Breast, AJCC 8th Edition - Clinical: Stage IB (cT2, cN0, cM0, G2, ER+, PR+, HER2-) - Signed by Michaelyn Barter, MD on 06/27/2023 Stage prefix: Initial diagnosis Histologic grading system: 3 grade system   ASSESSMENT & PLAN:  Janet [facility] 53 y.o. female with pmh of GERD, hiatal hernia, migraine follows with medical oncology for right breast IDC, ER/PR positive HER2 negative.  Clinical stage Ib.  # Right breast IDC, ER PR positive, HER2 negative -Detected on screening mammogram.  Diagnostic mammogram/ultrasound showed 2 x 1.2 x 1.5 cm mass in the right breast 9:00 6 cm from the nipple.  No abnormal lymph nodes identified in right axilla.  -Status post biopsy right breast 9:00 6 cm from nipple which showed invasive mammary carcinoma.  Overall grade 2.  ER 95% positive, PR 100% positive, HER2 1+ by IHC negative, Ki-67 5%.  Further addendum noted from pathology which showed E-cadherin negative supporting lobular features.  -Discussed with the patient and husband in detail about the staging, prognosis, treatment options.  Considering she has strongly hormone positive cancer with no lymph node involvement T2 in size would favor upfront surgery.  She is scheduled to see Dr. Dwain Sarna later this week.  I think tumor should be amenable to lumpectomy however she has not decided between lumpectomy versus mastectomy.  If she decides to proceed with lumpectomy, we will make referral to radiation oncology.  Will send tumor specimen for Oncotype DX testing to assess role for adjuvant chemotherapy.  She will benefit from endocrine therapy for at least 5  years.  Side effects such as hot flashes, aches and pains in joints and muscles, osteoporosis, increased cholesterol level, less commonly GI symptoms were discussed.  Will need DEXA scan at baseline and then repeat every 2 years.  Discussed about use of calcium vitamin D supplementation to help with bone health.    -Patient has family history of breast cancer.  Referral to genetics.   No orders of the defined types were placed in this encounter. RTC 2 to 3 weeks postsurgery.  The total time spent in the appointment was 55 minutes encounter with patients including review of chart and various tests results, discussions about plan of care and coordination of care plan   All questions were answered. The patient knows to call the clinic with any problems, questions or concerns. No barriers to learning was detected.  Michaelyn Barter, MD 1/28/202512:43 PM   HISTORY OF PRESENTING ILLNESS:  Janet [facility] 53 y.o. female with pmh of GERD, hiatal hernia, migraine follows with medical oncology for right breast IDC, ER/PR positive HER2 negative.  Clinical stage Ib.  She was accompanied today with her husband.  Anxious about the process.  Otherwise she has been feeling well overall.  I have reviewed her chart and materials related to her cancer extensively and collaborated history with the patient. Summary of oncologic history is as follows: Oncology History  Breast cancer (HCC)  06/21/2023 Pathology Results   FINAL DIAGNOSIS        1. Breast, right, needle core biopsy, 9:00 6cmfn coil clip :       -  INVASIVE MAMMARY CARCINOMA   The biopsy material  shows an infiltrative proliferation of cells with large vesicular nuclei with inconspicuous nucleoli, arranged linearly and in small clusters. Based on the biopsy, the carcinoma appears Nottingham grade 2 of 3 and measures 1.0 cm in greatest linear extent.   Results:  IMMUNOHISTOCHEMICAL AND MORPHOMETRIC ANALYSIS PERFORMED MANUALLY  The tumor cells  are negative for Her2 (1+).  Estrogen Receptor:  95%, POSITIVE, STRONG STAINING INTENSITY  Progesterone Receptor:  100%, POSITIVE, STRONG STAINING INTENSITY  Proliferation Marker Ki67:  5%    06/27/2023 Cancer Staging   Staging form: Breast, AJCC 8th Edition - Clinical: Stage IB (cT2, cN0, cM0, G2, ER+, PR+, HER2-) - Signed by Michaelyn Barter, MD on 06/27/2023 Stage prefix: Initial diagnosis Histologic grading system: 3 grade system    Mammogram   Screening mammogram from 05/13/2023 Showed abnormal mass in the right breast.  Diagnostic mammogram bilateral (06/16/2023) FINDINGS: Additional tomograms were performed of the bilateral breast. There is a spiculated mass in the outer right breast measuring 1.7 cm. There is an oval circumscribed mass in the outer left breast measuring 0.7 cm.   Physical examination of the outer right breast reveals a firm masslike area of thickening at the approximate 8:30 to 9 o'clock position.   Targeted ultrasound of the right breast was performed. There is an irregular hypoechoic mass in the right breast at 9 o'clock 6 cm from nipple measuring 2 x 1.2 x 1.5 cm. This corresponds well with the mass seen in the right breast at mammography. No abnormal lymph nodes identified in the right axilla.   Targeted ultrasound of the left breast was performed. There is a cluster of microcysts in the left breast at 3 o'clock 3 cm from nipple measuring 0.8 x 0.4 x 0.8 cm. This corresponds well with the mass seen in the outer left breast at mammography.   IMPRESSION: Suspicious 2 cm mass in the right breast at the 9 o'clock position.      Menarche age 11 2 children.  Age at first birth 36 years Birth control yes.  OCP less than 5 years. Menopause around age 26. History of breast biopsies no Paternal aunt x 2 -breast cancer. Paternal cousin breast cancer Paternal grandmother liver cancer  MEDICAL HISTORY:  Past Medical History:  Diagnosis Date   Allergy     Family history of breast cancer    GERD (gastroesophageal reflux disease)    History of hiatal hernia    Migraine with aura    PONV (postoperative nausea and vomiting)    Vertigo     SURGICAL HISTORY: Past Surgical History:  Procedure Laterality Date   APPENDECTOMY  05/31/1985   BREAST BIOPSY Right 06/21/2023   [place] RT BREAST BX W LOC DEV 1ST LESION IMG BX SPEC US GUIDE 06/21/2023 [facility]   BREAST REDUCTION SURGERY     CHOLECYSTECTOMY     COLONOSCOPY WITH PROPOFOL N/A 05/12/2021   Procedure: COLONOSCOPY WITH PROPOFOL;  Surgeon: Toney Reil, MD;  Location: [facility] END[facility];  Service: Gastroenterology;  Laterality: N/A;   ESOPHAGOGASTRODUODENOSCOPY (EGD) WITH PROPOFOL N/A 05/12/2021   Procedure: ESOPHAGOGASTRODUODENOSCOPY (EGD) WITH PROPOFOL;  Surgeon: Toney Reil, MD;  Location: [facility];  Service: Gastroenterology;  Laterality: N/A;   ESOPHAGOGASTRODUODENOSCOPY (EGD) WITH PROPOFOL N/A 02/23/2022   Procedure: ESOPHAGOGASTRODUODENOSCOPY (EGD) WITH BIOPSIES;  Surgeon: Toney Reil, MD;  Location: [facility];  Service: Endoscopy;  Laterality: N/A;   ESOPHAGOGASTRODUODENOSCOPY (EGD) WITH PROPOFOL N/A 05/28/2022   Procedure: ESOPHAGOGASTRODUODENOSCOPY (EGD) WITH PROPOFOL;  Surgeon: Toney Reil, MD;  Location: [facility] ENDOSCOPY;  Service: Gastroenterology;  Laterality: N/A;   THYROID SURGERY  06/01/1987   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   TONSILLECTOMY     TUBAL LIGATION     bilateral    SOCIAL HISTORY: Social History   Socioeconomic History   Marital status: Married    Spouse name: Not on file   Number of children: 2   Years of education: Not on file   Highest education level: Not on file  Occupational History    Employer: [employer]  Tobacco Use   Smoking status: Never   Smokeless tobacco: Never  Vaping Use   Vaping status: Never Used  Substance and Sexual Activity   Alcohol use: No   Drug use: No   Sexual activity:  Not on file  Other Topics Concern   Not on file  Social History Narrative   Married 1995 and lives with husband   2 boys, both adult (1 son is working as a Curator for the [employer])   Works at [employer] Strain: Not on file  Food Insecurity: No Food Insecurity (06/03/2023)   Hunger Vital Sign    Worried About Running Out of Food in the Last Year: Never true    Ran Out of Food in the Last Year: Never true  Transportation Needs: No Transportation Needs (06/03/2023)   PRAPARE - Administrator, Civil Service (Medical): No    Lack of Transportation (Non-Medical): No  Physical Activity: Not on file  Stress: Not on file  Social Connections: Not on file  Intimate Partner Violence: Not At Risk (06/03/2023)   Humiliation, Afraid, Rape, and Kick questionnaire    Fear of Current or Ex-Partner: No    Emotionally Abused: No    Physically Abused: No    Sexually Abused: No    FAMILY HISTORY: Family History  Problem Relation Age of Onset   Hypertension Mother    Diabetes Mother    Breast cancer Paternal Aunt        dx. 60s   Breast cancer Paternal Aunt        dx. 60s   Cirrhosis Paternal Uncle    Heart disease Maternal Grandmother    Heart attack Maternal Grandfather    Liver cancer Paternal Grandmother 74   Heart disease Paternal Grandfather    Breast cancer Other        PGM's sister   Breast cancer Cousin        father's maternal cousin   Breast cancer Cousin 7       paternal first cousin   Colon cancer Neg Hx     ALLERGIES:  is allergic to erythromycin.  MEDICATIONS:  Current Outpatient Medications  Medication Sig Dispense Refill   acetaminophen (TYLENOL) 500 MG tablet Take 2 tablets (1,000 mg total) by mouth every 6 (six) hours as needed for mild pain.     cyclobenzaprine (FLEXERIL) 5 MG tablet Take 1 tablet (5 mg total) by mouth 2 (two) times daily as needed (for migraines.  sedation caution.). 30  tablet 1   meclizine (ANTIVERT) 25 MG tablet Take 1/2 to one tablet prn vertigo 20 tablet 0   omeprazole (PRILOSEC) 40 MG capsule Take 1 capsule (40 mg total) by mouth daily. 90 capsule 3   No current facility-administered medications for this visit.    REVIEW OF SYSTEMS:   Pertinent information mentioned in HPI All other systems were  reviewed with the patient and are negative.  PHYSICAL EXAMINATION: ECOG PERFORMANCE STATUS: 1 - Symptomatic but completely ambulatory  Vitals:   06/27/23 1254 06/27/23 1330  BP: (!) 151/101 (!) 144/98  Pulse: 78 81  Resp: 20   Temp: 98.6 F (37 C)    Filed Weights   06/27/23 1254  Weight: 206 lb 9.6 oz (93.7 kg)    GENERAL:alert, no distress and comfortable SKIN: skin color, texture, turgor are normal, no rashes or significant lesions EYES: normal, conjunctiva are pink and non-injected, sclera clear OROPHARYNX:no exudate, no erythema and lips, buccal mucosa, and tongue normal  NECK: supple, thyroid normal size, non-tender, without nodularity LYMPH:  no palpable lymphadenopathy in the cervical, axillary or inguinal LUNGS: clear to auscultation and percussion with normal breathing effort HEART: regular rate & rhythm and no murmurs and no lower extremity edema ABDOMEN:abdomen soft, non-tender and normal bowel sounds Musculoskeletal:no cyanosis of digits and no clubbing  PSYCH: alert & oriented x 3 with fluent speech NEURO: no focal motor/sensory deficits  LABORATORY DATA:  I have reviewed the data as listed Lab Results  Component Value Date   WBC 11.0 (H) 06/03/2023   HGB 14.5 06/03/2023   HCT 43.6 06/03/2023   MCV 88.8 06/03/2023   PLT 416 (H) 06/03/2023   Recent Labs    01/25/23 0805  NA 137  K 4.5  CL 103  CO2 28  GLUCOSE 99  BUN 19  CREATININE 1.05  CALCIUM 9.2  PROT 6.8  ALBUMIN 3.9  AST 17  ALT 21  ALKPHOS 76  BILITOT 0.3    RADIOGRAPHIC STUDIES: I have personally reviewed the radiological images as listed and  agreed with the findings in the report. [place] RT BREAST BX W LOC DEV 1ST LESION IMG BX SPEC US GUIDE Addendum Date: 06/22/2023 ADDENDUM REPORT: 06/22/2023 12:11 ADDENDUM: 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 This was found to be concordant by Dr. Amie Portland. Pathology results were discussed with the patient by telephone. The patient reported doing well after the biopsy with tenderness and swelling at the site. Post biopsy instructions and care were reviewed and questions were answered. The patient was encouraged to call [facility] for any additional concerns. My direct phone number was provided. Medical oncology consultation has been requested with Dr. Michaelyn Barter, per patient request, at [facility], [place] location, on June 22, 2023. Pathology results were sent to Irving Shows, RN Nurse Navigator with [facility] via secure EPIC message on June 22, 2023. Pathology results reported by Rene Kocher, RN on 06/22/2023. Electronically Signed   By: Amie Portland M.D.   On: 06/22/2023 12:11   Result Date: 06/22/2023 CLINICAL DATA:  Patient presents for ultrasound-guided core needle biopsy of a right breast mass. EXAM: ULTRASOUND GUIDED RIGHT BREAST CORE NEEDLE BIOPSY COMPARISON:  Previous exam(s). PROCEDURE: I met with the patient and we discussed the procedure of ultrasound-guided biopsy, including benefits and alternatives. We discussed the high likelihood of a successful procedure. We discussed the risks of the procedure, including infection, bleeding, tissue injury, clip migration, and inadequate sampling. Informed written consent was given. The usual time-out protocol was performed immediately prior to the procedure. Lesion quadrant: Lower outer quadrant: Near 9 o'clock, 6 cm the nipple. Using sterile technique and 1% Lidocaine as local anesthetic, under direct ultrasound visualization,  a 12 gauge spring-loaded device was used to perform biopsy of the 2 cm mass  at 9 o'clock using an inferior approach. At the conclusion of the procedure a coil shaped tissue marker clip was deployed into the biopsy cavity. Follow up 2 view mammogram was performed and dictated separately. IMPRESSION: Ultrasound guided biopsy of a right breast mass. No apparent complications. Electronically Signed: By: Amie Portland M.D. On: 06/21/2023 14:28   MM CLIP PLACEMENT RIGHT Result Date: 06/21/2023 CLINICAL DATA:  Assess post biopsy marker clip placement following ultrasound-guided core needle biopsy of a right breast mass. EXAM: 3D DIAGNOSTIC RIGHT MAMMOGRAM POST ULTRASOUND BIOPSY COMPARISON:  Previous exam(s). FINDINGS: 3D Mammographic images were obtained following ultrasound guided biopsy of a right breast mass. The biopsy marking clip is in expected position at the site of biopsy. IMPRESSION: Appropriate positioning of the coil shaped biopsy marking clip at the site of biopsy in the within the mass in the lateral right breast. Final Assessment: Post Procedure Mammograms for Marker Placement Electronically Signed   By: Amie Portland M.D.   On: 06/21/2023 14:47   MM 3D DIAGNOSTIC MAMMOGRAM BILATERAL BREAST Result Date: 06/16/2023 CLINICAL DATA:  Screening recall for possible right breast mass and possible left breast asymmetry. EXAM: DIGITAL DIAGNOSTIC BILATERAL MAMMOGRAM WITH TOMOSYNTHESIS AND CAD; ULTRASOUND LEFT BREAST LIMITED; ULTRASOUND RIGHT BREAST LIMITED TECHNIQUE: Bilateral digital diagnostic mammography and breast tomosynthesis was performed. The images were evaluated with computer-aided detection. ; Targeted ultrasound examination of the left breast was performed.; Targeted ultrasound examination of the right breast was performed COMPARISON:  Previous exam(s). ACR Breast Density Category b: There are scattered areas of fibroglandular density. FINDINGS: Additional tomograms were performed of the bilateral  breast. There is a spiculated mass in the outer right breast measuring 1.7 cm. There is an oval circumscribed mass in the outer left breast measuring 0.7 cm. Physical examination of the outer right breast reveals a firm masslike area of thickening at the approximate 8:30 to 9 o'clock position. Targeted ultrasound of the right breast was performed. There is an irregular hypoechoic mass in the right breast at 9 o'clock 6 cm from nipple measuring 2 x 1.2 x 1.5 cm. This corresponds well with the mass seen in the right breast at mammography. No abnormal lymph nodes identified in the right axilla. Targeted ultrasound of the left breast was performed. There is a cluster of microcysts in the left breast at 3 o'clock 3 cm from nipple measuring 0.8 x 0.4 x 0.8 cm. This corresponds well with the mass seen in the outer left breast at mammography. IMPRESSION: Suspicious 2 cm mass in the right breast at the 9 o'clock position. RECOMMENDATION: Recommend ultrasound-guided core biopsy of the mass in the outer right breast at the 9 o'clock position. I have discussed the findings and recommendations with the patient. If applicable, a reminder letter will be sent to the patient regarding the next appointment. BI-RADS CATEGORY  5: Highly suggestive of malignancy. Electronically Signed   By: Edwin Cap M.D.   On: 06/16/2023 10:11   [place] LIMITED ULTRASOUND INCLUDING AXILLA LEFT BREAST  Result Date: 06/16/2023 CLINICAL DATA:  Screening recall for possible right breast mass and possible left breast asymmetry. EXAM: DIGITAL DIAGNOSTIC BILATERAL MAMMOGRAM WITH TOMOSYNTHESIS AND CAD; ULTRASOUND LEFT BREAST LIMITED; ULTRASOUND RIGHT BREAST LIMITED TECHNIQUE: Bilateral digital diagnostic mammography and breast tomosynthesis was performed. The images were evaluated with computer-aided detection. ; Targeted ultrasound examination of the left breast was performed.; Targeted ultrasound examination of the right breast was performed COMPARISON:   Previous exam(s). ACR Breast Density Category b: There are scattered areas  of fibroglandular density. FINDINGS: Additional tomograms were performed of the bilateral breast. There is a spiculated mass in the outer right breast measuring 1.7 cm. There is an oval circumscribed mass in the outer left breast measuring 0.7 cm. Physical examination of the outer right breast reveals a firm masslike area of thickening at the approximate 8:30 to 9 o'clock position. Targeted ultrasound of the right breast was performed. There is an irregular hypoechoic mass in the right breast at 9 o'clock 6 cm from nipple measuring 2 x 1.2 x 1.5 cm. This corresponds well with the mass seen in the right breast at mammography. No abnormal lymph nodes identified in the right axilla. Targeted ultrasound of the left breast was performed. There is a cluster of microcysts in the left breast at 3 o'clock 3 cm from nipple measuring 0.8 x 0.4 x 0.8 cm. This corresponds well with the mass seen in the outer left breast at mammography. IMPRESSION: Suspicious 2 cm mass in the right breast at the 9 o'clock position. RECOMMENDATION: Recommend ultrasound-guided core biopsy of the mass in the outer right breast at the 9 o'clock position. I have discussed the findings and recommendations with the patient. If applicable, a reminder letter will be sent to the patient regarding the next appointment. BI-RADS CATEGORY  5: Highly suggestive of malignancy. Electronically Signed   By: Edwin Cap M.D.   On: 06/16/2023 10:11   [place] LIMITED ULTRASOUND INCLUDING AXILLA RIGHT BREAST Result Date: 06/16/2023 CLINICAL DATA:  Screening recall for possible right breast mass and possible left breast asymmetry. EXAM: DIGITAL DIAGNOSTIC BILATERAL MAMMOGRAM WITH TOMOSYNTHESIS AND CAD; ULTRASOUND LEFT BREAST LIMITED; ULTRASOUND RIGHT BREAST LIMITED TECHNIQUE: Bilateral digital diagnostic mammography and breast tomosynthesis was performed. The images were evaluated with  computer-aided detection. ; Targeted ultrasound examination of the left breast was performed.; Targeted ultrasound examination of the right breast was performed COMPARISON:  Previous exam(s). ACR Breast Density Category b: There are scattered areas of fibroglandular density. FINDINGS: Additional tomograms were performed of the bilateral breast. There is a spiculated mass in the outer right breast measuring 1.7 cm. There is an oval circumscribed mass in the outer left breast measuring 0.7 cm. Physical examination of the outer right breast reveals a firm masslike area of thickening at the approximate 8:30 to 9 o'clock position. Targeted ultrasound of the right breast was performed. There is an irregular hypoechoic mass in the right breast at 9 o'clock 6 cm from nipple measuring 2 x 1.2 x 1.5 cm. This corresponds well with the mass seen in the right breast at mammography. No abnormal lymph nodes identified in the right axilla. Targeted ultrasound of the left breast was performed. There is a cluster of microcysts in the left breast at 3 o'clock 3 cm from nipple measuring 0.8 x 0.4 x 0.8 cm. This corresponds well with the mass seen in the outer left breast at mammography. IMPRESSION: Suspicious 2 cm mass in the right breast at the 9 o'clock position. RECOMMENDATION: Recommend ultrasound-guided core biopsy of the mass in the outer right breast at the 9 o'clock position. I have discussed the findings and recommendations with the patient. If applicable, a reminder letter will be sent to the patient regarding the next appointment. BI-RADS CATEGORY  5: Highly suggestive of malignancy. Electronically Signed   By: Edwin Cap M.D.   On: 06/16/2023 10:11

## 2023-06-28 ENCOUNTER — Inpatient Hospital Stay (HOSPITAL_BASED_OUTPATIENT_CLINIC_OR_DEPARTMENT_OTHER): Payer: BC Managed Care – PPO | Admitting: Genetic Counselor

## 2023-06-28 ENCOUNTER — Encounter: Payer: Self-pay | Admitting: Genetic Counselor

## 2023-06-28 ENCOUNTER — Other Ambulatory Visit: Payer: Self-pay | Admitting: Genetic Counselor

## 2023-06-28 DIAGNOSIS — Z803 Family history of malignant neoplasm of breast: Secondary | ICD-10-CM | POA: Insufficient documentation

## 2023-06-28 DIAGNOSIS — Z170 Estrogen receptor positive status [ER+]: Secondary | ICD-10-CM

## 2023-06-28 DIAGNOSIS — C50911 Malignant neoplasm of unspecified site of right female breast: Secondary | ICD-10-CM | POA: Diagnosis not present

## 2023-06-28 NOTE — Progress Notes (Signed)
REFERRING PROVIDER: Michaelyn Barter, MD [address] [place],  [place] [address]  PRIMARY PROVIDER:  Joaquim Nam, MD  PRIMARY REASON FOR VISIT:  1. Family history of breast cancer   2. Malignant neoplasm of right breast in female, estrogen receptor positive, unspecified site of breast (HCC)      HISTORY OF PRESENT ILLNESS:  I connected with  [name] on 06/28/2023 at 9:00 AM EDT by MyChart video conference and verified that I am speaking with the correct person using two identifiers.   Patient location: Work Provider location: [facility]  [name], a 53 y.o. female, was seen for a [place] cancer genetics consultation at the request of Dr. Alena Bills due to a personal and family history of breast cancer.  [name] presents to clinic today to discuss the possibility of a hereditary predisposition to cancer, genetic testing, and to further clarify her future cancer risks, as well as potential cancer risks for family members.   In January 2025, at the age of 7, [name] was diagnosed with cancer of the right breast. The treatment plan includes lumpectomy and radiation.  She is here for genetic testing.     CANCER HISTORY:  Oncology History  Breast cancer (HCC)  06/27/2023 Initial Diagnosis   Breast cancer (HCC)   06/27/2023 Cancer Staging   Staging form: Breast, AJCC 8th Edition - Clinical: Stage IB (cT2, cN0, cM0, G2, ER+, PR+, HER2-) - Signed by Michaelyn Barter, MD on 06/27/2023 Stage prefix: Initial diagnosis Histologic grading system: 3 grade system      RISK FACTORS:  Menarche was at age 69.  First live birth at age 46.   Ovaries intact: yes.  Hysterectomy: no.  Menopausal status: postmenopausal.  HRT use: 0 years. Colonoscopy: yes; normal. Mammogram within the last year: yes. Number of breast biopsies: 1. Up to date with pelvic exams: yes. Any excessive radiation exposure in the past: no  Past Medical History:  Diagnosis Date    Allergy    Family history of breast cancer    GERD (gastroesophageal reflux disease)    History of hiatal hernia    Migraine with aura    PONV (postoperative nausea and vomiting)    Vertigo     Past Surgical History:  Procedure Laterality Date   APPENDECTOMY  05/31/1985   BREAST BIOPSY Right 06/21/2023   [place] RT BREAST BX W LOC DEV 1ST LESION IMG BX SPEC US GUIDE 06/21/2023 [facility]   BREAST REDUCTION SURGERY     CHOLECYSTECTOMY     COLONOSCOPY WITH PROPOFOL N/A 05/12/2021   Procedure: COLONOSCOPY WITH PROPOFOL;  Surgeon: Toney Reil, MD;  Location: [facility] ENDOSCOPY;  Service: Gastroenterology;  Laterality: N/A;   ESOPHAGOGASTRODUODENOSCOPY (EGD) WITH PROPOFOL N/A 05/12/2021   Procedure: ESOPHAGOGASTRODUODENOSCOPY (EGD) WITH PROPOFOL;  Surgeon: Toney Reil, MD;  Location: [facility] ENDOSCOPY;  Service: Gastroenterology;  Laterality: N/A;   ESOPHAGOGASTRODUODENOSCOPY (EGD) WITH PROPOFOL N/A 02/23/2022   Procedure: ESOPHAGOGASTRODUODENOSCOPY (EGD) WITH BIOPSIES;  Surgeon: Toney Reil, MD;  Location: [facility];  Service: Endoscopy;  Laterality: N/A;   ESOPHAGOGASTRODUODENOSCOPY (EGD) WITH PROPOFOL N/A 05/28/2022   Procedure: ESOPHAGOGASTRODUODENOSCOPY (EGD) WITH PROPOFOL;  Surgeon: Toney Reil, MD;  Location: [facility] ENDOSCOPY;  Service: Gastroenterology;  Laterality: N/A;   THYROID SURGERY  06/01/1987   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   TONSILLECTOMY     TUBAL LIGATION     bilateral    Social History   Socioeconomic History   Marital status:  Married    Spouse name: Not on file   Number of children: 2   Years of education: Not on file   Highest education level: Not on file  Occupational History    Employer: [employer]  Tobacco Use   Smoking status: Never   Smokeless tobacco: Never  Vaping Use   Vaping status: Never Used  Substance and Sexual Activity   Alcohol use: No   Drug use: No   Sexual activity: Not on file  Other Topics  Concern   Not on file  Social History Narrative   Married 1995 and lives with husband   2 boys, both adult (1 son is working as a Curator for the [employer])   Works at [employer] Strain: Not on file  Food Insecurity: No Food Insecurity (06/03/2023)   Hunger Vital Sign    Worried About Running Out of Food in the Last Year: Never true    Ran Out of Food in the Last Year: Never true  Transportation Needs: No Transportation Needs (06/03/2023)   PRAPARE - Administrator, Civil Service (Medical): No    Lack of Transportation (Non-Medical): No  Physical Activity: Not on file  Stress: Not on file  Social Connections: Not on file     FAMILY HISTORY:  We obtained a detailed, 4-generation family history.  Significant diagnoses are listed below: Family History  Problem Relation Age of Onset   Hypertension Mother    Diabetes Mother    Breast cancer Paternal Aunt        dx. 60s   Breast cancer Paternal Aunt        dx. 60s   Cirrhosis Paternal Uncle    Heart disease Maternal Grandmother    Heart attack Maternal Grandfather    Liver cancer Paternal Grandmother 46   Heart disease Paternal Grandfather    Breast cancer Other        PGM's sister   Breast cancer Cousin        father's maternal cousin   Breast cancer Cousin 94       paternal first cousin   Colon cancer Neg Hx      The patient has two sons who are cancer free.  She has a maternal half brother who is cancer free.  Both parents are living.  The patient's mother does not have cancer.  She has one sister who is cancer free.  There is no reported family history of cancer on the maternal side.  The patient's father is living.  He has two brothers and two sisters.  Both sister had breast cancer in their 57's and one has a daughter with breast cancer at 23.  This cousin reportedly had negative genetic testing.  We do not have a report to view. The paternal  grandparents are deceased.  The grandmother reportedly had liver cancer at 95.  She had a sister who had breast cancer and that sister had a daughter with breast cancer.  [name] is aware of previous family history of genetic testing for hereditary cancer risks. There is no reported Ashkenazi Jewish ancestry. There is no known consanguinity.  GENETIC COUNSELING ASSESSMENT: Ms. Brogan is a 53 y.o. female with a personal and family history of breast cancer which is somewhat suggestive of a hereditary cancer syndrome and predisposition to cancer given the number of breast cancer cases in the family. We, therefore, discussed and  recommended the following at today's visit.   DISCUSSION: We discussed that, in general, most cancer is not inherited in families, but instead is sporadic or familial. Sporadic cancers occur by chance and typically happen at older ages (>50 years) as this type of cancer is caused by genetic changes acquired during an individual's lifetime. Some families have more cancers than would be expected by chance; however, the ages or types of cancer are not consistent with a known genetic mutation or known genetic mutations have been ruled out. This type of familial cancer is thought to be due to a combination of multiple genetic, environmental, hormonal, and lifestyle factors. While this combination of factors likely increases the risk of cancer, the exact source of this risk is not currently identifiable or testable.  We discussed that 5 - 10% of breast cancer is hereditary, with most cases associated with BRCA mutations.  There are other genes that can be associated with hereditary breast cancer syndromes.  These include ATM, CHEK2 and PALB2.  We discussed that testing is beneficial for several reasons including knowing how to follow individuals after completing their treatment, and understanding if other family members could be at risk for cancer and allow them to undergo genetic  testing.   We reviewed the characteristics, features and inheritance patterns of hereditary cancer syndromes. We also discussed genetic testing, including the appropriate family members to test, the process of testing, insurance coverage and turn-around-time for results. We discussed the implications of a negative, positive, carrier and/or variant of uncertain significant result. Ms. Beshara  was offered a common hereditary cancer panel (36+ genes) and an expanded pan-cancer panel (70+ genes). Ms. Straley was informed of the benefits and limitations of each panel, including that expanded pan-cancer panels contain genes that do not have clear management guidelines at this point in time.  We also discussed that as the number of genes included on a panel increases, the chances of variants of uncertain significance increases. In order to get genetic test results in a timely manner so that Ms. Crace can use these genetic test results for surgical decisions, we recommended Ms. Delmar pursue genetic testing for the BRCAPlus panel. Once complete, we recommend Ms. Mcphatter pursue reflex genetic testing to the CancerNext-Expanded+RNAinsight gene panel.   The CancerNext-Expanded gene panel offered by [facility] and includes sequencing, rearrangement, and RNA analysis for the following 76 genes: AIP, ALK, APC, ATM, AXIN2, BAP1, BARD1, BMPR1A, BRCA1, BRCA2, BRIP1, CDC73, CDH1, CDK4, CDKN1B, CDKN2A, CEBPA, CHEK2, CTNNA1, DDX41, DICER1, ETV6, FH, FLCN, GATA2, LZTR1, MAX, MBD4, MEN1, MET, MLH1, MSH2, MSH3, MSH6, MUTYH, NF1, NF2, NTHL1, PALB2, PHOX2B, PMS2, POT1, PRKAR1A, PTCH1, PTEN, RAD51C, RAD51D, RB1, RET, RUNX1, SDHA, SDHAF2, SDHB, SDHC, SDHD, SMAD4, SMARCA4, SMARCB1, SMARCE1, STK11, SUFU, TMEM127, TP53, TSC1, TSC2, VHL, and WT1 (sequencing and deletion/duplication); EGFR, HOXB13, KIT, MITF, PDGFRA, POLD1, and POLE (sequencing only); EPCAM and GREM1 (deletion/duplication only).    Based on Ms. Yeakel's personal and  family history of cancer, she meets medical criteria for genetic testing. Despite that she meets criteria, she may still have an out of pocket cost. We discussed that if her out of pocket cost for testing is over $100, the laboratory will call and confirm whether she wants to proceed with testing.  If the out of pocket cost of testing is less than $100 she will be billed by the genetic testing laboratory.   We discussed that some people do not want to undergo genetic testing due to fear of genetic discrimination.  The Genetic Information Nondiscrimination Act (GINA) was signed into federal law in 2008. GINA prohibits health insurers and most employers from discriminating against individuals based on genetic information (including the results of genetic tests and family history information). According to GINA, health insurance companies cannot consider genetic information to be a preexisting condition, nor can they use it to make decisions regarding coverage or rates. GINA also makes it illegal for most employers to use genetic information in making decisions about hiring, firing, promotion, or terms of employment. It is important to note that GINA does not offer protections for life insurance, disability insurance, or long-term care insurance. GINA does not apply to those in the [employer], those who work for companies with less than 15 employees, and new life insurance or long-term disability insurance policies.  Health status due to a cancer diagnosis is not protected under GINA. More information about GINA can be found by visiting [email or web address].  PLAN: After considering the risks, benefits, and limitations, Ms. Stalzer provided informed consent to pursue genetic testing and the blood sample was sent to [facility] for analysis of the CancerNext-Expanded+RNAinsight. Results should be available within approximately 2-3 weeks' time, at which point they will be disclosed by telephone to Ms. Hiraldo, as  will any additional recommendations warranted by these results. Ms. Wertheim will receive a summary of her genetic counseling visit and a copy of her results once available. This information will also be available in Epic.   Lastly, we encouraged Ms. Culhane to remain in contact with cancer genetics annually so that we can continuously update the family history and inform her of any changes in cancer genetics and testing that may be of benefit for this family.   Ms. Fessel questions were answered to her satisfaction today. Our contact information was provided should additional questions or concerns arise. Thank you for the referral and allowing [place] to share in the care of your patient.   Artavius Stearns P. Lowell Guitar, MS, CGC Licensed, Patent attorney [email or web address] phone: [phone]  55 minutes were spent on the date of the encounter in service to the patient including preparation, face-to-face consultation, documentation and care coordination.  The patient was seen alone.  Drs. Meliton Rattan, and/or Twining were available for questions, if needed..    _______________________________________________________________________ For Office Staff:  Number of people involved in session: 1 Was an Intern/ student involved with case: no

## 2023-06-29 ENCOUNTER — Inpatient Hospital Stay: Payer: BC Managed Care – PPO

## 2023-06-29 DIAGNOSIS — C50911 Malignant neoplasm of unspecified site of right female breast: Secondary | ICD-10-CM

## 2023-06-29 LAB — GENETIC SCREENING ORDER

## 2023-06-30 ENCOUNTER — Other Ambulatory Visit: Payer: Self-pay | Admitting: General Surgery

## 2023-06-30 DIAGNOSIS — C50412 Malignant neoplasm of upper-outer quadrant of left female breast: Secondary | ICD-10-CM

## 2023-06-30 DIAGNOSIS — Z170 Estrogen receptor positive status [ER+]: Secondary | ICD-10-CM | POA: Diagnosis not present

## 2023-06-30 DIAGNOSIS — C50411 Malignant neoplasm of upper-outer quadrant of right female breast: Secondary | ICD-10-CM | POA: Diagnosis not present

## 2023-07-01 ENCOUNTER — Encounter: Payer: Self-pay | Admitting: *Deleted

## 2023-07-01 DIAGNOSIS — C50911 Malignant neoplasm of unspecified site of right female breast: Secondary | ICD-10-CM

## 2023-07-01 NOTE — Progress Notes (Signed)
Janet Lynn is scheduled for lumpectomy on 2/18.   She will see Dr. Alena Bills and Dr. Rushie Chestnut on 3/11.   Appt. Details given to her.

## 2023-07-06 ENCOUNTER — Ambulatory Visit: Payer: BC Managed Care – PPO | Attending: Oncology | Admitting: Occupational Therapy

## 2023-07-06 ENCOUNTER — Encounter: Payer: Self-pay | Admitting: Occupational Therapy

## 2023-07-06 DIAGNOSIS — R293 Abnormal posture: Secondary | ICD-10-CM | POA: Diagnosis not present

## 2023-07-06 NOTE — Therapy (Signed)
 OUTPATIENT OCCUPATIONAL THERAPY BREAST CANCER BASELINE EVALUATION   Patient Name: Janet Lynn MRN: [id number] DOB:1971-05-12, 53 y.o., female Today's Date: 07/06/2023  END OF SESSION:  OT End of Session - 07/06/23 1756     Visit Number 1    Number of Visits 4    Date for OT Re-Evaluation 08/31/23    OT Start Time 1501    OT Stop Time 1527    OT Time Calculation (min) 26 min    Activity Tolerance Patient tolerated treatment well    Behavior During Therapy [facility] for tasks assessed/performed             Past Medical History:  Diagnosis Date   Allergy    Family history of breast cancer    GERD (gastroesophageal reflux disease)    History of hiatal hernia    Migraine with aura    PONV (postoperative nausea and vomiting)    Vertigo    Past Surgical History:  Procedure Laterality Date   APPENDECTOMY  05/31/1985   BREAST BIOPSY Right 06/21/2023   US  RT BREAST BX W LOC DEV 1ST LESION IMG BX SPEC US  GUIDE 06/21/2023 [facility]MO[facility]   BREAST REDUCTION SURGERY     CHOLECYSTECTOMY     COLONOSCOPY WITH PROPOFOL  N/A 05/12/2021   Procedure: COLONOSCOPY WITH PROPOFOL ;  Surgeon: Unk Corinn Skiff, MD;  Location: [facility] ENDOSCOPY;  Service: Gastroenterology;  Laterality: N/A;   ESOPHAGOGASTRODUODENOSCOPY (EGD) WITH PROPOFOL  N/A 05/12/2021   Procedure: ESOPHAGOGASTRODUODENOSCOPY (EGD) WITH PROPOFOL ;  Surgeon: Unk Corinn Skiff, MD;  Location: [facility] ENDOSCOPY;  Service: Gastroenterology;  Laterality: N/A;   ESOPHAGOGASTRODUODENOSCOPY (EGD) WITH PROPOFOL  N/A 02/23/2022   Procedure: ESOPHAGOGASTRODUODENOSCOPY (EGD) WITH BIOPSIES;  Surgeon: Unk Corinn Skiff, MD;  Location: [facility];  Service: Endoscopy;  Laterality: N/A;   ESOPHAGOGASTRODUODENOSCOPY (EGD) WITH PROPOFOL  N/A 05/28/2022   Procedure: ESOPHAGOGASTRODUODENOSCOPY (EGD) WITH PROPOFOL ;  Surgeon: Unk Corinn Skiff, MD;  Location: [facility] ENDOSCOPY;  Service: Gastroenterology;  Laterality: N/A;   THYROID  SURGERY   06/01/1987   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    TONSILLECTOMY     TUBAL LIGATION     bilateral   Patient Active Problem List   Diagnosis Date Noted   Family history of breast cancer    Breast cancer (HCC) 06/27/2023   Elevated platelet count 06/03/2023   Abnormal CBC 02/02/2023   Ulcer of esophagus without bleeding    Hiatal hernia with GERD without esophagitis    Advance care planning 01/27/2022   Dysphagia 01/27/2022   Epistaxis 01/27/2022   Migraine with aura 01/27/2022   Allergic rhinitis due to allergen 01/08/2022   Benign paroxysmal positional vertigo, right 01/08/2022   Erosive esophagitis    Routine general medical examination at a health care facility    Obesity with body mass index greater than 30 04/06/2021    PCP: Dr Cleatus  REFERRING PROVIDER: Dr Clista  REFERRING DIAG: R breast Cancer  THERAPY DIAG:  Abnormal posture  Rationale for Evaluation and Treatment: Rehabilitation  ONSET DATE: 06/27/23  SUBJECTIVE:  SUBJECTIVE STATEMENT: Patient reports she is here today after being refer by one of her medical team for her newly diagnosed right breast cancer.   PERTINENT HISTORY:  Patient was diagnosed with right  breast cancer - plan is to have R lumpectomy - by Dr Ebbie 07/19/23  PATIENT GOALS:   reduce lymphedema risk and learn post op HEP.   PAIN:  Are you having pain? No  PRECAUTIONS: Active CA None      HAND DOMINANCE: right  WEIGHT BEARING RESTRICTIONS: No  FALLS:  Has patient fallen in last 6 months? 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  LEISURE: Likes to murphy oil - do not exercise-home making act   OBJECTIVE:  COGNITION: Overall cognitive status: Within functional limits for tasks  assessed    POSTURE:  Forward head and rounded shoulders posture - work on animator   UPPER EXTREMITY AROM/PROM:  A/PROM RIGHT   eval   Shoulder extension   Shoulder flexion 180  Shoulder abduction 180  Shoulder internal rotation WNL  Shoulder external rotation 90    (Blank rows = not tested)  A/PROM LEFT   eval  Shoulder extension   Shoulder flexion 180  Shoulder abduction 180  Shoulder internal rotation WNL  Shoulder external rotation 90    (Blank rows = not tested)  CERVICAL AROM: All within normal limits:     UPPER EXTREMITY STRENGTH: 5/5 inshoulders   LYMPHEDEMA ASSESSMENTS:   LANDMARK RIGHT   eval  10 cm proximal to olecranon process   Olecranon process   10 cm proximal to ulnar styloid process   Just proximal to ulnar styloid process   Across hand at thumb web space   At base of 2nd digit   (Blank rows = not tested)  LANDMARK LEFT   eval  10 cm proximal to olecranon process   Olecranon process   10 cm proximal to ulnar styloid process   Just proximal to ulnar styloid process   Across hand at thumb web space   At base of 2nd digit   (Blank rows = not tested)  L-DEX LYMPHEDEMA SCREENING:  The patient was assessed using the L-Dex machine today to produce a lymphedema index baseline score. The patient will be reassessed on a regular basis (typically every 3 months) to obtain new L-Dex scores. If the score is > 6.5 points away from his/her baseline score indicating onset of subclinical lymphedema, it will be recommended to wear a compression garment for 4 weeks, 12 hours per day and then be reassessed. If the score continues to be > 6.5 points from baseline at reassessment, we will initiate lymphedema treatment. Assessing in this manner has a 95% rate of preventing clinically significant lymphedema.   L-DEX FLOWSHEETS - 07/06/23 1700       L-DEX LYMPHEDEMA SCREENING   Measurement Type Unilateral    L-DEX MEASUREMENT EXTREMITY Upper Extremity     POSITION  Standing    DOMINANT SIDE Right    At Risk Side Right    BASELINE SCORE (UNILATERAL) 3.6              PATIENT EDUCATION:  Education details: Lymphedema risk reduction and post op shoulder/posture HEP Person educated: Patient Education method: Explanation, Demonstration, Handout Education comprehension: Patient verbalized understanding and returned demonstration  HOME EXERCISE PROGRAM: Patient was instructed today in a home exercise program today for post op shoulder range of motion. These included active assist shoulder flexion  and ext rotation in supine, scapular retraction in standing, wall walking/slides with shoulder abduction, - up to 90 degrees and can over head after surgeon okay.  She was encouraged to do these 2-3 x day, holding 3 seconds and repeating 10 times when permitted by her physician/surgeon.   ASSESSMENT:  CLINICAL IMPRESSION: Her multidisciplinary medical team has met to assess and determine a recommended treatment plan. She is planning to have R lumpectomy. She will benefit from a post op OT reassessment  of ROM in L UE , and to determine needs and from L-Dex screens every 3 months for 2 years to detect subclinical lymphedema.  Pt will benefit from skilled therapeutic intervention to improve on the following deficits: Decreased knowledge of precautions and lymphedema education, impaired UE functional use, pain, decreased ROM, postural dysfunction.   OT treatment/interventions: ADL/self-care home management, pt/family education, therapeutic exercise,manual therapy  REHAB POTENTIAL: Good  CLINICAL DECISION MAKING: Stable/uncomplicated  EVALUATION COMPLEXITY: Low   GOALS: Goals reviewed with patient? YES  LONG TERM GOALS: (STG=LTG)    Name Target Date Goal status  1 Pt will be able to verbalize understanding of pertinent lymphedema risk reduction practices relevant to her dx specifically related to skin care.  Baseline:  No knowledge 8 wks  Achieved at eval  2 Pt will be able to return demo and/or verbalize understanding of the post op HEP related to regaining shoulder ROM. Baseline:  No knowledge 07/06/23 Achieved at eval        4 Pt will demo she has regained full shoulder ROM and function post operatively compared to baselines.  Baseline: See objective measurements taken today. 8 wks Initial    PLAN:  OT FREQUENCY/DURATION: EVAL and 2 follow up appointment.   PLAN FOR NEXT SESSION: will reassess 2-4 weeks post op to determine needs.   Patient will follow up at outpatient cancer rehab 2-4 weeks following surgery.  If the patient requires occupational therapy at that time, a specific plan will be dictated and sent to the referring physician for approval. T Occupational Therapy Information for After Breast Cancer Surgery/Treatment:  Lymphedema is a swelling condition that you may be at risk for in your arm if you have lymph nodes removed from the armpit area.  After a sentinel node biopsy, the risk is approximately 5-9% and is higher after an axillary node dissection.  There is treatment available for this condition and it is not life-threatening.  Contact your physician or occupational therapist with concerns. You may begin the 4 shoulder/posture exercises (see additional sheet) when permitted by your physician (typically a week after surgery).  If you have drains, you may need to wait until those are removed before beginning range of motion exercises.  A general recommendation is to not lift your arms above shoulder height until drains are removed.  These exercises should be done to your tolerance and gently.  This is not a no pain/no gain type of recovery so listen to your body and stretch into the range of motion that you can tolerate, stopping if you have pain.  If you are having immediate reconstruction, ask your plastic surgeon about doing exercises as he or she may want you to wait. .  While undergoing any medical  procedure or treatment, try to avoid blood pressure being taken or needle sticks from occurring on the arm on the side of cancer.   This recommendation begins after surgery and continues for the rest of your life.  This may help reduce your  risk of getting lymphedema (swelling in your arm). An excellent resource for those seeking information on lymphedema is the National Lymphedema Network's web site. It can be accessed at www.lymphnet.org If you notice swelling in your hand, arm or breast at any time following surgery (even if it is many years from now), please contact your doctor or occupational therapist to discuss this.  Lymphedema can be treated at any time but it is easier for you if it is treated early on.  If you feel like your shoulder motion is not returning to normal in a reasonable amount of time, please contact your surgeon or occupational therapist.  [facility] [phone]. [address] [place], [place] [address]  Patient was instructed today in a home exercise program today for post op shoulder range of motion. These included active assist shoulder flexion in standing/supine, scapular retraction, wall walking/slides with shoulder abduction, and hands behind head external rotation in supine.  She was encouraged to do these 2-3 x day, holding 3 seconds and repeating 10 times when permitted by her physician/surgeon      Ancel Peters, OTR/L,CLT 07/06/2023, 5:58 PM

## 2023-07-07 ENCOUNTER — Encounter: Payer: Self-pay | Admitting: *Deleted

## 2023-07-08 ENCOUNTER — Encounter: Payer: Self-pay | Admitting: Genetic Counselor

## 2023-07-08 ENCOUNTER — Telehealth: Payer: Self-pay | Admitting: Genetic Counselor

## 2023-07-08 DIAGNOSIS — Z1379 Encounter for other screening for genetic and chromosomal anomalies: Secondary | ICD-10-CM | POA: Insufficient documentation

## 2023-07-08 NOTE — Telephone Encounter (Signed)
 Revealed negative genetic testing on the 13 gene BRCAPlus STAT panel.  The remainder of testing is pending.  We will call once that is complete.

## 2023-07-11 ENCOUNTER — Encounter (HOSPITAL_BASED_OUTPATIENT_CLINIC_OR_DEPARTMENT_OTHER): Payer: Self-pay | Admitting: General Surgery

## 2023-07-11 ENCOUNTER — Other Ambulatory Visit: Payer: Self-pay

## 2023-07-13 DIAGNOSIS — C50411 Malignant neoplasm of upper-outer quadrant of right female breast: Secondary | ICD-10-CM | POA: Diagnosis not present

## 2023-07-14 MED ORDER — ENSURE PRE-SURGERY PO LIQD
296.0000 mL | Freq: Once | ORAL | Status: DC
Start: 2023-07-15 — End: 2023-07-19

## 2023-07-14 MED ORDER — CHLORHEXIDINE GLUCONATE CLOTH 2 % EX PADS: 6.0000 | MEDICATED_PAD | Freq: Once | CUTANEOUS | Status: DC

## 2023-07-14 NOTE — Progress Notes (Signed)

## 2023-07-18 ENCOUNTER — Ambulatory Visit
Admission: RE | Admit: 2023-07-18 | Discharge: 2023-07-18 | Disposition: A | Discharge status: Home / Self Care | Payer: BC Managed Care – PPO | Source: Ambulatory Visit | Attending: General Surgery | Admitting: General Surgery

## 2023-07-18 DIAGNOSIS — C50412 Malignant neoplasm of upper-outer quadrant of left female breast: Secondary | ICD-10-CM

## 2023-07-18 DIAGNOSIS — D0511 Intraductal carcinoma in situ of right breast: Secondary | ICD-10-CM | POA: Diagnosis not present

## 2023-07-18 HISTORY — PX: BREAST BIOPSY: SHX20

## 2023-07-18 NOTE — Anesthesia Preprocedure Evaluation (Signed)
Anesthesia Evaluation  Patient identified by MRN, date of birth, ID band Patient awake    Reviewed: Allergy & Precautions, NPO status , Patient's Chart, lab work & pertinent test results  History of Anesthesia Complications (+) PONV and history of anesthetic complications  Airway Mallampati: II  TM Distance: >3 FB Neck ROM: Full    Dental  (+) Teeth Intact, Dental Advisory Given   Pulmonary    breath sounds clear to auscultation       Cardiovascular negative cardio ROS  Rhythm:Regular Rate:Normal     Neuro/Psych  Headaches  negative psych ROS   GI/Hepatic Neg liver ROS, hiatal hernia, PUD,GERD  Medicated,,  Endo/Other  negative endocrine ROS    Renal/GU negative Renal ROS     Musculoskeletal negative musculoskeletal ROS (+)    Abdominal   Peds  Hematology negative hematology ROS (+)   Anesthesia Other Findings   Reproductive/Obstetrics                             Anesthesia Physical Anesthesia Plan  ASA: 2  Anesthesia Plan: General   Post-op Pain Management: Regional block*   Induction: Intravenous  PONV Risk Score and Plan: 4 or greater and Ondansetron, Dexamethasone, Midazolam and Scopolamine patch - Pre-op  Airway Management Planned: LMA  Additional Equipment: None  Intra-op Plan:   Post-operative Plan: Extubation in OR  Informed Consent: I have reviewed the patients History and Physical, chart, labs and discussed the procedure including the risks, benefits and alternatives for the proposed anesthesia with the patient or authorized representative who has indicated his/her understanding and acceptance.     Dental advisory given  Plan Discussed with: CRNA  Anesthesia Plan Comments:        Anesthesia Quick Evaluation

## 2023-07-19 ENCOUNTER — Ambulatory Visit (HOSPITAL_BASED_OUTPATIENT_CLINIC_OR_DEPARTMENT_OTHER): Payer: Self-pay | Admitting: Anesthesiology

## 2023-07-19 ENCOUNTER — Other Ambulatory Visit: Payer: Self-pay

## 2023-07-19 ENCOUNTER — Encounter (HOSPITAL_BASED_OUTPATIENT_CLINIC_OR_DEPARTMENT_OTHER): Payer: Self-pay | Admitting: General Surgery

## 2023-07-19 ENCOUNTER — Encounter (HOSPITAL_BASED_OUTPATIENT_CLINIC_OR_DEPARTMENT_OTHER)
Admission: RE | Disposition: A | Discharge status: Home / Self Care | Payer: Self-pay | Source: Home / Self Care | Attending: General Surgery

## 2023-07-19 ENCOUNTER — Ambulatory Visit
Admission: RE | Admit: 2023-07-19 | Discharge: 2023-07-19 | Disposition: A | Discharge status: Home / Self Care | Payer: BC Managed Care – PPO | Source: Ambulatory Visit | Attending: General Surgery | Admitting: General Surgery

## 2023-07-19 ENCOUNTER — Ambulatory Visit (HOSPITAL_BASED_OUTPATIENT_CLINIC_OR_DEPARTMENT_OTHER)
Admission: RE | Admit: 2023-07-19 | Discharge: 2023-07-19 | Disposition: A | Discharge status: Home / Self Care | Payer: BC Managed Care – PPO | Attending: General Surgery | Admitting: General Surgery

## 2023-07-19 DIAGNOSIS — Z170 Estrogen receptor positive status [ER+]: Secondary | ICD-10-CM

## 2023-07-19 DIAGNOSIS — Z8719 Personal history of other diseases of the digestive system: Secondary | ICD-10-CM | POA: Diagnosis not present

## 2023-07-19 DIAGNOSIS — Z8711 Personal history of peptic ulcer disease: Secondary | ICD-10-CM | POA: Insufficient documentation

## 2023-07-19 DIAGNOSIS — K219 Gastro-esophageal reflux disease without esophagitis: Secondary | ICD-10-CM | POA: Insufficient documentation

## 2023-07-19 DIAGNOSIS — Z803 Family history of malignant neoplasm of breast: Secondary | ICD-10-CM | POA: Diagnosis not present

## 2023-07-19 DIAGNOSIS — Z800 Family history of malignant neoplasm of digestive organs: Secondary | ICD-10-CM | POA: Insufficient documentation

## 2023-07-19 DIAGNOSIS — C50911 Malignant neoplasm of unspecified site of right female breast: Secondary | ICD-10-CM | POA: Insufficient documentation

## 2023-07-19 DIAGNOSIS — N62 Hypertrophy of breast: Secondary | ICD-10-CM | POA: Diagnosis not present

## 2023-07-19 DIAGNOSIS — Z1721 Progesterone receptor positive status: Secondary | ICD-10-CM | POA: Insufficient documentation

## 2023-07-19 DIAGNOSIS — D0511 Intraductal carcinoma in situ of right breast: Secondary | ICD-10-CM | POA: Diagnosis not present

## 2023-07-19 DIAGNOSIS — C50411 Malignant neoplasm of upper-outer quadrant of right female breast: Secondary | ICD-10-CM | POA: Diagnosis not present

## 2023-07-19 DIAGNOSIS — G8918 Other acute postprocedural pain: Secondary | ICD-10-CM | POA: Diagnosis not present

## 2023-07-19 HISTORY — PX: BREAST LUMPECTOMY WITH RADIOACTIVE SEED AND SENTINEL LYMPH NODE BIOPSY: SHX6550

## 2023-07-19 HISTORY — PX: BREAST LUMPECTOMY: SHX2

## 2023-07-19 SURGERY — BREAST LUMPECTOMY WITH RADIOACTIVE SEED AND SENTINEL LYMPH NODE BIOPSY
Anesthesia: General | Site: Breast | Laterality: Right

## 2023-07-19 MED ORDER — BUPIVACAINE-EPINEPHRINE (PF) 0.5% -1:200000 IJ SOLN
INTRAMUSCULAR | Status: DC | PRN
  Administered 2023-07-19: 30 mL

## 2023-07-19 MED ORDER — OXYCODONE HCL 5 MG PO TABS: 5.0000 mg | ORAL_TABLET | Freq: Four times a day (QID) | ORAL | 0 refills | Status: AC | PRN

## 2023-07-19 MED ORDER — KETOROLAC TROMETHAMINE 15 MG/ML IJ SOLN
INTRAMUSCULAR | Status: AC
  Filled 2023-07-19: qty 1

## 2023-07-19 MED ORDER — ONDANSETRON HCL 4 MG/2ML IJ SOLN
INTRAMUSCULAR | Status: AC
  Filled 2023-07-19: qty 2

## 2023-07-19 MED ORDER — BUPIVACAINE HCL (PF) 0.25 % IJ SOLN
INTRAMUSCULAR | Status: AC
  Filled 2023-07-19: qty 30

## 2023-07-19 MED ORDER — ONDANSETRON HCL 4 MG/2ML IJ SOLN
INTRAMUSCULAR | Status: DC | PRN
  Administered 2023-07-19: 4 mg via INTRAVENOUS

## 2023-07-19 MED ORDER — PROPOFOL 10 MG/ML IV BOLUS
INTRAVENOUS | Status: AC
  Filled 2023-07-19: qty 20

## 2023-07-19 MED ORDER — SODIUM CHLORIDE 0.9 % IV SOLN: INTRAVENOUS | Status: DC | PRN

## 2023-07-19 MED ORDER — LIDOCAINE HCL (CARDIAC) PF 100 MG/5ML IV SOSY
PREFILLED_SYRINGE | INTRAVENOUS | Status: DC | PRN
  Administered 2023-07-19: 40 mg via INTRAVENOUS

## 2023-07-19 MED ORDER — DEXAMETHASONE SODIUM PHOSPHATE 10 MG/ML IJ SOLN
INTRAMUSCULAR | Status: AC
  Filled 2023-07-19: qty 2

## 2023-07-19 MED ORDER — MIDAZOLAM HCL 2 MG/2ML IJ SOLN
INTRAMUSCULAR | Status: AC
  Filled 2023-07-19: qty 2

## 2023-07-19 MED ORDER — FENTANYL CITRATE (PF) 100 MCG/2ML IJ SOLN: 25.0000 ug | INTRAMUSCULAR | Status: DC | PRN

## 2023-07-19 MED ORDER — ACETAMINOPHEN 160 MG/5ML PO SOLN: 325.0000 mg | ORAL | Status: DC | PRN

## 2023-07-19 MED ORDER — OXYCODONE HCL 5 MG PO TABS: 5.0000 mg | ORAL_TABLET | Freq: Once | ORAL | Status: DC | PRN

## 2023-07-19 MED ORDER — BUPIVACAINE HCL (PF) 0.25 % IJ SOLN
INTRAMUSCULAR | Status: DC | PRN
Start: 2023-07-19 — End: 2023-07-19
  Administered 2023-07-19: 5 mL

## 2023-07-19 MED ORDER — MAGTRACE LYMPHATIC TRACER
INTRAMUSCULAR | Status: DC | PRN
  Administered 2023-07-19: 2 mL via INTRAMUSCULAR

## 2023-07-19 MED ORDER — DEXAMETHASONE SODIUM PHOSPHATE 10 MG/ML IJ SOLN
INTRAMUSCULAR | Status: DC | PRN
  Administered 2023-07-19: 10 mg via INTRAVENOUS

## 2023-07-19 MED ORDER — ACETAMINOPHEN 10 MG/ML IV SOLN: 1000.0000 mg | Freq: Once | INTRAVENOUS | Status: DC | PRN

## 2023-07-19 MED ORDER — PROPOFOL 500 MG/50ML IV EMUL
INTRAVENOUS | Status: DC | PRN
Start: 2023-07-19 — End: 2023-07-19
  Administered 2023-07-19: 200 ug/kg/min via INTRAVENOUS
  Administered 2023-07-19: 150 ug/kg/min via INTRAVENOUS

## 2023-07-19 MED ORDER — ACETAMINOPHEN 325 MG PO TABS: 325.0000 mg | ORAL_TABLET | ORAL | Status: DC | PRN

## 2023-07-19 MED ORDER — KETOROLAC TROMETHAMINE 15 MG/ML IJ SOLN
15.0000 mg | INTRAMUSCULAR | Status: AC
  Administered 2023-07-19: 15 mg via INTRAVENOUS

## 2023-07-19 MED ORDER — DROPERIDOL 2.5 MG/ML IJ SOLN: 0.6250 mg | Freq: Once | INTRAMUSCULAR | Status: DC | PRN

## 2023-07-19 MED ORDER — FENTANYL CITRATE (PF) 100 MCG/2ML IJ SOLN
INTRAMUSCULAR | Status: AC
  Filled 2023-07-19: qty 2

## 2023-07-19 MED ORDER — SCOPOLAMINE 1 MG/3DAYS TD PT72
1.0000 | MEDICATED_PATCH | TRANSDERMAL | Status: DC
  Administered 2023-07-19: 1.5 mg via TRANSDERMAL

## 2023-07-19 MED ORDER — CEFAZOLIN SODIUM-DEXTROSE 2-4 GM/100ML-% IV SOLN
INTRAVENOUS | Status: AC
  Filled 2023-07-19: qty 100

## 2023-07-19 MED ORDER — CEFAZOLIN SODIUM-DEXTROSE 2-4 GM/100ML-% IV SOLN
2.0000 g | INTRAVENOUS | Status: AC
  Administered 2023-07-19: 2 g via INTRAVENOUS

## 2023-07-19 MED ORDER — LIDOCAINE 2% (20 MG/ML) 5 ML SYRINGE
INTRAMUSCULAR | Status: AC
  Filled 2023-07-19: qty 5

## 2023-07-19 MED ORDER — FENTANYL CITRATE (PF) 100 MCG/2ML IJ SOLN
100.0000 ug | Freq: Once | INTRAMUSCULAR | Status: AC
  Administered 2023-07-19: 50 ug via INTRAVENOUS

## 2023-07-19 MED ORDER — ACETAMINOPHEN 500 MG PO TABS: 1000.0000 mg | ORAL_TABLET | Freq: Once | ORAL | Status: DC

## 2023-07-19 MED ORDER — PROPOFOL 10 MG/ML IV BOLUS
INTRAVENOUS | Status: DC | PRN
  Administered 2023-07-19: 200 mg via INTRAVENOUS
  Administered 2023-07-19: 50 mg via INTRAVENOUS
  Administered 2023-07-19: 30 mg via INTRAVENOUS

## 2023-07-19 MED ORDER — ACETAMINOPHEN 500 MG PO TABS
ORAL_TABLET | ORAL | Status: AC
Start: 2023-07-19 — End: ?
  Filled 2023-07-19: qty 2

## 2023-07-19 MED ORDER — SCOPOLAMINE 1 MG/3DAYS TD PT72
MEDICATED_PATCH | TRANSDERMAL | Status: AC
  Filled 2023-07-19: qty 1

## 2023-07-19 MED ORDER — LACTATED RINGERS IV SOLN: INTRAVENOUS | Status: DC

## 2023-07-19 MED ORDER — OXYCODONE HCL 5 MG/5ML PO SOLN: 5.0000 mg | Freq: Once | ORAL | Status: DC | PRN

## 2023-07-19 MED ORDER — MIDAZOLAM HCL 2 MG/2ML IJ SOLN
2.0000 mg | Freq: Once | INTRAMUSCULAR | Status: AC
  Administered 2023-07-19: 2 mg via INTRAVENOUS

## 2023-07-19 MED ORDER — FENTANYL CITRATE (PF) 100 MCG/2ML IJ SOLN
INTRAMUSCULAR | Status: DC | PRN
  Administered 2023-07-19: 25 ug via INTRAVENOUS
  Administered 2023-07-19: 50 ug via INTRAVENOUS
  Administered 2023-07-19: 25 ug via INTRAVENOUS

## 2023-07-19 MED ORDER — ACETAMINOPHEN 500 MG PO TABS
1000.0000 mg | ORAL_TABLET | ORAL | Status: AC
  Administered 2023-07-19: 1000 mg via ORAL

## 2023-07-19 SURGICAL SUPPLY — 53 items
APPLIER CLIP 9.375 MED OPEN (MISCELLANEOUS)
BINDER BREAST LRG (GAUZE/BANDAGES/DRESSINGS) IMPLANT
BINDER BREAST MEDIUM (GAUZE/BANDAGES/DRESSINGS) IMPLANT
BINDER BREAST XLRG (GAUZE/BANDAGES/DRESSINGS) IMPLANT
BINDER BREAST XXLRG (GAUZE/BANDAGES/DRESSINGS) IMPLANT
BLADE SURG 15 STRL LF DISP TIS (BLADE) ×1 IMPLANT
CANISTER SUC SOCK COL 7IN (MISCELLANEOUS) IMPLANT
CANISTER SUCT 1200ML W/VALVE (MISCELLANEOUS) IMPLANT
CHLORAPREP W/TINT 26 (MISCELLANEOUS) ×1 IMPLANT
CLIP APPLIE 9.375 MED OPEN (MISCELLANEOUS) IMPLANT
CLIP TI WIDE RED SMALL 6 (CLIP) ×1 IMPLANT
COVER BACK TABLE 60X90IN (DRAPES) ×1 IMPLANT
COVER MAYO STAND STRL (DRAPES) ×1 IMPLANT
COVER PROBE CYLINDRICAL 5X96 (MISCELLANEOUS) ×1 IMPLANT
DERMABOND ADVANCED .7 DNX12 (GAUZE/BANDAGES/DRESSINGS) ×1 IMPLANT
DRAPE LAPAROSCOPIC ABDOMINAL (DRAPES) ×1 IMPLANT
DRAPE UTILITY XL STRL (DRAPES) ×1 IMPLANT
ELECT COATED BLADE 2.86 ST (ELECTRODE) ×1 IMPLANT
ELECT REM PT RETURN 9FT ADLT (ELECTROSURGICAL) ×1
ELECTRODE REM PT RTRN 9FT ADLT (ELECTROSURGICAL) ×1 IMPLANT
GLOVE BIO SURGEON STRL SZ7 (GLOVE) ×2 IMPLANT
GLOVE BIOGEL PI IND STRL 7.0 (GLOVE) IMPLANT
GLOVE BIOGEL PI IND STRL 7.5 (GLOVE) ×1 IMPLANT
GLOVE SURG SYN 7.5 E (GLOVE) ×1
GLOVE SURG SYN 7.5 PF PI (GLOVE) IMPLANT
GOWN STRL REUS W/ TWL LRG LVL3 (GOWN DISPOSABLE) ×2 IMPLANT
GOWN STRL REUS W/ TWL XL LVL3 (GOWN DISPOSABLE) IMPLANT
HEMOSTAT ARISTA ABSORB 3G PWDR (HEMOSTASIS) IMPLANT
KIT MARKER MARGIN INK (KITS) ×1 IMPLANT
NDL HYPO 25X1 1.5 SAFETY (NEEDLE) ×1 IMPLANT
NDL SAFETY ECLIPSE 18X1.5 (NEEDLE) IMPLANT
NEEDLE HYPO 25X1 1.5 SAFETY (NEEDLE) ×1
NS IRRIG 1000ML POUR BTL (IV SOLUTION) IMPLANT
PACK BASIN DAY SURGERY FS (CUSTOM PROCEDURE TRAY) ×1 IMPLANT
PENCIL SMOKE EVACUATOR (MISCELLANEOUS) ×1 IMPLANT
RETRACTOR ONETRAX LX 90X20 (MISCELLANEOUS) IMPLANT
SLEEVE SCD COMPRESS KNEE MED (STOCKING) ×1 IMPLANT
SPIKE FLUID TRANSFER (MISCELLANEOUS) IMPLANT
SPONGE T-LAP 4X18 ~~LOC~~+RFID (SPONGE) ×1 IMPLANT
STRIP CLOSURE SKIN 1/2X4 (GAUZE/BANDAGES/DRESSINGS) ×1 IMPLANT
SUT ETHILON 2 0 FS 18 (SUTURE) IMPLANT
SUT MNCRL AB 4-0 PS2 18 (SUTURE) ×1 IMPLANT
SUT MON AB 5-0 PS2 18 (SUTURE) IMPLANT
SUT SILK 2 0 SH (SUTURE) IMPLANT
SUT VIC AB 2-0 SH 27XBRD (SUTURE) ×1 IMPLANT
SUT VIC AB 3-0 SH 27X BRD (SUTURE) ×1 IMPLANT
SUT VIC AB 5-0 PS2 18 (SUTURE) IMPLANT
SYR CONTROL 10ML LL (SYRINGE) ×1 IMPLANT
TOWEL GREEN STERILE FF (TOWEL DISPOSABLE) ×1 IMPLANT
TRACER MAGTRACE VIAL (MISCELLANEOUS) IMPLANT
TRAY FAXITRON CT DISP (TRAY / TRAY PROCEDURE) ×1 IMPLANT
TUBE CONNECTING 20X1/4 (TUBING) IMPLANT
YANKAUER SUCT BULB TIP NO VENT (SUCTIONS) IMPLANT

## 2023-07-19 NOTE — Progress Notes (Signed)
Assisted Dr. Hollis with right, pectoralis, ultrasound guided block. Side rails up, monitors on throughout procedure. See vital signs in flow sheet. Tolerated Procedure well. 

## 2023-07-19 NOTE — Op Note (Signed)
Preoperative diagnosis: Clinical stage I right  breast cancer Postoperative diagnosis: Same as above Procedure: 1.  RIght breast radioactive seed guided lumpectomy 2.  Right deep axillary sentinel lymph node biopsy 3.  Injection of magtrace for sentinel node identification Surgeon: Dr. Harden Mo Anesthesia: General With a pectoral block Estimated blood loss: 75 cc Specimens: 1. Right breast anterior lumpectomy specimen marked with paint 2. Right breast posterior lumpectomy specimen with clip marked with paint, seed separate Complications: None Drains: None Sponge and count was correct completion Disposition recovery stable addition   Indications: 52 yof with [place] mm that shows 1.7 cm mass in ruoq and small left breast mass. The left ends up being cysts. 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 We discussed lumpectomy and sn biopsy.    Procedure: After informed consent was obtained she first underwent a pectoral block.  I had her images available for review.   She was given antibiotics.  SCDs were in place.  She was placed under general anesthesia without complication.  She was prepped and draped in a standard sterile surgical fashion.   I injected 1 cc of magtrace subareolar. Due to her prior reduction I also injected 1 cc peritumoral as well. I massaged this for 5 minutes.    I located the seed in the lateral breast. I elected to make a curvilinear incision overlying the seed.  I then dissected to the seed. She had a lot of scar tissue related to reduction making this difficult.  Eventually as I was going posterior I entered a small hematoma cavity and the seed floated out. I removed the anterior portion and the clip was not present. I then was able to identify the clip and tumor and removed this.  Mammogram confirmed removal and appeared margins were clear.  Hemostasis was then obtained.  I placed a couple clips in the cavity.  I closed down this cavity  with several layers of 2-0 Vicryl.  The skin was closed with 3-0 Vicryl and the skin with 4-0 Monocryl.  Glue and Steri-Strips were eventually applied.     I then made an incision below the axillary hairline and carried this through the axillary fascia.  I was able to identify a tiny node with counts over 5000.  There appeared to be a couple other nodes right there that I removed.    There was no background activity.  There are no more palpable nodes present.  I then obtained hemostasis.  I did place some Arista in this cavity.  I closed it down with 2-0 Vicryl, 3-0 Vicryl, and 4 Monocryl.  Glue and Steri-Strips were applied.  She tolerated this well was extubated and transferred to recovery.

## 2023-07-19 NOTE — Anesthesia Postprocedure Evaluation (Signed)
Anesthesia Post Note  Patient: Location manager  Procedure(s) Performed: RIGHT BREAST SEED GUIDED LUMPECTOMY AND RIGHT AXILLARY SENTINEL NODE BIOPSY (Right: Breast)     Patient location during evaluation: PACU Anesthesia Type: General Level of consciousness: awake and alert Pain management: pain level controlled Vital Signs Assessment: post-procedure vital signs reviewed and stable Respiratory status: spontaneous breathing, nonlabored ventilation, respiratory function stable and patient connected to nasal cannula oxygen Cardiovascular status: blood pressure returned to baseline and stable Postop Assessment: no apparent nausea or vomiting Anesthetic complications: no  No notable events documented.  Last Vitals:  Vitals:   07/19/23 0930 07/19/23 0945  BP: 107/68 (!) 127/92  Pulse: 60 68  Resp: 12 16  Temp:  (!) 36.2 C  SpO2: 97% 95%    Last Pain:  Vitals:   07/19/23 0945  TempSrc:   PainSc: 0-No pain                 Shelton Silvas

## 2023-07-19 NOTE — Anesthesia Procedure Notes (Signed)
Anesthesia Regional Block: Pectoralis block   Pre-Anesthetic Checklist: , timeout performed,  Correct Patient, Correct Site, Correct Laterality,  Correct Procedure, Correct Position, site marked,  Risks and benefits discussed,  Surgical consent,  Pre-op evaluation,  At surgeon's request and post-op pain management  Laterality: Right  Prep: chloraprep       Needles:  Injection technique: Single-shot  Needle Type: Echogenic Stimulator Needle     Needle Length: 9cm  Needle Gauge: 21     Additional Needles:   Procedures:,,,, ultrasound used (permanent image in chart),,    Narrative:  Start time: 07/19/2023 7:10 AM End time: 07/19/2023 7:15 AM Injection made incrementally with aspirations every 5 mL.  Performed by: Personally  Anesthesiologist: Shelton Silvas, MD  Additional Notes: Discussed risks and benefits of the nerve block in detail, including but not limited vascular injury, permanent nerve damage and infection.   Patient tolerated the procedure well. Local anesthetic introduced in an incremental fashion under minimal resistance after negative aspirations. No paresthesias were elicited. After completion of the procedure, no acute issues were identified and patient continued to be monitored by RN.

## 2023-07-19 NOTE — Transfer of Care (Signed)
Immediate Anesthesia Transfer of Care Note  Patient: Janet Lynn  Procedure(s) Performed: RIGHT BREAST SEED GUIDED LUMPECTOMY AND RIGHT AXILLARY SENTINEL NODE BIOPSY (Right: Breast)  Patient Location: PACU  Anesthesia Type:General  Level of Consciousness: drowsy and patient cooperative  Airway & Oxygen Therapy: Patient Spontanous Breathing and Patient connected to face mask oxygen  Post-op Assessment: Report given to RN and Post -op Vital signs reviewed and stable  Post vital signs: Reviewed and stable  Last Vitals:  Vitals Value Taken Time  BP 119/72 07/19/23 0854  Temp    Pulse 75 07/19/23 0855  Resp 13 07/19/23 0855  SpO2 100 % 07/19/23 0855  Vitals shown include unfiled device data.  Last Pain:  Vitals:   07/19/23 0640  TempSrc: Oral  PainSc: 2       Patients Stated Pain Goal: 4 (07/19/23 0640)  Complications: No notable events documented.

## 2023-07-19 NOTE — H&P (Signed)
Janet Lynn is an 53 y.o. female.   Chief Complaint: breast cancer HPI: 43 yof with [place] mm that shows 1.7 cm mass in ruoq and small left breast mass. The left ends up being cysts. 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  She is here to discuss options  Past Medical History:  Diagnosis Date   Allergy    Family history of breast cancer    GERD (gastroesophageal reflux disease)    History of hiatal hernia    Migraine with aura    PONV (postoperative nausea and vomiting)    Vertigo     Past Surgical History:  Procedure Laterality Date   APPENDECTOMY  05/31/1985   BREAST BIOPSY Right 06/21/2023   [place] RT BREAST BX W LOC DEV 1ST LESION IMG BX SPEC US GUIDE 06/21/2023 [facility]   BREAST BIOPSY  07/18/2023   MM RT RADIOACTIVE SEED LOC MAMMO GUIDE 07/18/2023 [facility]   BREAST REDUCTION SURGERY     CHOLECYSTECTOMY     COLONOSCOPY WITH PROPOFOL N/A 05/12/2021   Procedure: COLONOSCOPY WITH PROPOFOL;  Surgeon: Toney Reil, MD;  Location: [facility] ENDOSCOPY;  Service: Gastroenterology;  Laterality: N/A;   ESOPHAGOGASTRODUODENOSCOPY (EGD) WITH PROPOFOL N/A 05/12/2021   Procedure: ESOPHAGOGASTRODUODENOSCOPY (EGD) WITH PROPOFOL;  Surgeon: Toney Reil, MD;  Location: [facility] ENDOSCOPY;  Service: Gastroenterology;  Laterality: N/A;   ESOPHAGOGASTRODUODENOSCOPY (EGD) WITH PROPOFOL N/A 02/23/2022   Procedure: ESOPHAGOGASTRODUODENOSCOPY (EGD) WITH BIOPSIES;  Surgeon: Toney Reil, MD;  Location: [facility];  Service: Endoscopy;  Laterality: N/A;   ESOPHAGOGASTRODUODENOSCOPY (EGD) WITH PROPOFOL N/A 05/28/2022   Procedure: ESOPHAGOGASTRODUODENOSCOPY (EGD) WITH PROPOFOL;  Surgeon: Toney Reil, MD;  Location: [facility] END[facility]COPY;  Service: Gastroenterology;  Laterality: N/A;   THYROID SURGERY  06/01/1987   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 60s   Breast cancer Paternal Aunt        dx. 60s   Cirrhosis Paternal Uncle    Heart disease Maternal Grandmother    Heart attack Maternal Grandfather    Liver cancer Paternal Grandmother 2   Heart disease Paternal Grandfather    Breast cancer Other        PGM's sister   Breast cancer Cousin        father's maternal cousin   Breast cancer Cousin 44       paternal first cousin   Colon cancer Neg Hx    Social History:  reports that she has never smoked. She has never used smokeless tobacco. She reports that she does not drink alcohol and does not use drugs.  Allergies:  Allergies  Allergen Reactions   Erythromycin Rash    Medications Prior to Admission  Medication Sig Dispense Refill   acetaminophen (TYLENOL) 500 MG tablet Take 2 tablets (1,000 mg total) by mouth every 6 (six) hours as needed for mild pain.     cyclobenzaprine (FLEXERIL) 5 MG tablet Take 1 tablet (5 mg total) by mouth 2 (two) times daily as needed (for migraines.  sedation caution.). 30 tablet 1   omeprazole (PRILOSEC) 40 MG capsule Take 1 capsule (40 mg total) by mouth daily. 90 capsule 3  meclizine (ANTIVERT) 25 MG tablet Take 1/2 to one tablet prn vertigo 20 tablet 0    No results found for this or any previous visit (from the past 48 hours). MM RT RADIOACTIVE SEED LOC MAMMO GUIDE Result Date: 07/18/2023 CLINICAL DATA:  53 year old female presenting for seed localization of the right breast. Patient has newly diagnosed right breast invasive mammary carcinoma. EXAM: MAMMOGRAPHIC GUIDED RADIOACTIVE SEED LOCALIZATION OF THE RIGHT BREAST COMPARISON:  Previous exam(s). FINDINGS: Patient presents for radioactive seed localization prior to right breast lumpectomy. I met with the patient and we discussed the procedure of seed localization including benefits and alternatives. We discussed the high likelihood of a  successful procedure. We discussed the risks of the procedure including infection, bleeding, tissue injury and further surgery. We discussed the low dose of radioactivity involved in the procedure. Informed, written consent was given. The usual time-out protocol was performed immediately prior to the procedure. Using mammographic guidance, sterile technique, 1% lidocaine and an I-125 radioactive seed, the coil biopsy marking clip in the right breast was localized using a lateral approach. The follow-up mammogram images confirm the seed in the expected location and were marked for Dr. Dwain Sarna. Follow-up survey of the patient confirms presence of the radioactive seed. Order number of I-125 seed:  [id number]. Total activity: 0.259 McI reference Date: 06/09/2023 The patient tolerated the procedure well and was [facility]. She was given instructions regarding seed removal. IMPRESSION: Radioactive seed localization right breast. No apparent complications. Electronically Signed   By: Emmaline Kluver M.D.   On: 07/18/2023 15:52    Review of Systems  All other systems reviewed and are negative.   Blood pressure 134/89, pulse 85, temperature 98.6 F (37 C), temperature source Oral, resp. rate 14, height 5\' 1"  (1.549 m), weight 93.8 kg, last menstrual period 10/29/2020, SpO2 99%. Physical Exam Vitals reviewed.  Constitutional:      Appearance: Normal appearance.  Chest:  Breasts:    Right: No inverted nipple, mass or nipple discharge.     Left: No inverted nipple, mass or nipple discharge.  Lymphadenopathy:     Upper Body:     Right upper body: No supraclavicular or axillary adenopathy.     Left upper body: No supraclavicular or axillary adenopathy.  Neurological:     Mental Status: She is alert.      Assessment/Plan Right lump/sn   Emelia Loron, MD 07/19/2023, 7:04 AM

## 2023-07-19 NOTE — Anesthesia Procedure Notes (Signed)
Procedure Name: LMA Insertion Date/Time: 07/19/2023 7:41 AM  Performed by: Lauralyn Primes, CRNAPre-anesthesia Checklist: Patient identified, Emergency Drugs available, Suction available and Patient being monitored Patient Re-evaluated:Patient Re-evaluated prior to induction Oxygen Delivery Method: Circle system utilized Preoxygenation: Pre-oxygenation with 100% oxygen Induction Type: IV induction Ventilation: Mask ventilation without difficulty LMA: LMA inserted LMA Size: 4.0 Number of attempts: 1 Airway Equipment and Method: Bite block Placement Confirmation: positive ETCO2 Tube secured with: Tape Dental Injury: Teeth and Oropharynx as per pre-operative assessment

## 2023-07-19 NOTE — Discharge Instructions (Addendum)
[facility] Office Phone Number [phone]  POST OP INSTRUCTIONS Take 400 mg of ibuprofen every 8 hours or 650 mg tylenol every 6 hours for next 72 hours then as needed. Use ice several times daily also.  A prescription for pain medication may be given to you upon discharge.  Take your pain medication as prescribed, if needed.  If narcotic pain medicine is not needed, then you may take acetaminophen (Tylenol), naprosyn (Alleve) or ibuprofen (Advil) as needed. Take your usually prescribed medications unless otherwise directed If you need a refill on your pain medication, please contact your pharmacy.  They will contact our office to request authorization.  Prescriptions will not be filled after 5pm or on week-ends. You should eat very light the first 24 hours after surgery, such as soup, crackers, pudding, etc.  Resume your normal diet the day after surgery. Most patients will experience some swelling and bruising in the breast.  Ice packs and a good support bra will help.  Wear the breast binder provided or a sports bra for 72 hours day and night.  After that wear a sports bra during the day until you return to the office. Swelling and bruising can take several days to resolve.  It is common to experience some constipation if taking pain medication after surgery.  Increasing fluid intake and taking a stool softener will usually help or prevent this problem from occurring.  A mild laxative (Milk of Magnesia or Miralax) should be taken according to package directions if there are no bowel movements after 48 hours. I used skin glue on the incision, you may shower in 24 hours.  The glue will flake off over the next 2-3 weeks.  Any sutures or staples will be removed at the office during your follow-up visit. ACTIVITIES:  You may resume regular daily activities (gradually increasing) beginning the next day.  Wearing a good support bra or sports bra minimizes pain and swelling.  You may have  sexual intercourse when it is comfortable. You may drive when you no longer are taking prescription pain medication, you can comfortably wear a seatbelt, and you can safely maneuver your car and apply brakes. RETURN TO WORK:  ______________________________________________________________________________________ Bonita Quin should see your doctor in the office for a follow-up appointment approximately two weeks after your surgery.  Your doctor's nurse will typically make your follow-up appointment when she calls you with your pathology report.  Expect your pathology report 3-4 business days after your surgery.  You may call to check if you do not hear from [place] after three days. OTHER INSTRUCTIONS: _______________________________________________________________________________________________ _____________________________________________________________________________________________________________________________________ _____________________________________________________________________________________________________________________________________ _____________________________________________________________________________________________________________________________________  WHEN TO CALL DR WAKEFIELD: Fever over 101.0 Nausea and/or vomiting. Extreme swelling or bruising. Continued bleeding from incision. Increased pain, redness, or drainage from the incision.  The clinic staff is available to answer your questions during regular business hours.  Please don't hesitate to call and ask to speak to one of the nurses for clinical concerns.  If you have a medical emergency, go to the nearest emergency room or call 911.  A surgeon from [facility] is always on call at the hospital.  For further questions, please visit [email or web address]  Post Anesthesia Home Care Instructions  Activity: Get plenty of rest for the remainder of the day. A responsible individual must stay with  you for 24 hours following the procedure.  For the next 24 hours, DO NOT: -Drive a car -Advertising copywriter -Drink alcoholic beverages -Take any medication unless instructed by your  physician -Make any legal decisions or sign important papers.  Meals: Start with liquid foods such as gelatin or soup. Progress to regular foods as tolerated. Avoid greasy, spicy, heavy foods. If nausea and/or vomiting occur, drink only clear liquids until the nausea and/or vomiting subsides. Call your physician if vomiting continues.  Special Instructions/Symptoms: Your throat may feel dry or sore from the anesthesia or the breathing tube placed in your throat during surgery. If this causes discomfort, gargle with warm salt water. The discomfort should disappear within 24 hours.  If you had a scopolamine patch placed behind your ear for the management of post- operative nausea and/or vomiting:  1. The medication in the patch is effective for 72 hours, after which it should be removed.  Wrap patch in a tissue and discard in the trash. Wash hands thoroughly with soap and water. 2. You may remove the patch earlier than 72 hours if you experience unpleasant side effects which may include dry mouth, dizziness or visual disturbances. 3. Avoid touching the patch. Wash your hands with soap and water after contact with the patch.      Can have next dose of Tylenol at 11pm Can have next dose of Ibuprofen at 1pm

## 2023-07-19 NOTE — H&P (View-Only) (Signed)
 Janet Lynn is an 53 y.o. female.   Chief Complaint: breast cancer HPI: 43 yof with [place] mm that shows 1.7 cm mass in ruoq and small left breast mass. The left ends up being cysts. 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  She is here to discuss options  Past Medical History:  Diagnosis Date   Allergy    Family history of breast cancer    GERD (gastroesophageal reflux disease)    History of hiatal hernia    Migraine with aura    PONV (postoperative nausea and vomiting)    Vertigo     Past Surgical History:  Procedure Laterality Date   APPENDECTOMY  05/31/1985   BREAST BIOPSY Right 06/21/2023   [place] RT BREAST BX W LOC DEV 1ST LESION IMG BX SPEC US GUIDE 06/21/2023 [facility]   BREAST BIOPSY  07/18/2023   MM RT RADIOACTIVE SEED LOC MAMMO GUIDE 07/18/2023 [facility]   BREAST REDUCTION SURGERY     CHOLECYSTECTOMY     COLONOSCOPY WITH PROPOFOL N/A 05/12/2021   Procedure: COLONOSCOPY WITH PROPOFOL;  Surgeon: Toney Reil, MD;  Location: [facility] ENDOSCOPY;  Service: Gastroenterology;  Laterality: N/A;   ESOPHAGOGASTRODUODENOSCOPY (EGD) WITH PROPOFOL N/A 05/12/2021   Procedure: ESOPHAGOGASTRODUODENOSCOPY (EGD) WITH PROPOFOL;  Surgeon: Toney Reil, MD;  Location: [facility] ENDOSCOPY;  Service: Gastroenterology;  Laterality: N/A;   ESOPHAGOGASTRODUODENOSCOPY (EGD) WITH PROPOFOL N/A 02/23/2022   Procedure: ESOPHAGOGASTRODUODENOSCOPY (EGD) WITH BIOPSIES;  Surgeon: Toney Reil, MD;  Location: [facility];  Service: Endoscopy;  Laterality: N/A;   ESOPHAGOGASTRODUODENOSCOPY (EGD) WITH PROPOFOL N/A 05/28/2022   Procedure: ESOPHAGOGASTRODUODENOSCOPY (EGD) WITH PROPOFOL;  Surgeon: Toney Reil, MD;  Location: [facility] ENDOSCOPY;  Service: Gastroenterology;  Laterality: N/A;   THYROID SURGERY  06/01/1987   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 60s   Breast cancer Paternal Aunt        dx. 60s   Cirrhosis Paternal Uncle    Heart disease Maternal Grandmother    Heart attack Maternal Grandfather    Liver cancer Paternal Grandmother 2   Heart disease Paternal Grandfather    Breast cancer Other        PGM's sister   Breast cancer Cousin        father's maternal cousin   Breast cancer Cousin 44       paternal first cousin   Colon cancer Neg Hx    Social History:  reports that she has never smoked. She has never used smokeless tobacco. She reports that she does not drink alcohol and does not use drugs.  Allergies:  Allergies  Allergen Reactions   Erythromycin Rash    Medications Prior to Admission  Medication Sig Dispense Refill   acetaminophen (TYLENOL) 500 MG tablet Take 2 tablets (1,000 mg total) by mouth every 6 (six) hours as needed for mild pain.     cyclobenzaprine (FLEXERIL) 5 MG tablet Take 1 tablet (5 mg total) by mouth 2 (two) times daily as needed (for migraines.  sedation caution.). 30 tablet 1   omeprazole (PRILOSEC) 40 MG capsule Take 1 capsule (40 mg total) by mouth daily. 90 capsule 3  meclizine (ANTIVERT) 25 MG tablet Take 1/2 to one tablet prn vertigo 20 tablet 0    No results found for this or any previous visit (from the past 48 hours). MM RT RADIOACTIVE SEED LOC MAMMO GUIDE Result Date: 07/18/2023 CLINICAL DATA:  53 year old female presenting for seed localization of the right breast. Patient has newly diagnosed right breast invasive mammary carcinoma. EXAM: MAMMOGRAPHIC GUIDED RADIOACTIVE SEED LOCALIZATION OF THE RIGHT BREAST COMPARISON:  Previous exam(s). FINDINGS: Patient presents for radioactive seed localization prior to right breast lumpectomy. I met with the patient and we discussed the procedure of seed localization including benefits and alternatives. We discussed the high likelihood of a  successful procedure. We discussed the risks of the procedure including infection, bleeding, tissue injury and further surgery. We discussed the low dose of radioactivity involved in the procedure. Informed, written consent was given. The usual time-out protocol was performed immediately prior to the procedure. Using mammographic guidance, sterile technique, 1% lidocaine and an I-125 radioactive seed, the coil biopsy marking clip in the right breast was localized using a lateral approach. The follow-up mammogram images confirm the seed in the expected location and were marked for Dr. Dwain Sarna. Follow-up survey of the patient confirms presence of the radioactive seed. Order number of I-125 seed:  [id number]. Total activity: 0.259 McI reference Date: 06/09/2023 The patient tolerated the procedure well and was [facility]. She was given instructions regarding seed removal. IMPRESSION: Radioactive seed localization right breast. No apparent complications. Electronically Signed   By: Emmaline Kluver M.D.   On: 07/18/2023 15:52    Review of Systems  All other systems reviewed and are negative.   Blood pressure 134/89, pulse 85, temperature 98.6 F (37 C), temperature source Oral, resp. rate 14, height 5\' 1"  (1.549 m), weight 93.8 kg, last menstrual period 10/29/2020, SpO2 99%. Physical Exam Vitals reviewed.  Constitutional:      Appearance: Normal appearance.  Chest:  Breasts:    Right: No inverted nipple, mass or nipple discharge.     Left: No inverted nipple, mass or nipple discharge.  Lymphadenopathy:     Upper Body:     Right upper body: No supraclavicular or axillary adenopathy.     Left upper body: No supraclavicular or axillary adenopathy.  Neurological:     Mental Status: She is alert.      Assessment/Plan Right lump/sn   Emelia Loron, MD 07/19/2023, 7:04 AM

## 2023-07-19 NOTE — Interval H&P Note (Signed)
History and Physical Interval Note:  07/19/2023 7:06 AM  Janet Lynn  has presented today for surgery, with the diagnosis of RIGHT BREAST CANCER.  The various methods of treatment have been discussed with the patient and family. After consideration of risks, benefits and other options for treatment, the patient has consented to  Procedure(s) with comments: RIGHT BREAST SEED GUIDED LUMPECTOMY AND RIGHT AXILLARY SENTINEL NODE BIOPSY (Right) - LMA PEC BLOCK as a surgical intervention.  The patient's history has been reviewed, patient examined, no change in status, stable for surgery.  I have reviewed the patient's chart and labs.  Questions were answered to the patient's satisfaction.     Emelia Loron

## 2023-07-20 ENCOUNTER — Encounter (HOSPITAL_BASED_OUTPATIENT_CLINIC_OR_DEPARTMENT_OTHER): Payer: Self-pay | Admitting: General Surgery

## 2023-07-21 ENCOUNTER — Ambulatory Visit: Payer: Self-pay | Admitting: Genetic Counselor

## 2023-07-21 ENCOUNTER — Telehealth: Payer: Self-pay | Admitting: Genetic Counselor

## 2023-07-21 DIAGNOSIS — Z1379 Encounter for other screening for genetic and chromosomal anomalies: Secondary | ICD-10-CM

## 2023-07-21 NOTE — Telephone Encounter (Signed)
Revealed negative genetic testing.  Discussed that we do not know why she has breast cancer or why there is cancer in the family. It could be due to a different gene that we are not testing, or maybe our current technology may not be able to pick something up.  It will be important for her to keep in contact with genetics to keep up with whether additional testing may be needed.   FH VUS was identified.  This will not change medical management.

## 2023-07-21 NOTE — Progress Notes (Signed)
HPI:  Ms. Volland was previously seen in the [facility] due to a personal and family history of breast cancer and concerns regarding a hereditary predisposition to cancer. Please refer to our prior cancer genetics clinic note for more information regarding our discussion, assessment and recommendations, at the time. Ms. Stillion recent genetic test results were disclosed to her, as were recommendations warranted by these results. These results and recommendations are discussed in more detail below.  CANCER HISTORY:  Oncology History  Breast cancer (HCC)  05/13/2023 Mammogram   Screening mammogram from 05/13/2023 Showed abnormal mass in the right breast.  Diagnostic mammogram bilateral (06/16/2023) FINDINGS: Additional tomograms were performed of the bilateral breast. There is a spiculated mass in the outer right breast measuring 1.7 cm. There is an oval circumscribed mass in the outer left breast measuring 0.7 cm.   Physical examination of the outer right breast reveals a firm masslike area of thickening at the approximate 8:30 to 9 o'clock position.   Targeted ultrasound of the right breast was performed. There is an irregular hypoechoic mass in the right breast at 9 o'clock 6 cm from nipple measuring 2 x 1.2 x 1.5 cm. This corresponds well with the mass seen in the right breast at mammography. No abnormal lymph nodes identified in the right axilla.   Targeted ultrasound of the left breast was performed. There is a cluster of microcysts in the left breast at 3 o'clock 3 cm from nipple measuring 0.8 x 0.4 x 0.8 cm. This corresponds well with the mass seen in the outer left breast at mammography.   IMPRESSION: Suspicious 2 cm mass in the right breast at the 9 o'clock position.     06/21/2023 Pathology Results   FINAL DIAGNOSIS        1. Breast, right, needle core biopsy, 9:00 6cmfn coil clip :       -  INVASIVE MAMMARY CARCINOMA   The biopsy material  shows an infiltrative proliferation of cells with large vesicular nuclei with inconspicuous nucleoli, arranged linearly and in small clusters. Based on the biopsy, the carcinoma appears Nottingham grade 2 of 3 and measures 1.0 cm in greatest linear extent.   Results:  IMMUNOHISTOCHEMICAL AND MORPHOMETRIC ANALYSIS PERFORMED MANUALLY  The tumor cells are negative for Her2 (1+).  Estrogen Receptor:  95%, POSITIVE, STRONG STAINING INTENSITY  Progesterone Receptor:  100%, POSITIVE, STRONG STAINING INTENSITY  Proliferation Marker Ki67:  5%    06/27/2023 Cancer Staging   Staging form: Breast, AJCC 8th Edition - Clinical: Stage IB (cT2, cN0, cM0, G2, ER+, PR+, HER2-) - Signed by Michaelyn Barter, MD on 06/27/2023 Stage prefix: Initial diagnosis Histologic grading system: 3 grade system   07/07/2023 Genetic Testing   Negative genetic testing on the BRCAPlus and CancerNext-Expanded+RNA panel  FH p.F225S (c.674T>C) VUS identified.  The report date is July 07, 2023.  The CancerNext-Expanded gene panel offered by [facility] and includes sequencing, rearrangement, and RNA analysis for the following 76 genes: AIP, ALK, APC, ATM, AXIN2, BAP1, BARD1, BMPR1A, BRCA1, BRCA2, BRIP1, CDC73, CDH1, CDK4, CDKN1B, CDKN2A, CEBPA, CHEK2, CTNNA1, DDX41, DICER1, ETV6, FH, FLCN, GATA2, LZTR1, MAX, MBD4, MEN1, MET, MLH1, MSH2, MSH3, MSH6, MUTYH, NF1, NF2, NTHL1, PALB2, PHOX2B, PMS2, POT1, PRKAR1A, PTCH1, PTEN, RAD51C, RAD51D, RB1, RET, RUNX1, SDHA, SDHAF2, SDHB, SDHC, SDHD, SMAD4, SMARCA4, SMARCB1, SMARCE1, STK11, SUFU, TMEM127, TP53, TSC1, TSC2, VHL, and WT1 (sequencing and deletion/duplication); EGFR, HOXB13, KIT, MITF, PDGFRA, POLD1, and POLE (sequencing only); EPCAM and GREM1 (deletion/duplication only).  The BRCAPlus gene panel offered by [facility] and includes sequencing and rearrangement analysis for the following 13 genes: ATM, BARD1, BRCA1, BRCA2, CDH1, CHEK2, NF1, PALB2, PTEN, RAD51C, RAD51D, STK11 and TP53.       FAMILY HISTORY:  We obtained a detailed, 4-generation family history.  Significant diagnoses are listed below: Family History  Problem Relation Age of Onset   Hypertension Mother    Diabetes Mother    Breast cancer Paternal Aunt        dx. 60s   Breast cancer Paternal Aunt        dx. 60s   Cirrhosis Paternal Uncle    Heart disease Maternal Grandmother    Heart attack Maternal Grandfather    Liver cancer Paternal Grandmother 58   Heart disease Paternal Grandfather    Breast cancer Other        PGM's sister   Breast cancer Cousin        father's maternal cousin   Breast cancer Cousin 51       paternal first cousin   Colon cancer Neg Hx        The patient has two sons who are cancer free.  She has a maternal half brother who is cancer free.  Both parents are living.   The patient's mother does not have cancer.  She has one sister who is cancer free.  There is no reported family history of cancer on the maternal side.   The patient's father is living.  He has two brothers and two sisters.  Both sister had breast cancer in their 45's and one has a daughter with breast cancer at 42.  This cousin reportedly had negative genetic testing.  We do not have a report to view. The paternal grandparents are deceased.  The grandmother reportedly had liver cancer at 95.  She had a sister who had breast cancer and that sister had a daughter with breast cancer.   Ms. Lafferty is aware of previous family history of genetic testing for hereditary cancer risks. There is no reported Ashkenazi Jewish ancestry. There is no known consanguinity.  GENETIC TEST RESULTS: Genetic testing reported out on July 08, 2023 through the CancerNext-Expanded+RNAinsight cancer panel found no pathogenic mutations. The CancerNext-Expanded gene panel offered by [facility] and includes sequencing, rearrangement, and RNA analysis for the following 76 genes: AIP, ALK, APC, ATM, AXIN2, BAP1, BARD1, BMPR1A, BRCA1,  BRCA2, BRIP1, CDC73, CDH1, CDK4, CDKN1B, CDKN2A, CEBPA, CHEK2, CTNNA1, DDX41, DICER1, ETV6, FH, FLCN, GATA2, LZTR1, MAX, MBD4, MEN1, MET, MLH1, MSH2, MSH3, MSH6, MUTYH, NF1, NF2, NTHL1, PALB2, PHOX2B, PMS2, POT1, PRKAR1A, PTCH1, PTEN, RAD51C, RAD51D, RB1, RET, RUNX1, SDHA, SDHAF2, SDHB, SDHC, SDHD, SMAD4, SMARCA4, SMARCB1, SMARCE1, STK11, SUFU, TMEM127, TP53, TSC1, TSC2, VHL, and WT1 (sequencing and deletion/duplication); EGFR, HOXB13, KIT, MITF, PDGFRA, POLD1, and POLE (sequencing only); EPCAM and GREM1 (deletion/duplication only). The test report has been scanned into EPIC and is located under the Molecular Pathology section of the Results Review tab.  A portion of the result report is included below for reference.     We discussed with Ms. Middlekauff that because current genetic testing is not perfect, it is possible there may be a gene mutation in one of these genes that current testing cannot detect, but that chance is small.  We also discussed, that there could be another gene that has not yet been discovered, or that we have not yet tested, that is responsible for the cancer diagnoses in the family. It is also  possible there is a hereditary cause for the cancer in the family that Ms. Vanwyk did not inherit and therefore was not identified in her testing.  Therefore, it is important to remain in touch with cancer genetics in the future so that we can continue to offer Ms. Mcleroy the most up to date genetic testing.   Genetic testing did identify a variant of uncertain significance (VUS) was identified in the FH gene called p.F225S (c.674T>C).  At this time, it is unknown if this variant is associated with increased cancer risk or if this is a normal finding, but most variants such as this get reclassified to being inconsequential. It should not be used to make medical management decisions. With time, we suspect the lab will determine the significance of this variant, if any. If we do learn more about it,  we will try to contact Ms. Nayak to discuss it further. However, it is important to stay in touch with [place] periodically and keep the address and phone number up to date.  ADDITIONAL GENETIC TESTING: We discussed with Ms. Byer that her genetic testing was fairly extensive.  If there are genes identified to increase cancer risk that can be analyzed in the future, we would be happy to discuss and coordinate this testing at that time.    CANCER SCREENING RECOMMENDATIONS: Ms. Foley test result is considered negative (normal).  This means that we have not identified a hereditary cause for her personal and family history of breast cancer at this time. Most cancers happen by chance and this negative test suggests that her personal and family history of breast cancer may fall into this category.    Possible reasons for Ms. Ratcliffe's negative genetic test include:  1. There may be a gene mutation in one of these genes that current testing methods cannot detect but that chance is small.  2. There could be another gene that has not yet been discovered, or that we have not yet tested, that is responsible for the cancer diagnoses in the family.  3.  There may be no hereditary risk for cancer in the family. The cancers in Ms. Gusman and/or her family may be sporadic/familial or due to other genetic and environmental factors. 4. It is also possible there is a hereditary cause for the cancer in the family that Ms. Hulett did not inherit.  Therefore, it is recommended she continue to follow the cancer management and screening guidelines provided by her oncology and primary healthcare provider. An individual's cancer risk and medical management are not determined by genetic test results alone. Overall cancer risk assessment incorporates additional factors, including personal medical history, family history, and any available genetic information that may result in a personalized plan for cancer prevention and  surveillance  RECOMMENDATIONS FOR FAMILY MEMBERS:   Since she did not inherit a identifiable mutation in a cancer predisposition gene included on this panel, her children could not have inherited a known mutation from her in one of these genes. Individuals in this family might be at some increased risk of developing cancer, over the general population risk, simply due to the family history of cancer.  We recommended women in this family have a yearly mammogram beginning at age 26, or 12 years younger than the earliest onset of cancer, an annual clinical breast exam, and perform monthly breast self-exams. Women in this family should also have a gynecological exam as recommended by their primary provider. All family members should be referred for colonoscopy starting  at age 45, or 69 years younger than the earliest onset of cancer. It is also possible there is a hereditary cause for the cancer in Ms. Hascall's family that she did not inherit and therefore was not identified in her.  Based on Ms. Diosdado's family history, we recommended her paternal family members have genetic counseling and testing. Ms. Sykora will let us know if we can be of any assistance in coordinating genetic counseling and/or testing for this family member.   FOLLOW-UP: Lastly, we discussed with Ms. Thone that cancer genetics is a rapidly advancing field and it is possible that new genetic tests will be appropriate for her and/or her family members in the future. We encouraged her to remain in contact with cancer genetics on an annual basis so we can update her personal and family histories and let her know of advances in cancer genetics that may benefit this family.   Our contact number was provided. Ms. Glahn questions were answered to her satisfaction, and she knows she is welcome to call us at anytime with additional questions or concerns.   Maylon Cos, MS, [facility] Licensed, Certified Genetic  Counselor [email or web address]

## 2023-07-22 LAB — SURGICAL PATHOLOGY

## 2023-07-25 ENCOUNTER — Encounter: Payer: Self-pay | Admitting: *Deleted

## 2023-07-25 NOTE — Progress Notes (Signed)
 Oncotype Dx order submitted online

## 2023-07-27 ENCOUNTER — Other Ambulatory Visit: Payer: Self-pay | Admitting: General Surgery

## 2023-07-29 ENCOUNTER — Encounter: Payer: Self-pay | Admitting: *Deleted

## 2023-07-29 NOTE — Progress Notes (Signed)
 Ms. Hinde is going to have a re-excision on 3/17.  Appt. With Dr. Alena Bills and Dr. Rushie Chestnut r/s'd to 3/24.  Appt. Details given to her.

## 2023-08-01 DIAGNOSIS — Z170 Estrogen receptor positive status [ER+]: Secondary | ICD-10-CM | POA: Diagnosis not present

## 2023-08-01 DIAGNOSIS — C50911 Malignant neoplasm of unspecified site of right female breast: Secondary | ICD-10-CM | POA: Diagnosis not present

## 2023-08-02 ENCOUNTER — Encounter: Payer: Self-pay | Admitting: *Deleted

## 2023-08-02 ENCOUNTER — Encounter (HOSPITAL_COMMUNITY): Payer: Self-pay

## 2023-08-02 ENCOUNTER — Encounter: Payer: Self-pay | Admitting: Internal Medicine

## 2023-08-02 NOTE — Progress Notes (Signed)
 Oncotype score came back low at 13.  Janet Lynn notified that chemotherapy will not be needed.   Re-excision is planned for 3/17

## 2023-08-03 ENCOUNTER — Ambulatory Visit: Payer: BC Managed Care – PPO | Attending: Internal Medicine | Admitting: Occupational Therapy

## 2023-08-03 DIAGNOSIS — M25611 Stiffness of right shoulder, not elsewhere classified: Secondary | ICD-10-CM | POA: Insufficient documentation

## 2023-08-03 DIAGNOSIS — L905 Scar conditions and fibrosis of skin: Secondary | ICD-10-CM | POA: Diagnosis not present

## 2023-08-03 NOTE — Therapy (Signed)
 OUTPATIENT OCCUPATIONAL THERAPY BREAST CANCER BASELINE TREATMENT   Patient Name: Janet Lynn MRN: [id number] DOB:06/16/1970, 53 y.o., female Today's Date: 08/03/2023  END OF SESSION:  OT End of Session - 08/03/23 1854     Visit Number 2    Number of Visits 4    Date for OT Re-Evaluation 08/31/23    OT Start Time 0901    OT Stop Time 0925    OT Time Calculation (min) 24 min    Activity Tolerance Patient tolerated treatment well    Behavior During Therapy WFL for tasks assessed/performed             Past Medical History:  Diagnosis Date   Allergy    Family history of breast cancer    GERD (gastroesophageal reflux disease)    History of hiatal hernia    Migraine with aura    PONV (postoperative nausea and vomiting)    Vertigo    Past Surgical History:  Procedure Laterality Date   APPENDECTOMY  05/31/1985   BREAST BIOPSY Right 06/21/2023   [place] RT BREAST BX W LOC DEV 1ST LESION IMG BX SPEC US GUIDE 06/21/2023 [facility]   BREAST BIOPSY  07/18/2023   MM RT RADIOACTIVE SEED LOC MAMMO GUIDE 07/18/2023 [facility]   BREAST LUMPECTOMY WITH RADIOACTIVE SEED AND SENTINEL LYMPH NODE BIOPSY Right 07/19/2023   Procedure: RIGHT BREAST SEED GUIDED LUMPECTOMY AND RIGHT AXILLARY SENTINEL NODE BIOPSY;  Surgeon: Emelia Loron, MD;  Location: [facility];  Service: General;  Laterality: Right;  LMA PEC BLOCK   BREAST REDUCTION SURGERY     CHOLECYSTECTOMY     COLONOSCOPY WITH PROPOFOL N/A 05/12/2021   Procedure: COLONOSCOPY WITH PROPOFOL;  Surgeon: Toney Reil, MD;  Location: [facility];  Service: Gastroenterology;  Laterality: N/A;   ESOPHAGOGASTRODUODENOSCOPY (EGD) WITH PROPOFOL N/A 05/12/2021   Procedure: ESOPHAGOGASTRODUODENOSCOPY (EGD) WITH PROPOFOL;  Surgeon: Toney Reil, MD;  Location: [facility];  Service: Gastroenterology;  Laterality: N/A;   ESOPHAGOGASTRODUODENOSCOPY (EGD) WITH PROPOFOL N/A 02/23/2022   Procedure:  ESOPHAGOGASTRODUODENOSCOPY (EGD) WITH BIOPSIES;  Surgeon: Toney Reil, MD;  Location: [facility];  Service: Endoscopy;  Laterality: N/A;   ESOPHAGOGASTRODUODENOSCOPY (EGD) WITH PROPOFOL N/A 05/28/2022   Procedure: ESOPHAGOGASTRODUODENOSCOPY (EGD) WITH PROPOFOL;  Surgeon: Toney Reil, MD;  Location: [facility];  Service: Gastroenterology;  Laterality: N/A;   THYROID SURGERY  06/01/1987   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   TONSILLECTOMY     TUBAL LIGATION     bilateral   Patient Active Problem List   Diagnosis Date Noted   Genetic testing 07/08/2023   Family history of breast cancer    Breast cancer (HCC) 06/27/2023   Elevated platelet count 06/03/2023   Abnormal CBC 02/02/2023   Ulcer of esophagus without bleeding    Hiatal hernia with GERD without esophagitis    Advance care planning 01/27/2022   Dysphagia 01/27/2022   Epistaxis 01/27/2022   Migraine with aura 01/27/2022   Allergic rhinitis due to allergen 01/08/2022   Benign paroxysmal positional vertigo, right 01/08/2022   Erosive esophagitis    Routine general medical examination at a health care facility    Obesity with body mass index greater than 30 04/06/2021    PCP: Dr Para March  REFERRING PROVIDER: Dr Alena Bills  REFERRING DIAG: R breast Cancer  THERAPY DIAG:  Scar tissue  Stiffness of right shoulder, not elsewhere classified  Rationale for Evaluation and Treatment: Rehabilitation  ONSET DATE: 07/19/23  SUBJECTIVE:  SUBJECTIVE STATEMENT: I had lumpectomy on 07/19/2023.  But I cannot raise my arm up over my head.  I feel like a pull in my armpit into my arm. PERTINENT HISTORY:  Patient was diagnosed with right  breast cancer -patient had R lumpectomy - by Dr Dwain Sarna 07/19/23-patient will not have chemo but  possible radiation.  She is having 08/15/2023 every excision.  PATIENT GOALS:   reduce lymphedema risk and learn post op HEP.   PAIN:  Are you having pain?  7/10 in right axilla into upper arm and elbow with shoulder range of motion  PRECAUTIONS: Active CA       HAND DOMINANCE: right  WEIGHT BEARING RESTRICTIONS: No  FALLS:  Has patient fallen in last 6 months? No  LIVING ENVIRONMENT: Patient lives with: Live with husband  OCCUPATION: Work [employer] on [employer]  LEISURE: Likes to [employer] - do not exercise-home making act   OBJECTIVE:  COGNITION: Overall cognitive status: Within functional limits for tasks assessed    POSTURE:  Forward head and rounded shoulders posture - work on [employer]   UPPER EXTREMITY AROM/PROM: Patient arrive with increased pain and decreased motion in right shoulder flexion and abduction external rotation.  Upon assessment appear patient has some lymphatic cording from incision and axilla into the upper arm to elbow. Manual therapy done by OT with traction able to release 5 of lymphatic cording with great progress in range of motion and relief in pain. Reviewed with patient again home exercises for shoulder flexion and abduction in supine using wand keeping pain under 3/10.  15 reps 3 times a day As well as external rotation with hand behind the head.  10 reps using gravity to assist With patient and shoulder external rotation or abduction reviewed with patient light manual traction with hip rotation to the left to assist with facilitation of cording release patient can do at home.  A/PROM RIGHT   eval  R 08/03/23  Shoulder extension    Shoulder flexion 180 130 end of session 145  Shoulder abduction 180 90 end of session 130   Shoulder internal rotation WNL   Shoulder external rotation 90 80    (Blank rows = not tested)  A/PROM LEFT   eval  Shoulder extension   Shoulder flexion 180  Shoulder abduction 180  Shoulder internal rotation WNL   Shoulder external rotation 90    (Blank rows = not tested)     CERVICAL AROM: All within normal limits:     UPPER EXTREMITY STRENGTH: 5/5 inshoulders   LYMPHEDEMA ASSESSMENTS:   LANDMARK RIGHT   eval  10 cm proximal to olecranon process   Olecranon process   10 cm proximal to ulnar styloid process   Just proximal to ulnar styloid process   Across hand at thumb web space   At base of 2nd digit   (Blank rows = not tested)  LANDMARK LEFT   eval  10 cm proximal to olecranon process   Olecranon process   10 cm proximal to ulnar styloid process   Just proximal to ulnar styloid process   Across hand at thumb web space   At base of 2nd digit   (Blank rows = not tested)  L-DEX LYMPHEDEMA SCREENING:  The patient was assessed using the L-Dex machine today to produce a lymphedema index baseline score. The patient will be reassessed on a regular basis (typically every 3 months) to obtain new L-Dex scores. If the score is > 6.5 points away from his/her  baseline score indicating onset of subclinical lymphedema, it will be recommended to wear a compression garment for 4 weeks, 12 hours per day and then be reassessed. If the score continues to be > 6.5 points from baseline at reassessment, we will initiate lymphedema treatment. Assessing in this manner has a 95% rate of preventing clinically significant lymphedema.   L-DEX FLOWSHEETS - 08/03/23 1800       L-DEX LYMPHEDEMA SCREENING   Measurement Type Unilateral    L-DEX MEASUREMENT EXTREMITY Upper Extremity    POSITION  Standing    DOMINANT SIDE Right    At Risk Side Right    BASELINE SCORE (UNILATERAL) 3.6    L-DEX SCORE (UNILATERAL) 1.3    VALUE CHANGE (UNILAT) -2.3              PATIENT EDUCATION:  Education details: Lymphedema risk reduction and post op shoulder/posture HEP Person educated: Patient Education method: Explanation, Demonstration, Handout Education comprehension: Patient verbalized understanding and  returned demonstration     ASSESSMENT:  CLINICAL IMPRESSION: Patient follow-up with OT postop.  She had R lumpectomy on 07/19/2023 by Dr. Dwain Sarna.  Appear patient going to have a reexcision on 08/15/2023 at this time we will not have chemo but a possibility for radiation.  Patient presented OT today with increased scar adhesion with lymphatic cording into the axilla upper arm to volar elbow limiting her right shoulder range of motion with increased pain 7/10.  OT was able to release some of the lymphatic cording with great progress in range of motion as well as pain.  Patient was educated again in a home program for range of motion as well as scar adhesions and lymphatic cording.  Patient will benefit from continued OT services to decrease scar tissue , decrease pain and increase motion and strength in right upper extremity as well as decreased lymphedema risk and from L-Dex screens  to detect subclinical lymphedema.  Pt will benefit from skilled therapeutic intervention to improve on the following deficits: Decreased knowledge of precautions and lymphedema education, impaired UE functional use, pain, decreased ROM, postural dysfunction.   OT treatment/interventions: ADL/self-care home management, pt/family education, therapeutic exercise,manual therapy  REHAB POTENTIAL: Good  CLINICAL DECISION MAKING: Stable/uncomplicated  EVALUATION COMPLEXITY: Low   GOALS: Goals reviewed with patient? YES  LONG TERM GOALS: (STG=LTG)    Name Target Date Goal status  1 Pt will be able to verbalize understanding of pertinent lymphedema risk reduction practices relevant to her dx specifically related to skin care.  Baseline:  No knowledge 08/31/23 Progressing  2 Pt will be able to return demo and/or verbalize understanding of the post op HEP related to regaining shoulder ROM. Baseline:  No knowledge 08/31/2023 Progressing        4 Pt will demo she has regained full shoulder ROM and function post  operatively compared to baselines.  Baseline: See objective measurements taken today. 08/31/23 Progressing    PLAN:  OT FREQUENCY/DURATION: 2-3  follow up appointment.   PLAN FOR NEXT SESSION: 4 wks   Patient will follow up at outpatient cancer rehab 2-4 weeks following surgery.  If the patient requires occupational therapy at that time, a specific plan will be dictated and sent to the referring physician for approval. T Occupational Therapy Information for After Breast Cancer Surgery/Treatment:  Lymphedema is a swelling condition that you may be at risk for in your arm if you have lymph nodes removed from the armpit area.  After a sentinel node biopsy, the risk is  approximately 5-9% and is higher after an axillary node dissection.  There is treatment available for this condition and it is not life-threatening.  Contact your physician or occupational therapist with concerns. You may begin the 4 shoulder/posture exercises (see additional sheet) when permitted by your physician (typically a week after surgery).  If you have drains, you may need to wait until those are removed before beginning range of motion exercises.  A general recommendation is to not lift your arms above shoulder height until drains are removed.  These exercises should be done to your tolerance and gently.  This is not a "no pain/no gain" type of recovery so listen to your body and stretch into the range of motion that you can tolerate, stopping if you have pain.  If you are having immediate reconstruction, ask your plastic surgeon about doing exercises as he or she may want you to wait. .  While undergoing any medical procedure or treatment, try to avoid blood pressure being taken or needle sticks from occurring on the arm on the side of cancer.   This recommendation begins after surgery and continues for the rest of your life.  This may help reduce your risk of getting lymphedema (swelling in your arm). An excellent resource for  those seeking information on lymphedema is the National Lymphedema Network's web site. It can be accessed at www.lymphnet.org If you notice swelling in your hand, arm or breast at any time following surgery (even if it is many years from now), please contact your doctor or occupational therapist to discuss this.  Lymphedema can be treated at any time but it is easier for you if it is treated early on.  If you feel like your shoulder motion is not returning to normal in a reasonable amount of time, please contact your surgeon or occupational therapist.  [facility] [phone]. [address] [place], [place] [address]      Oletta Cohn, OTR/L,CLT 08/03/2023, 6:56 PM

## 2023-08-07 ENCOUNTER — Other Ambulatory Visit: Payer: Self-pay | Admitting: General Surgery

## 2023-08-08 ENCOUNTER — Encounter (HOSPITAL_BASED_OUTPATIENT_CLINIC_OR_DEPARTMENT_OTHER): Payer: Self-pay | Admitting: General Surgery

## 2023-08-09 ENCOUNTER — Ambulatory Visit: Payer: BC Managed Care – PPO | Admitting: Internal Medicine

## 2023-08-09 ENCOUNTER — Institutional Professional Consult (permissible substitution): Payer: BC Managed Care – PPO | Admitting: Radiation Oncology

## 2023-08-15 ENCOUNTER — Encounter (HOSPITAL_BASED_OUTPATIENT_CLINIC_OR_DEPARTMENT_OTHER)
Admission: RE | Disposition: A | Discharge status: Home / Self Care | Payer: Self-pay | Source: Home / Self Care | Attending: General Surgery

## 2023-08-15 ENCOUNTER — Ambulatory Visit (HOSPITAL_BASED_OUTPATIENT_CLINIC_OR_DEPARTMENT_OTHER): Payer: Self-pay | Admitting: Certified Registered"

## 2023-08-15 ENCOUNTER — Other Ambulatory Visit: Payer: Self-pay

## 2023-08-15 ENCOUNTER — Encounter (HOSPITAL_BASED_OUTPATIENT_CLINIC_OR_DEPARTMENT_OTHER): Payer: Self-pay | Admitting: General Surgery

## 2023-08-15 ENCOUNTER — Ambulatory Visit (HOSPITAL_BASED_OUTPATIENT_CLINIC_OR_DEPARTMENT_OTHER)
Admission: RE | Admit: 2023-08-15 | Discharge: 2023-08-15 | Disposition: A | Discharge status: Home / Self Care | Payer: BC Managed Care – PPO | Attending: General Surgery | Admitting: General Surgery

## 2023-08-15 DIAGNOSIS — C50411 Malignant neoplasm of upper-outer quadrant of right female breast: Secondary | ICD-10-CM | POA: Diagnosis not present

## 2023-08-15 DIAGNOSIS — Z170 Estrogen receptor positive status [ER+]: Secondary | ICD-10-CM | POA: Diagnosis not present

## 2023-08-15 DIAGNOSIS — K219 Gastro-esophageal reflux disease without esophagitis: Secondary | ICD-10-CM | POA: Insufficient documentation

## 2023-08-15 DIAGNOSIS — N641 Fat necrosis of breast: Secondary | ICD-10-CM | POA: Insufficient documentation

## 2023-08-15 DIAGNOSIS — Z803 Family history of malignant neoplasm of breast: Secondary | ICD-10-CM | POA: Insufficient documentation

## 2023-08-15 DIAGNOSIS — Z1721 Progesterone receptor positive status: Secondary | ICD-10-CM | POA: Diagnosis not present

## 2023-08-15 DIAGNOSIS — C50911 Malignant neoplasm of unspecified site of right female breast: Secondary | ICD-10-CM | POA: Diagnosis not present

## 2023-08-15 DIAGNOSIS — Z01818 Encounter for other preprocedural examination: Secondary | ICD-10-CM

## 2023-08-15 HISTORY — PX: RE-EXCISION OF BREAST LUMPECTOMY: SHX6048

## 2023-08-15 SURGERY — EXCISION, LESION, BREAST
Anesthesia: General | Site: Breast | Laterality: Right

## 2023-08-15 MED ORDER — ENSURE PRE-SURGERY PO LIQD: 296.0000 mL | Freq: Once | ORAL | Status: DC

## 2023-08-15 MED ORDER — LIDOCAINE HCL (CARDIAC) PF 100 MG/5ML IV SOSY
PREFILLED_SYRINGE | INTRAVENOUS | Status: DC | PRN
  Administered 2023-08-15: 100 mg via INTRAVENOUS

## 2023-08-15 MED ORDER — CHLORHEXIDINE GLUCONATE CLOTH 2 % EX PADS: 6.0000 | MEDICATED_PAD | Freq: Once | CUTANEOUS | Status: DC

## 2023-08-15 MED ORDER — ACETAMINOPHEN 500 MG PO TABS
1000.0000 mg | ORAL_TABLET | ORAL | Status: AC
Start: 2023-08-15 — End: 2023-08-15
  Administered 2023-08-15: 1000 mg via ORAL

## 2023-08-15 MED ORDER — LACTATED RINGERS IV SOLN: INTRAVENOUS | Status: DC

## 2023-08-15 MED ORDER — FENTANYL CITRATE (PF) 100 MCG/2ML IJ SOLN
25.0000 ug | INTRAMUSCULAR | Status: DC | PRN
Start: 2023-08-15 — End: 2023-08-15
  Administered 2023-08-15 (×2): 50 ug via INTRAVENOUS

## 2023-08-15 MED ORDER — ACETAMINOPHEN 500 MG PO TABS
ORAL_TABLET | ORAL | Status: AC
  Filled 2023-08-15: qty 2

## 2023-08-15 MED ORDER — OXYCODONE HCL 5 MG/5ML PO SOLN: 5.0000 mg | Freq: Once | ORAL | Status: AC | PRN

## 2023-08-15 MED ORDER — DEXAMETHASONE SODIUM PHOSPHATE 4 MG/ML IJ SOLN
INTRAMUSCULAR | Status: DC | PRN
  Administered 2023-08-15: 10 mg via INTRAVENOUS

## 2023-08-15 MED ORDER — BUPIVACAINE HCL (PF) 0.25 % IJ SOLN
INTRAMUSCULAR | Status: DC | PRN
  Administered 2023-08-15: 17 mL

## 2023-08-15 MED ORDER — DEXMEDETOMIDINE HCL IN NACL 80 MCG/20ML IV SOLN
INTRAVENOUS | Status: DC | PRN
  Administered 2023-08-15: 4 ug via INTRAVENOUS

## 2023-08-15 MED ORDER — SCOPOLAMINE 1 MG/3DAYS TD PT72
MEDICATED_PATCH | TRANSDERMAL | Status: AC
Start: 2023-08-15 — End: ?
  Filled 2023-08-15: qty 1

## 2023-08-15 MED ORDER — CEFAZOLIN SODIUM-DEXTROSE 2-4 GM/100ML-% IV SOLN
INTRAVENOUS | Status: AC
  Filled 2023-08-15: qty 100

## 2023-08-15 MED ORDER — PROPOFOL 10 MG/ML IV BOLUS
INTRAVENOUS | Status: DC | PRN
  Administered 2023-08-15: 170 mg via INTRAVENOUS

## 2023-08-15 MED ORDER — OXYCODONE HCL 5 MG PO TABS
5.0000 mg | ORAL_TABLET | Freq: Once | ORAL | Status: AC | PRN
  Administered 2023-08-15: 5 mg via ORAL

## 2023-08-15 MED ORDER — ONDANSETRON HCL 4 MG/2ML IJ SOLN
INTRAMUSCULAR | Status: DC | PRN
  Administered 2023-08-15: 4 mg via INTRAVENOUS

## 2023-08-15 MED ORDER — DROPERIDOL 2.5 MG/ML IJ SOLN: 0.6250 mg | Freq: Once | INTRAMUSCULAR | Status: DC | PRN

## 2023-08-15 MED ORDER — PROPOFOL 500 MG/50ML IV EMUL
INTRAVENOUS | Status: DC | PRN
  Administered 2023-08-15: 150 ug/kg/min via INTRAVENOUS

## 2023-08-15 MED ORDER — MIDAZOLAM HCL 2 MG/2ML IJ SOLN
INTRAMUSCULAR | Status: AC
  Filled 2023-08-15: qty 2

## 2023-08-15 MED ORDER — OXYCODONE HCL 5 MG PO TABS
ORAL_TABLET | ORAL | Status: AC
  Filled 2023-08-15: qty 1

## 2023-08-15 MED ORDER — FENTANYL CITRATE (PF) 100 MCG/2ML IJ SOLN
INTRAMUSCULAR | Status: AC
  Filled 2023-08-15: qty 2

## 2023-08-15 MED ORDER — FENTANYL CITRATE (PF) 100 MCG/2ML IJ SOLN
INTRAMUSCULAR | Status: DC | PRN
  Administered 2023-08-15: 50 ug via INTRAVENOUS
  Administered 2023-08-15 (×2): 25 ug via INTRAVENOUS

## 2023-08-15 MED ORDER — SCOPOLAMINE 1 MG/3DAYS TD PT72
1.0000 | MEDICATED_PATCH | Freq: Once | TRANSDERMAL | Status: DC
  Administered 2023-08-15: 1.5 mg via TRANSDERMAL

## 2023-08-15 MED ORDER — MIDAZOLAM HCL 2 MG/2ML IJ SOLN
INTRAMUSCULAR | Status: DC | PRN
  Administered 2023-08-15: 2 mg via INTRAVENOUS

## 2023-08-15 MED ORDER — CEFAZOLIN SODIUM-DEXTROSE 2-4 GM/100ML-% IV SOLN
2.0000 g | INTRAVENOUS | Status: AC
  Administered 2023-08-15: 2 g via INTRAVENOUS

## 2023-08-15 SURGICAL SUPPLY — 45 items
BINDER BREAST LRG (GAUZE/BANDAGES/DRESSINGS) IMPLANT
BINDER BREAST MEDIUM (GAUZE/BANDAGES/DRESSINGS) IMPLANT
BINDER BREAST XLRG (GAUZE/BANDAGES/DRESSINGS) IMPLANT
BINDER BREAST XXLRG (GAUZE/BANDAGES/DRESSINGS) IMPLANT
BLADE SURG 15 STRL LF DISP TIS (BLADE) ×1 IMPLANT
CANISTER SUCT 1200ML W/VALVE (MISCELLANEOUS) ×1 IMPLANT
CHLORAPREP W/TINT 26 (MISCELLANEOUS) ×1 IMPLANT
CLIP TI WIDE RED SMALL 6 (CLIP) IMPLANT
COVER BACK TABLE 60X90IN (DRAPES) ×1 IMPLANT
COVER MAYO STAND STRL (DRAPES) ×1 IMPLANT
DERMABOND ADVANCED .7 DNX12 (GAUZE/BANDAGES/DRESSINGS) IMPLANT
DRAPE LAPAROSCOPIC ABDOMINAL (DRAPES) ×1 IMPLANT
DRAPE UTILITY XL STRL (DRAPES) ×1 IMPLANT
DRSG TEGADERM 4X4.75 (GAUZE/BANDAGES/DRESSINGS) ×1 IMPLANT
ELECT COATED BLADE 2.86 ST (ELECTRODE) ×1 IMPLANT
ELECT REM PT RETURN 9FT ADLT (ELECTROSURGICAL) ×1 IMPLANT
ELECTRODE REM PT RTRN 9FT ADLT (ELECTROSURGICAL) ×1 IMPLANT
GAUZE SPONGE 4X4 12PLY STRL LF (GAUZE/BANDAGES/DRESSINGS) ×1 IMPLANT
GLOVE BIO SURGEON STRL SZ7 (GLOVE) ×1 IMPLANT
GLOVE BIOGEL PI IND STRL 7.5 (GLOVE) ×1 IMPLANT
GOWN STRL REUS W/ TWL LRG LVL3 (GOWN DISPOSABLE) ×3 IMPLANT
HEMOSTAT ARISTA ABSORB 3G PWDR (HEMOSTASIS) IMPLANT
KIT MARKER MARGIN INK (KITS) IMPLANT
NDL HYPO 25X1 1.5 SAFETY (NEEDLE) ×1 IMPLANT
NEEDLE HYPO 25X1 1.5 SAFETY (NEEDLE) ×1 IMPLANT
NS IRRIG 1000ML POUR BTL (IV SOLUTION) IMPLANT
PACK BASIN DAY SURGERY FS (CUSTOM PROCEDURE TRAY) ×1 IMPLANT
PENCIL SMOKE EVACUATOR (MISCELLANEOUS) ×1 IMPLANT
RETRACTOR ONETRAX LX 90X20 (MISCELLANEOUS) IMPLANT
SLEEVE SCD COMPRESS KNEE MED (STOCKING) ×1 IMPLANT
SPIKE FLUID TRANSFER (MISCELLANEOUS) IMPLANT
SPONGE T-LAP 4X18 ~~LOC~~+RFID (SPONGE) ×1 IMPLANT
STRIP CLOSURE SKIN 1/2X4 (GAUZE/BANDAGES/DRESSINGS) IMPLANT
SUT MNCRL AB 3-0 PS2 18 (SUTURE) IMPLANT
SUT MNCRL AB 4-0 PS2 18 (SUTURE) IMPLANT
SUT MON AB 5-0 PS2 18 (SUTURE) IMPLANT
SUT SILK 2 0 SH (SUTURE) IMPLANT
SUT VIC AB 2-0 SH 27XBRD (SUTURE) ×1 IMPLANT
SUT VIC AB 3-0 SH 27X BRD (SUTURE) ×1 IMPLANT
SUT VIC AB 5-0 PS2 18 (SUTURE) IMPLANT
SUT VICRYL AB 3 0 TIES (SUTURE) IMPLANT
SYR CONTROL 10ML LL (SYRINGE) ×1 IMPLANT
TOWEL GREEN STERILE FF (TOWEL DISPOSABLE) ×1 IMPLANT
TUBE CONNECTING 20X1/4 (TUBING) ×1 IMPLANT
YANKAUER SUCT BULB TIP NO VENT (SUCTIONS) ×1 IMPLANT

## 2023-08-15 NOTE — Interval H&P Note (Signed)
 History and Physical Interval Note:  08/15/2023 8:14 AM  Janet Lynn  has presented today for surgery, with the diagnosis of RIGHT BREAST CANCER.  The various methods of treatment have been discussed with the patient and family. After consideration of risks, benefits and other options for treatment, the patient has consented to  Procedure(s) with comments: RE-EXCISION RIGHT BREAST LUMPECTOMY (Right) - LMA as a surgical intervention.  The patient's history has been reviewed, patient examined, no change in status, stable for surgery.  I have reviewed the patient's chart and labs.  Questions were answered to the patient's satisfaction.     Emelia Loron

## 2023-08-15 NOTE — Transfer of Care (Signed)
 Immediate Anesthesia Transfer of Care Note  Patient: Janet Lynn  Procedure(s) Performed: RE-EXCISION RIGHT BREAST LUMPECTOMY (Right: Breast)  Patient Location: PACU  Anesthesia Type:General  Level of Consciousness: awake and patient cooperative  Airway & Oxygen Therapy: Patient Spontanous Breathing and Patient connected to face mask oxygen  Post-op Assessment: Report given to RN and Post -op Vital signs reviewed and stable  Post vital signs: Reviewed and stable  Last Vitals:  Vitals Value Taken Time  BP 125/85 08/15/23 0949  Temp    Pulse 60 08/15/23 0952  Resp 11 08/15/23 0952  SpO2 100 % 08/15/23 0952  Vitals shown include unfiled device data.  Last Pain:  Vitals:   08/15/23 0829  TempSrc: Oral  PainSc: 0-No pain         Complications: No notable events documented.

## 2023-08-15 NOTE — Anesthesia Preprocedure Evaluation (Addendum)
 Anesthesia Evaluation  Patient identified by MRN, date of birth, ID band Patient awake    Reviewed: Allergy & Precautions, NPO status , Patient's Chart, lab work & pertinent test results  History of Anesthesia Complications (+) PONV and history of anesthetic complications  Airway Mallampati: II  TM Distance: >3 FB Neck ROM: Full    Dental no notable dental hx.    Pulmonary neg pulmonary ROS   Pulmonary exam normal        Cardiovascular negative cardio ROS Normal cardiovascular exam     Neuro/Psych  Headaches  negative psych ROS   GI/Hepatic Neg liver ROS, hiatal hernia, PUD,GERD  Medicated,,  Endo/Other  negative endocrine ROS    Renal/GU negative Renal ROS  negative genitourinary   Musculoskeletal negative musculoskeletal ROS (+)    Abdominal   Peds  Hematology negative hematology ROS (+)   Anesthesia Other Findings Right breast ca  Reproductive/Obstetrics negative OB ROS                             Anesthesia Physical Anesthesia Plan  ASA: 2  Anesthesia Plan: General   Post-op Pain Management: Tylenol PO (pre-op)*   Induction: Intravenous  PONV Risk Score and Plan: 4 or greater and Midazolam, Scopolamine patch - Pre-op, Treatment may vary due to age or medical condition, Dexamethasone, Ondansetron, Propofol infusion and TIVA  Airway Management Planned: LMA  Additional Equipment: None  Intra-op Plan:   Post-operative Plan: Extubation in OR  Informed Consent: I have reviewed the patients History and Physical, chart, labs and discussed the procedure including the risks, benefits and alternatives for the proposed anesthesia with the patient or authorized representative who has indicated his/her understanding and acceptance.     Dental advisory given  Plan Discussed with: CRNA  Anesthesia Plan Comments:        Anesthesia Quick Evaluation

## 2023-08-15 NOTE — Anesthesia Procedure Notes (Signed)
 Procedure Name: LMA Insertion Date/Time: 08/15/2023 9:05 AM  Performed by: Earmon Phoenix, CRNAPre-anesthesia Checklist: Patient identified, Emergency Drugs available, Suction available, Patient being monitored and Timeout performed Patient Re-evaluated:Patient Re-evaluated prior to induction Oxygen Delivery Method: Circle system utilized Preoxygenation: Pre-oxygenation with 100% oxygen Induction Type: IV induction Ventilation: Mask ventilation without difficulty LMA: LMA inserted LMA Size: 4.0 Number of attempts: 1 Placement Confirmation: positive ETCO2 and breath sounds checked- equal and bilateral Tube secured with: Tape Dental Injury: Teeth and Oropharynx as per pre-operative assessment

## 2023-08-15 NOTE — Discharge Instructions (Addendum)
 [facility] Office Phone Number [phone]  POST OP INSTRUCTIONS Take 400 mg of ibuprofen every 8 hours or 650 mg tylenol every 6 hours for next 72 hours then as needed. Use ice several times daily also.  A prescription for pain medication may be given to you upon discharge.  Take your pain medication as prescribed, if needed.  If narcotic pain medicine is not needed, then you may take acetaminophen (Tylenol), naprosyn (Alleve) or ibuprofen (Advil) as needed. Take your usually prescribed medications unless otherwise directed If you need a refill on your pain medication, please contact your pharmacy.  They will contact our office to request authorization.  Prescriptions will not be filled after 5pm or on week-ends. You should eat very light the first 24 hours after surgery, such as soup, crackers, pudding, etc.  Resume your normal diet the day after surgery. Most patients will experience some swelling and bruising in the breast.  Ice packs and a good support bra will help.  Wear the breast binder provided or a sports bra for 72 hours day and night.  After that wear a sports bra during the day until you return to the office. Swelling and bruising can take several days to resolve.  It is common to experience some constipation if taking pain medication after surgery.  Increasing fluid intake and taking a stool softener will usually help or prevent this problem from occurring.  A mild laxative (Milk of Magnesia or Miralax) should be taken according to package directions if there are no bowel movements after 48 hours. I used skin glue on the incision, you may shower in 24 hours.  The glue will flake off over the next 2-3 weeks.  Any sutures or staples will be removed at the office during your follow-up visit. ACTIVITIES:  You may resume regular daily activities (gradually increasing) beginning the next day.  Wearing a good support bra or sports bra minimizes pain and swelling.  You may have  sexual intercourse when it is comfortable. You may drive when you no longer are taking prescription pain medication, you can comfortably wear a seatbelt, and you can safely maneuver your car and apply brakes. RETURN TO WORK:  ______________________________________________________________________________________ Janet Lynn should see your doctor in the office for a follow-up appointment approximately two weeks after your surgery.  Your doctor's nurse will typically make your follow-up appointment when she calls you with your pathology report.  Expect your pathology report 3-4 business days after your surgery.  You may call to check if you do not hear from [place] after three days. OTHER INSTRUCTIONS: _______________________________________________________________________________________________ _____________________________________________________________________________________________________________________________________ _____________________________________________________________________________________________________________________________________ _____________________________________________________________________________________________________________________________________  WHEN TO CALL DR WAKEFIELD: Fever over 101.0 Nausea and/or vomiting. Extreme swelling or bruising. Continued bleeding from incision. Increased pain, redness, or drainage from the incision.  The clinic staff is available to answer your questions during regular business hours.  Please don't hesitate to call and ask to speak to one of the nurses for clinical concerns.  If you have a medical emergency, go to the nearest emergency room or call 911.  A surgeon from [facility] is always on call at the hospital.  For further questions, please visit [email or web address]    Post Anesthesia Home Care Instructions  Activity: Get plenty of rest for the remainder of the day. A responsible individual must stay  with you for 24 hours following the procedure.  For the next 24 hours, DO NOT: -Drive a car -Advertising copywriter -Drink alcoholic beverages -Take any medication unless instructed  by your physician -Make any legal decisions or sign important papers.  Meals: Start with liquid foods such as gelatin or soup. Progress to regular foods as tolerated. Avoid greasy, spicy, heavy foods. If nausea and/or vomiting occur, drink only clear liquids until the nausea and/or vomiting subsides. Call your physician if vomiting continues.  Special Instructions/Symptoms: Your throat may feel dry or sore from the anesthesia or the breathing tube placed in your throat during surgery. If this causes discomfort, gargle with warm salt water. The discomfort should disappear within 24 hours.  If you had a scopolamine patch placed behind your ear for the management of post- operative nausea and/or vomiting:  1. The medication in the patch is effective for 72 hours, after which it should be removed.  Wrap patch in a tissue and discard in the trash. Wash hands thoroughly with soap and water. 2. You may remove the patch earlier than 72 hours if you experience unpleasant side effects which may include dry mouth, dizziness or visual disturbances. 3. Avoid touching the patch. Wash your hands with soap and water after contact with the patch.     Last received tylenol at 830am

## 2023-08-15 NOTE — Op Note (Signed)
 Preoperative diagnosis: Clinical stage I right breast cancer status post lumpectomy and sentinel lymph node biopsy with positive margins Postoperative diagnosis: Same as above Procedure: Reexcision right breast lumpectomy Surgeon: Dr. Harden Mo Anesthesia: General Estimated blood loss: Minimal Specimens: Superior, inferior, lateral margin all marked short superior, long lateral, double deep Complications: Drains: None Sponge and count was correct completion Disposition to recovery stable condition  Indications: This is a 53 year old female who has already undergone a Lumpectomy and sentinel lymph node biopsy for a grade 2 invasive lobular carcinoma.This ends up being an over 3 cm invasive lobular carcinoma.  I have reviewed the specimens.  The posterior margin on the first specimen is the second specimen.  That leaves 3 margins that are positive.  Her Oncotype is now come back at 39.  She is due to begin radiation therapy once I clear margins.  We discussed reexcision lumpectomy.  We discussed the small possibility of this is more extensive we would even have to do additional surgery.  Procedure: After informed consent was obtained she was taken to the operating room.  She was given antibiotics.  SCDs were in place.  She was placed under general anesthesia without complication.  She was prepped and draped in standard sterile surgical fashion.  A surgical timeout was then performed.  I infiltrated Marcaine throughout her outer breast.  I did also did this at her old incision.  Then reentered the old incision.  I released all my sutures with some difficulty to expose the cavity.  I then reviewed the pathology report in the operating room again.  I then remove the superior, inferior, lateral margins of the cavity and marked them as above.  I then closed the cavity with 2-0 Vicryl after obtaining hemostasis.  This was closed in several layers.  I then closed the skin with 3-0 Vicryl for Monocryl.   Glue and Steri-Strips were applied.  She tolerated this well was transferred to recovery stable.

## 2023-08-15 NOTE — Anesthesia Postprocedure Evaluation (Signed)
 Anesthesia Post Note  Patient: Location manager  Procedure(s) Performed: RE-EXCISION RIGHT BREAST LUMPECTOMY (Right: Breast)     Patient location during evaluation: PACU Anesthesia Type: General Level of consciousness: awake and alert Pain management: pain level controlled Vital Signs Assessment: post-procedure vital signs reviewed and stable Respiratory status: spontaneous breathing, nonlabored ventilation and respiratory function stable Cardiovascular status: blood pressure returned to baseline Postop Assessment: no apparent nausea or vomiting Anesthetic complications: no   No notable events documented.  Last Vitals:  Vitals:   08/15/23 1047 08/15/23 1101  BP: 139/82 (!) 153/98  Pulse: (!) 57 (!) 59  Resp: 13 14  Temp:  36.6 C  SpO2: 96% 96%                  Shanda Howells

## 2023-08-16 ENCOUNTER — Encounter (HOSPITAL_BASED_OUTPATIENT_CLINIC_OR_DEPARTMENT_OTHER): Payer: Self-pay | Admitting: General Surgery

## 2023-08-17 LAB — SURGICAL PATHOLOGY

## 2023-08-22 ENCOUNTER — Ambulatory Visit
Admission: RE | Admit: 2023-08-22 | Discharge: 2023-08-22 | Disposition: A | Discharge status: Home / Self Care | Payer: BC Managed Care – PPO | Source: Ambulatory Visit | Attending: Radiation Oncology | Admitting: Radiation Oncology

## 2023-08-22 ENCOUNTER — Inpatient Hospital Stay: Payer: BC Managed Care – PPO | Attending: Internal Medicine | Admitting: Internal Medicine

## 2023-08-22 ENCOUNTER — Encounter: Payer: Self-pay | Admitting: Internal Medicine

## 2023-08-22 ENCOUNTER — Ambulatory Visit: Payer: BC Managed Care – PPO | Admitting: Internal Medicine

## 2023-08-22 VITALS — BP 140/90 | HR 80 | Temp 97.9°F | Resp 18 | Wt 203.5 lb

## 2023-08-22 VITALS — BP 134/102 | HR 99 | Temp 97.0°F | Resp 16 | Ht 61.0 in | Wt 203.6 lb

## 2023-08-22 DIAGNOSIS — C50911 Malignant neoplasm of unspecified site of right female breast: Secondary | ICD-10-CM

## 2023-08-22 DIAGNOSIS — K219 Gastro-esophageal reflux disease without esophagitis: Secondary | ICD-10-CM | POA: Insufficient documentation

## 2023-08-22 DIAGNOSIS — Z9851 Tubal ligation status: Secondary | ICD-10-CM | POA: Diagnosis not present

## 2023-08-22 DIAGNOSIS — Z1721 Progesterone receptor positive status: Secondary | ICD-10-CM | POA: Insufficient documentation

## 2023-08-22 DIAGNOSIS — Z79811 Long term (current) use of aromatase inhibitors: Secondary | ICD-10-CM | POA: Diagnosis not present

## 2023-08-22 DIAGNOSIS — C50811 Malignant neoplasm of overlapping sites of right female breast: Secondary | ICD-10-CM | POA: Insufficient documentation

## 2023-08-22 DIAGNOSIS — Z9189 Other specified personal risk factors, not elsewhere classified: Secondary | ICD-10-CM | POA: Diagnosis not present

## 2023-08-22 DIAGNOSIS — Z803 Family history of malignant neoplasm of breast: Secondary | ICD-10-CM | POA: Insufficient documentation

## 2023-08-22 DIAGNOSIS — Z8249 Family history of ischemic heart disease and other diseases of the circulatory system: Secondary | ICD-10-CM | POA: Insufficient documentation

## 2023-08-22 DIAGNOSIS — Z9049 Acquired absence of other specified parts of digestive tract: Secondary | ICD-10-CM | POA: Diagnosis not present

## 2023-08-22 DIAGNOSIS — Z800 Family history of malignant neoplasm of digestive organs: Secondary | ICD-10-CM | POA: Insufficient documentation

## 2023-08-22 DIAGNOSIS — Z881 Allergy status to other antibiotic agents status: Secondary | ICD-10-CM | POA: Insufficient documentation

## 2023-08-22 DIAGNOSIS — K449 Diaphragmatic hernia without obstruction or gangrene: Secondary | ICD-10-CM | POA: Insufficient documentation

## 2023-08-22 DIAGNOSIS — Z79899 Other long term (current) drug therapy: Secondary | ICD-10-CM | POA: Insufficient documentation

## 2023-08-22 DIAGNOSIS — Z170 Estrogen receptor positive status [ER+]: Secondary | ICD-10-CM | POA: Insufficient documentation

## 2023-08-22 DIAGNOSIS — Z8379 Family history of other diseases of the digestive system: Secondary | ICD-10-CM | POA: Insufficient documentation

## 2023-08-22 DIAGNOSIS — Z833 Family history of diabetes mellitus: Secondary | ICD-10-CM | POA: Insufficient documentation

## 2023-08-22 DIAGNOSIS — Z9089 Acquired absence of other organs: Secondary | ICD-10-CM | POA: Diagnosis not present

## 2023-08-22 DIAGNOSIS — Z1732 Human epidermal growth factor receptor 2 negative status: Secondary | ICD-10-CM | POA: Diagnosis not present

## 2023-08-22 NOTE — Progress Notes (Signed)
 She can't have lidocaine pushed into her veins because it causes a reaction. She had her consultation just a minute ago with Dr. Aggie Cosier. She is having some pain in her right breast were she had the lumpectomy.

## 2023-08-22 NOTE — Progress Notes (Signed)
 [facility] CONSULT NOTE  Patient Care Team: Joaquim Nam, MD as PCP - General (Family Medicine) Annamaria Helling, MD as Consulting Physician (Obstetrics and Gynecology) Michaelyn Barter, MD as Consulting Physician (Oncology) Carmina Miller, MD as Consulting Physician (Radiation Oncology) Hulen Luster, RN as Oncology Nurse Navigator  REASON FOR REFFERAL: right breast cancer  CANCER STAGING   Cancer Staging  Breast cancer [facility]) Staging form: Breast, AJCC 8th Edition - Clinical: Stage IB (cT2, cN0, cM0, G2, ER+, PR+, HER2-) - Signed by Michaelyn Barter, MD on 06/27/2023 Stage prefix: Initial diagnosis Histologic grading system: 3 grade system   ASSESSMENT & PLAN:  Janet Lynn 53 y.o. female with pmh of GERD, hiatal hernia, migraine follows with medical oncology for right breast IDC, ER/PR positive HER2 negative.  Clinical stage Ib.  # Right breast IDC, ER PR positive, HER2 negative -Detected on screening mammogram.  Diagnostic mammogram/ultrasound showed 2 x 1.2 x 1.5 cm mass in the right breast 9:00 6 cm from the nipple.  No abnormal lymph nodes identified in right axilla.  -Status post biopsy right breast 9:00 6 cm from nipple which showed invasive mammary carcinoma.  Overall grade 2.  ER 95% positive, PR 100% positive, HER2 1+ by IHC negative, Ki-67 5%.  Further addendum noted from pathology which showed E-cadherin negative supporting lobular features.  -s/p Right breast lumpectomy with SLNB by Dr. Dwain Sarna on 07/19/2023.    - Pathology anterior lumpectomy site showed 1.3 x 0.9 x 0.5 cm invasive lobular carcinoma grade 2.  Positive margin posteriorly.  Ki-67 5%.  0/1 lymph node negative for malignancy. Right breast lumpectomy with clip-ILC, 1.8 cm, margins positive superior and inferior lateral. s/p reexcision of margins on 08/15/2023-negative.  - Oncotype DX score of 13.  No role for adjuvant chemotherapy.  -Patient was seen by Dr. Aggie Cosier today.  Plan to start  adjuvant radiation.  After completion of radiation, we will start her on letrozole 2.5 mg daily for at least 5 years.  Side effects were rediscussed.  Will obtain baseline bone density scan.  Advised to start calcium vitamin D supplements.  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  Follow-up in 2 months for MD visit, labs.  Orders Placed This Encounter  Procedures   CBC with Differential/Platelet    Standing Status:   Future    Expected Date:   11/22/2023    Expiration Date:   08/21/2024   Comprehensive metabolic panel    Standing Status:   Future    Expected Date:   11/22/2023    Expiration Date:   08/21/2024    The total time spent in the appointment was 30 minutes encounter with patients including review of chart and various tests results, discussions about plan of care and coordination of care plan   All questions were answered. The patient knows to call the clinic with any problems, questions or concerns. No barriers to learning was detected.  Michaelyn Barter, MD 3/24/20253:09 PM   HISTORY OF PRESENTING ILLNESS:  Janet Lynn 53 y.o. female with pmh of GERD, hiatal hernia, migraine follows with medical oncology for right breast IDC, ER/PR positive HER2 negative.  Clinical stage Ib.  Interval history Patient was seen today as follow-up post surgery to discuss about the next treatment option. Doing well overall.  She has some tenderness from the reexcision.  Seen by Dr. Aggie Cosier and will be starting radiation in couple of weeks.  Denies any other concerns.  I have reviewed  her chart and materials related to her cancer extensively and collaborated history with the patient. Summary of oncologic history is as follows: Oncology History  Breast cancer (HCC)  05/13/2023 Mammogram   Screening mammogram from 05/13/2023 Showed abnormal mass in the right breast.  Diagnostic mammogram bilateral (06/16/2023) FINDINGS: Additional tomograms were performed of the bilateral  breast. There is a spiculated mass in the outer right breast measuring 1.7 cm. There is an oval circumscribed mass in the outer left breast measuring 0.7 cm.   Physical examination of the outer right breast reveals a firm masslike area of thickening at the approximate 8:30 to 9 o'clock position.   Targeted ultrasound of the right breast was performed. There is an irregular hypoechoic mass in the right breast at 9 o'clock 6 cm from nipple measuring 2 x 1.2 x 1.5 cm. This corresponds well with the mass seen in the right breast at mammography. No abnormal lymph nodes identified in the right axilla.   Targeted ultrasound of the left breast was performed. There is a cluster of microcysts in the left breast at 3 o'clock 3 cm from nipple measuring 0.8 x 0.4 x 0.8 cm. This corresponds well with the mass seen in the outer left breast at mammography.   IMPRESSION: Suspicious 2 cm mass in the right breast at the 9 o'clock position.     06/21/2023 Pathology Results   FINAL DIAGNOSIS        1. Breast, right, needle core biopsy, 9:00 6cmfn coil clip :       -  INVASIVE MAMMARY CARCINOMA   The biopsy material shows an infiltrative proliferation of cells with large vesicular nuclei with inconspicuous nucleoli, arranged linearly and in small clusters. Based on the biopsy, the carcinoma appears Nottingham grade 2 of 3 and measures 1.0 cm in greatest linear extent.   Results:  IMMUNOHISTOCHEMICAL AND MORPHOMETRIC ANALYSIS PERFORMED MANUALLY  The tumor cells are negative for Her2 (1+).  Estrogen Receptor:  95%, POSITIVE, STRONG STAINING INTENSITY  Progesterone Receptor:  100%, POSITIVE, STRONG STAINING INTENSITY  Proliferation Marker Ki67:  5%    06/27/2023 Cancer Staging   Staging form: Breast, AJCC 8th Edition - Clinical: Stage IB (cT2, cN0, cM0, G2, ER+, PR+, HER2-) - Signed by Michaelyn Barter, MD on 06/27/2023 Stage prefix: Initial diagnosis Histologic grading system: 3 grade system    07/07/2023 Genetic Testing   Negative genetic testing on the BRCAPlus and CancerNext-Expanded+RNA panel  FH p.F225S (c.674T>C) VUS identified.  The report date is July 07, 2023.  The CancerNext-Expanded gene panel offered by [facility] and includes sequencing, rearrangement, and RNA analysis for the following 76 genes: AIP, ALK, APC, ATM, AXIN2, BAP1, BARD1, BMPR1A, BRCA1, BRCA2, BRIP1, CDC73, CDH1, CDK4, CDKN1B, CDKN2A, CEBPA, CHEK2, CTNNA1, DDX41, DICER1, ETV6, FH, FLCN, GATA2, LZTR1, MAX, MBD4, MEN1, MET, MLH1, MSH2, MSH3, MSH6, MUTYH, NF1, NF2, NTHL1, PALB2, PHOX2B, PMS2, POT1, PRKAR1A, PTCH1, PTEN, RAD51C, RAD51D, RB1, RET, RUNX1, SDHA, SDHAF2, SDHB, SDHC, SDHD, SMAD4, SMARCA4, SMARCB1, SMARCE1, STK11, SUFU, TMEM127, TP53, TSC1, TSC2, VHL, and WT1 (sequencing and deletion/duplication); EGFR, HOXB13, KIT, MITF, PDGFRA, POLD1, and POLE (sequencing only); EPCAM and GREM1 (deletion/duplication only).  The BRCAPlus gene panel offered by [facility] and includes sequencing and rearrangement analysis for the following 13 genes: ATM, BARD1, BRCA1, BRCA2, CDH1, CHEK2, NF1, PALB2, PTEN, RAD51C, RAD51D, STK11 and TP53.     Menarche age 34 2 children.  Age at first birth 62 years Birth control yes.  OCP less than 5 years. Menopause around age  50. History of breast biopsies no Paternal aunt x 2 -breast cancer. Paternal cousin breast cancer Paternal grandmother liver cancer  MEDICAL HISTORY:  Past Medical History:  Diagnosis Date   Allergy    Family history of breast cancer    GERD (gastroesophageal reflux disease)    History of hiatal hernia    Migraine with aura    PONV (postoperative nausea and vomiting)    Vertigo     SURGICAL HISTORY: Past Surgical History:  Procedure Laterality Date   APPENDECTOMY  05/31/1985   BREAST BIOPSY Right 06/21/2023   [place] RT BREAST BX W LOC DEV 1ST LESION IMG BX SPEC US GUIDE 06/21/2023 [facility]   BREAST BIOPSY  07/18/2023   MM RT RADIOACTIVE  SEED LOC MAMMO GUIDE 07/18/2023 [facility]   BREAST LUMPECTOMY WITH RADIOACTIVE SEED AND SENTINEL LYMPH NODE BIOPSY Right 07/19/2023   Procedure: RIGHT BREAST SEED GUIDED LUMPECTOMY AND RIGHT AXILLARY SENTINEL NODE BIOPSY;  Surgeon: Emelia Loron, MD;  Location: [facility];  Service: General;  Laterality: Right;  LMA PEC BLOCK   BREAST REDUCTION SURGERY     CHOLECYSTECTOMY     COLONOSCOPY WITH PROPOFOL N/A 05/12/2021   Procedure: COLONOSCOPY WITH PROPOFOL;  Surgeon: Toney Reil, MD;  Location: [facility];  Service: Gastroenterology;  Laterality: N/A;   ESOPHAGOGASTRODUODENOSCOPY (EGD) WITH PROPOFOL N/A 05/12/2021   Procedure: ESOPHAGOGASTRODUODENOSCOPY (EGD) WITH PROPOFOL;  Surgeon: Toney Reil, MD;  Location: [facility] END[facility]COPY;  Service: Gastroenterology;  Laterality: N/A;   ESOPHAGOGASTRODUODENOSCOPY (EGD) WITH PROPOFOL N/A 02/23/2022   Procedure: ESOPHAGOGASTRODUODENOSCOPY (EGD) WITH BIOPSIES;  Surgeon: Toney Reil, MD;  Location: [facility];  Service: Endoscopy;  Laterality: N/A;   ESOPHAGOGASTRODUODENOSCOPY (EGD) WITH PROPOFOL N/A 05/28/2022   Procedure: ESOPHAGOGASTRODUODENOSCOPY (EGD) WITH PROPOFOL;  Surgeon: Toney Reil, MD;  Location: [facility] END[facility]COPY;  Service: Gastroenterology;  Laterality: N/A;   RE-EXCISION OF BREAST LUMPECTOMY Right 08/15/2023   Procedure: RE-EXCISION RIGHT BREAST LUMPECTOMY;  Surgeon: Emelia Loron, MD;  Location: [facility];  Service: General;  Laterality: Right;  LMA   THYROID SURGERY  06/01/1987   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   TONSILLECTOMY     TUBAL LIGATION     bilateral    SOCIAL HISTORY: Social History   Socioeconomic History   Marital status: Married    Spouse name: Not on file   Number of children: 2   Years of education: Not on file   Highest education level: Not on file  Occupational History    Employer: [employer]  Tobacco Use   Smoking status:  Never   Smokeless tobacco: Never  Vaping Use   Vaping status: Never Used  Substance and Sexual Activity   Alcohol use: No   Drug use: No   Sexual activity: Not on file  Other Topics Concern   Not on file  Social History Narrative   Married 1995 and lives with husband   2 boys, both adult (1 son is working as a Curator for the [employer])   Works at [employer] Strain: Not on file  Food Insecurity: No Food Insecurity (06/03/2023)   Hunger Vital Sign    Worried About Running Out of Food in the Last Year: Never true    Ran Out of Food in the Last Year: Never true  Transportation Needs: No Transportation Needs (06/03/2023)   PRAPARE - Administrator, Civil Service (Medical): No  Lack of Transportation (Non-Medical): No  Physical Activity: Not on file  Stress: Not on file  Social Connections: Not on file  Intimate Partner Violence: Not At Risk (06/03/2023)   Humiliation, Afraid, Rape, and Kick questionnaire    Fear of Current or Ex-Partner: No    Emotionally Abused: No    Physically Abused: No    Sexually Abused: No    FAMILY HISTORY: Family History  Problem Relation Age of Onset   Hypertension Mother    Diabetes Mother    Breast cancer Paternal Aunt        dx. 60s   Breast cancer Paternal Aunt        dx. 60s   Cirrhosis Paternal Uncle    Heart disease Maternal Grandmother    Heart attack Maternal Grandfather    Liver cancer Paternal Grandmother 39   Heart disease Paternal Grandfather    Breast cancer Other        PGM's sister   Breast cancer Cousin        father's maternal cousin   Breast cancer Cousin 56       paternal first cousin   Colon cancer Neg Hx     ALLERGIES:  is allergic to erythromycin.  MEDICATIONS:  Current Outpatient Medications  Medication Sig Dispense Refill   acetaminophen (TYLENOL) 500 MG tablet Take 2 tablets (1,000 mg total) by mouth every 6 (six) hours as needed for  mild pain.     cyclobenzaprine (FLEXERIL) 5 MG tablet Take 1 tablet (5 mg total) by mouth 2 (two) times daily as needed (for migraines.  sedation caution.). 30 tablet 1   ibuprofen (ADVIL) 800 MG tablet Take 800 mg by mouth every 6 (six) hours as needed.     meclizine (ANTIVERT) 25 MG tablet Take 1/2 to one tablet prn vertigo 20 tablet 0   omeprazole (PRILOSEC) 40 MG capsule Take 1 capsule (40 mg total) by mouth daily. 90 capsule 3   oxyCODONE (OXY IR/ROXICODONE) 5 MG immediate release tablet Take 1 tablet (5 mg total) by mouth every 6 (six) hours as needed. 10 tablet 0   No current facility-administered medications for this visit.    REVIEW OF SYSTEMS:   Pertinent information mentioned in HPI All other systems were reviewed with the patient and are negative.  PHYSICAL EXAMINATION: ECOG PERFORMANCE STATUS: 1 - Symptomatic but completely ambulatory  Vitals:   08/22/23 1418  BP: (!) 140/90  Pulse: 80  Resp: 18  Temp: 97.9 F (36.6 C)  SpO2: 99%    Filed Weights   08/22/23 1418  Weight: 203 lb 8 oz (92.3 kg)     GENERAL:alert, no distress and comfortable SKIN: skin color, texture, turgor are normal, no rashes or significant lesions EYES: normal, conjunctiva are pink and non-injected, sclera clear OROPHARYNX:no exudate, no erythema and lips, buccal mucosa, and tongue normal  NECK: supple, thyroid normal size, non-tender, without nodularity LYMPH:  no palpable lymphadenopathy in the cervical, axillary or inguinal LUNGS: clear to auscultation and percussion with normal breathing effort HEART: regular rate & rhythm and no murmurs and no lower extremity edema ABDOMEN:abdomen soft, non-tender and normal bowel sounds Musculoskeletal:no cyanosis of digits and no clubbing  PSYCH: alert & oriented x 3 with fluent speech NEURO: no focal motor/sensory deficits  LABORATORY DATA:  I have reviewed the data as listed Lab Results  Component Value Date   WBC 11.0 (H) 06/03/2023   HGB  14.5 06/03/2023   HCT 43.6 06/03/2023   MCV 88.8  06/03/2023   PLT 416 (H) 06/03/2023   Recent Labs    01/25/23 0805  NA 137  K 4.5  CL 103  CO2 28  GLUCOSE 99  BUN 19  CREATININE 1.05  CALCIUM 9.2  PROT 6.8  ALBUMIN 3.9  AST 17  ALT 21  ALKPHOS 76  BILITOT 0.3    RADIOGRAPHIC STUDIES: I have personally reviewed the radiological images as listed and agreed with the findings in the report. No results found.

## 2023-08-22 NOTE — Consult Note (Signed)
 NEW PATIENT EVALUATION  Name: Janet Lynn  MRN: [id number]  Date:   08/22/2023     DOB: 1971-03-30   This 53 y.o. female patient presents to the clinic for initial evaluation of ER positive invasive mammary carcinoma the right breast status post wide local excision for a pathologic stage Ia (T1b N0 M0).  REFERRING PHYSICIAN: Joaquim Nam, MD  CHIEF COMPLAINT:  Chief Complaint  Patient presents with   Consult    DIAGNOSIS: The encounter diagnosis was Malignant neoplasm of right female breast, unspecified estrogen receptor status, unspecified site of breast (HCC).   PREVIOUS INVESTIGATIONS:  Mammogram and ultrasound reviewed Pathology reports reviewed Clinical notes reviewed  HPI: Patient is a 53 year old female who presents with an abnormal Mammogram of her right breast.  There was a 2 cm mass at the 9 o'clock position 6 cm from the nipple.  No abnormal adenopathy was noted in her right axilla on ultrasound examination.  She underwent stereotactic biopsy which was positive for invasive mammary carcinoma.  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  1 sentinel lymph node was negative for metastatic disease.  There were multiple positive margins and she underwent reexcision with clear margins.  Her breast is rather sore at this time she is only been out of surgery for the reexcision 1 week.  She otherwise specifically denies cough or bone pain.  Her tumor is ER/PR positive HER2/neu not overexpressed.  Her Oncotype DX came back at 27 and she will not have chemotherapy.  She will be a candidate after radiation for endocrine therapy.  PLANNED TREATMENT REGIMEN: Right hypofractionated whole breast radiation  PAST MEDICAL HISTORY:  has a past medical history of Allergy, Family history of breast cancer, GERD (gastroesophageal reflux disease), History of hiatal hernia, Migraine with aura, PONV (postoperative nausea  and vomiting), and Vertigo.    PAST SURGICAL HISTORY:  Past Surgical History:  Procedure Laterality Date   APPENDECTOMY  05/31/1985   BREAST BIOPSY Right 06/21/2023   [place] RT BREAST BX W LOC DEV 1ST LESION IMG BX SPEC US GUIDE 06/21/2023 [facility]   BREAST BIOPSY  07/18/2023   MM RT RADIOACTIVE SEED LOC MAMMO GUIDE 07/18/2023 [facility]   BREAST LUMPECTOMY WITH RADIOACTIVE SEED AND SENTINEL LYMPH NODE BIOPSY Right 07/19/2023   Procedure: RIGHT BREAST SEED GUIDED LUMPECTOMY AND RIGHT AXILLARY SENTINEL NODE BIOPSY;  Surgeon: Emelia Loron, MD;  Location: [facility];  Service: General;  Laterality: Right;  LMA PEC BLOCK   BREAST REDUCTION SURGERY     CHOLECYSTECTOMY     COLONOSCOPY WITH PROPOFOL N/A 05/12/2021   Procedure: COLONOSCOPY WITH PROPOFOL;  Surgeon: Toney Reil, MD;  Location: [facility];  Service: Gastroenterology;  Laterality: N/A;   ESOPHAGOGASTRODUODENOSCOPY (EGD) WITH PROPOFOL N/A 05/12/2021   Procedure: ESOPHAGOGASTRODUODENOSCOPY (EGD) WITH PROPOFOL;  Surgeon: Toney Reil, MD;  Location[facility];  Service: Gastroenterology;  Laterality: N/A;   ESOPHAGOGASTRODUODENOSCOPY (EGD) WITH PROPOFOL N/A 02/23/2022   Procedure: ESOPHAGOGASTRODUODENOSCOPY (EGD) WITH BIOPSIES;  Surgeon: Toney Reil, MD;  Location: [facility];  Service: Endoscopy;  Laterality: N/A;   ESOPHAGOGASTRODUODENOSCOPY (EGD) WITH PROPOFOL N/A 05/28/2022   Procedure: ESOPHAGOGASTRODUODENOSCOPY (EGD) WITH PROPOFOL;  Surgeon: Toney Reil, MD;  Location: [facility];  Service: Gastroenterology;  Laterality: N/A;   RE-EXCISION OF BREAST LUMPECTOMY Right 08/15/2023   Procedure: RE-EXCISION RIGHT BREAST LUMPECTOMY;  Surgeon: Emelia Loron, MD;  Location: [facility];  Service: General;  Laterality: Right;  LMA   THYROID SURGERY  06/01/1987   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   TONSILLECTOMY     TUBAL LIGATION      bilateral    FAMILY HISTORY: family history includes Breast cancer in her cousin, paternal aunt, paternal aunt, and another family member; Breast cancer (age of onset: 49) in her cousin; Cirrhosis in her paternal uncle; Diabetes in her mother; Heart attack in her maternal grandfather; Heart disease in her maternal grandmother and paternal grandfather; Hypertension in her mother; Liver cancer (age of onset: 74) in her paternal grandmother.  SOCIAL HISTORY:  reports that she has never smoked. She has never used smokeless tobacco. She reports that she does not drink alcohol and does not use drugs.  ALLERGIES: Erythromycin  MEDICATIONS:  Current Outpatient Medications  Medication Sig Dispense Refill   acetaminophen (TYLENOL) 500 MG tablet Take 2 tablets (1,000 mg total) by mouth every 6 (six) hours as needed for mild pain.     cyclobenzaprine (FLEXERIL) 5 MG tablet Take 1 tablet (5 mg total) by mouth 2 (two) times daily as needed (for migraines.  sedation caution.). 30 tablet 1   ibuprofen (ADVIL) 800 MG tablet Take 800 mg by mouth every 6 (six) hours as needed.     meclizine (ANTIVERT) 25 MG tablet Take 1/2 to one tablet prn vertigo 20 tablet 0   omeprazole (PRILOSEC) 40 MG capsule Take 1 capsule (40 mg total) by mouth daily. 90 capsule 3   oxyCODONE (OXY IR/ROXICODONE) 5 MG immediate release tablet Take 1 tablet (5 mg total) by mouth every 6 (six) hours as needed. 10 tablet 0   No current facility-administered medications for this encounter.    ECOG PERFORMANCE STATUS:  0 - Asymptomatic  REVIEW OF SYSTEMS: Patient denies any weight loss, fatigue, weakness, fever, chills or night sweats. Patient denies any loss of vision, blurred vision. Patient denies any ringing  of the ears or hearing loss. No irregular heartbeat. Patient denies heart murmur or history of fainting. Patient denies any chest pain or pain radiating to her upper extremities. Patient denies any shortness of breath, difficulty  breathing at night, cough or hemoptysis. Patient denies any swelling in the lower legs. Patient denies any nausea vomiting, vomiting of blood, or coffee ground material in the vomitus. Patient denies any stomach pain. Patient states has had normal bowel movements no significant constipation or diarrhea. Patient denies any dysuria, hematuria or significant nocturia. Patient denies any problems walking, swelling in the joints or loss of balance. Patient denies any skin changes, loss of hair or loss of weight. Patient denies any excessive worrying or anxiety or significant depression. Patient denies any problems with insomnia. Patient denies excessive thirst, polyuria, polydipsia. Patient denies any swollen glands, patient denies easy bruising or easy bleeding. Patient denies any recent infections, allergies or URI. Patient "s visual fields have not changed significantly in recent time.   PHYSICAL EXAM: BP (!) 134/102   Pulse 99   Temp (!) 97 F (36.1 C)   Resp 16   Ht 5\' 1"  (1.549 m)   Wt 203 lb 9.6 oz (92.4 kg)   LMP 10/29/2020   BMI 38.47 kg/m  She is status post wide local excision of the right breast.  Incision is healing well.  No dominant masses noted in either breast.  No axillary or supraclavicular adenopathy is appreciated.  Well-developed well-nourished patient in NAD. HEENT reveals PERLA, EOMI, discs not visualized.  Oral  cavity is clear. No oral mucosal lesions are identified. Neck is clear without evidence of cervical or supraclavicular adenopathy. Lungs are clear to A&P. Cardiac examination is essentially unremarkable with regular rate and rhythm without murmur rub or thrill. Abdomen is benign with no organomegaly or masses noted. Motor sensory and DTR levels are equal and symmetric in the upper and lower extremities. Cranial nerves II through XII are grossly intact. Proprioception is intact. No peripheral adenopathy or edema is identified. No motor or sensory levels are noted. Crude visual  fields are within normal range.  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  Would also boost her scar another 1000 centigrade using photon beam therapy.  Risks and benefits of treatment including skin reaction fatigue alteration blood counts possible inclusion of superficial lung all were described in detail to the patient.  I personally set up and ordered CT simulation for next week to allow some further healing of her breast.  Patient also be candidate for endocrine therapy after completion of radiation.  I would like to take this opportunity to thank you for allowing me to participate in the care of your patient.Carmina Miller, MD

## 2023-08-29 ENCOUNTER — Institutional Professional Consult (permissible substitution): Payer: BC Managed Care – PPO | Admitting: Radiation Oncology

## 2023-08-31 ENCOUNTER — Ambulatory Visit: Attending: Internal Medicine | Admitting: Occupational Therapy

## 2023-08-31 DIAGNOSIS — L905 Scar conditions and fibrosis of skin: Secondary | ICD-10-CM | POA: Insufficient documentation

## 2023-08-31 DIAGNOSIS — M25611 Stiffness of right shoulder, not elsewhere classified: Secondary | ICD-10-CM | POA: Insufficient documentation

## 2023-08-31 DIAGNOSIS — R293 Abnormal posture: Secondary | ICD-10-CM | POA: Diagnosis not present

## 2023-08-31 NOTE — Therapy (Signed)
 OUTPATIENT OCCUPATIONAL THERAPY BREAST CANCER BASELINE TREATMENT   Patient Name: Janet Lynn MRN: [id number] DOB:May 23, 1971, 53 y.o., female Today's Date: 08/31/2023  END OF SESSION:  OT End of Session - 08/31/23 0923     Visit Number 3    Number of Visits 4    Date for OT Re-Evaluation 08/31/23    OT Start Time 0901    OT Stop Time 0919    OT Time Calculation (min) 18 min    Activity Tolerance Patient tolerated treatment well    Behavior During Therapy WFL for tasks assessed/performed             Past Medical History:  Diagnosis Date   Allergy    Family history of breast cancer    GERD (gastroesophageal reflux disease)    History of hiatal hernia    Migraine with aura    PONV (postoperative nausea and vomiting)    Vertigo    Past Surgical History:  Procedure Laterality Date   APPENDECTOMY  05/31/1985   BREAST BIOPSY Right 06/21/2023   [place] RT BREAST BX W LOC DEV 1ST LESION IMG BX SPEC US GUIDE 06/21/2023 [facility]   BREAST BIOPSY  07/18/2023   MM RT RADIOACTIVE SEED LOC MAMMO GUIDE 07/18/2023 [facility]   BREAST LUMPECTOMY WITH RADIOACTIVE SEED AND SENTINEL LYMPH NODE BIOPSY Right 07/19/2023   Procedure: RIGHT BREAST SEED GUIDED LUMPECTOMY AND RIGHT AXILLARY SENTINEL NODE BIOPSY;  Surgeon: Emelia Loron, MD;  Location: [facility];  Service: General;  Laterality: Right;  LMA PEC BLOCK   BREAST REDUCTION SURGERY     CHOLECYSTECTOMY     COLONOSCOPY WITH PROPOFOL N/A 05/12/2021   Procedure: COLONOSCOPY WITH PROPOFOL;  Surgeon: Toney Reil, MD;  Location: [facility];  Service: Gastroenterology;  Laterality: N/A;   ESOPHAGOGASTRODUODENOSCOPY (EGD) WITH PROPOFOL N/A 05/12/2021   Procedure: ESOPHAGOGASTRODUODENOSCOPY (EGD) WITH PROPOFOL;  Surgeon: Toney Reil, MD;  Location: [facility];  Service: Gastroenterology;  Laterality: N/A;   ESOPHAGOGASTRODUODENOSCOPY (EGD) WITH PROPOFOL N/A 02/23/2022   Procedure:  ESOPHAGOGASTRODUODENOSCOPY (EGD) WITH BIOPSIES;  Surgeon: Toney Reil, MD;  Location: [facility];  Service: Endoscopy;  Laterality: N/A;   ESOPHAGOGASTRODUODENOSCOPY (EGD) WITH PROPOFOL N/A 05/28/2022   Procedure: ESOPHAGOGASTRODUODENOSCOPY (EGD) WITH PROPOFOL;  Surgeon: Toney Reil, MD;  Location: [facility];  Service: Gastroenterology;  Laterality: N/A;   RE-EXCISION OF BREAST LUMPECTOMY Right 08/15/2023   Procedure: RE-EXCISION RIGHT BREAST LUMPECTOMY;  Surgeon: Emelia Loron, MD;  Location:  SURGERY CENTER;  Service: General;  Laterality: Right;  LMA   THYROID SURGERY  06/01/1987   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   TONSILLECTOMY     TUBAL LIGATION     bilateral   Patient Active Problem List   Diagnosis Date Noted   At risk for bone density loss 08/22/2023   Genetic testing 07/08/2023   Family history of breast cancer    Breast cancer (HCC) 06/27/2023   Elevated platelet count 06/03/2023   Abnormal CBC 02/02/2023   Ulcer of esophagus without bleeding    Hiatal hernia with GERD without esophagitis    Advance care planning 01/27/2022   Dysphagia 01/27/2022   Epistaxis 01/27/2022   Migraine with aura 01/27/2022   Allergic rhinitis due to allergen 01/08/2022   Benign paroxysmal positional vertigo, right 01/08/2022   Erosive esophagitis    Routine general medical examination at a health care facility    Obesity with body mass index greater than 30 04/06/2021    PCP: Dr  Para March  REFERRING PROVIDER: Dr Geryl Councilman DIAG: R breast Cancer  THERAPY DIAG:  Scar tissue  Abnormal posture  Stiffness of right shoulder, not elsewhere classified  Rationale for Evaluation and Treatment: Rehabilitation  ONSET DATE: 07/19/23  SUBJECTIVE:                                                                                                                                                                                           SUBJECTIVE  STATEMENT: I had lumpectomy on 07/19/2023.  And then I needed re excision on 08/15/23 - but they got everything - breast still swollen - especially in the am - but I do ice and take some pain meds- and and it helps - I have one more appt with surgeon - my ROM is doing much better- no pulling since I have seen you - I am going to have radiation but no chemo   PERTINENT HISTORY:  Patient was diagnosed with right  breast cancer -patient had R lumpectomy - by Dr Dwain Sarna 07/19/23-patient will not have chemo but possible radiation.  She is having 08/15/2023 every excision.  PATIENT GOALS:   reduce lymphedema risk and learn post op HEP.   PAIN:  Are you having pain?  6/10 R breast-increase postop swelling   PRECAUTIONS: Active CA       HAND DOMINANCE: right  WEIGHT BEARING RESTRICTIONS: No  FALLS:  Has patient fallen in last 6 months? No  LIVING ENVIRONMENT: Patient lives with: Live with husband  OCCUPATION: Work [employer] on [employer]  LEISURE: Likes to [employer] - do not exercise-home making act   OBJECTIVE:  COGNITION: Overall cognitive status: Within functional limits for tasks assessed    POSTURE:  Forward head and rounded shoulders posture - work on computer   UPPER EXTREMITY AROM/PROM: 08/03/23 TREATMENT SESSION: Patient arrive with increased pain and decreased motion in right shoulder flexion and abduction external rotation.  Upon assessment appear patient has some lymphatic cording from incision and axilla into the upper arm to elbow. Manual therapy done by OT with traction able to release 5 of lymphatic cording with great progress in range of motion and relief in pain. Reviewed with patient again home exercises for shoulder flexion and abduction in supine using wand keeping pain under 3/10.  15 reps 3 times a day As well as external rotation with hand behind the head.  10 reps using gravity to assist With patient and shoulder external rotation or abduction reviewed with patient  light manual traction with hip rotation to the left to assist with facilitation of cording release patient can do at home.  08/31/23 TREATMENT  SESSION: Patient's right upper extremity shoulder range of motion improved to within normal limits.  No lymphatic cording present compared to 08/03/2023. Patient reports still swelling and increased edema on the right breast in the morning 6/10. Patient get radiation simulated tomorrow.  A/PROM RIGHT   eval  R 08/03/23 R 08/31/23  Shoulder extension   WNL  Shoulder flexion 180 130 end of session 145 WNL  Shoulder abduction 180 90 end of session 130  WNL  Shoulder internal rotation WNL    Shoulder external rotation 90 80 80    (Blank rows = not tested)  A/PROM LEFT   eval  Shoulder extension   Shoulder flexion 180  Shoulder abduction 180  Shoulder internal rotation WNL  Shoulder external rotation 90    (Blank rows = not tested)     CERVICAL AROM: All within normal limits:     UPPER EXTREMITY STRENGTH: 5/5 inshoulders   LYMPHEDEMA ASSESSMENTS:   LANDMARK RIGHT   eval  10 cm proximal to olecranon process   Olecranon process   10 cm proximal to ulnar styloid process   Just proximal to ulnar styloid process   Across hand at thumb web space   At base of 2nd digit   (Blank rows = not tested)  LANDMARK LEFT   eval  10 cm proximal to olecranon process   Olecranon process   10 cm proximal to ulnar styloid process   Just proximal to ulnar styloid process   Across hand at thumb web space   At base of 2nd digit   (Blank rows = not tested)  L-DEX LYMPHEDEMA SCREENING done today  The patient was assessed using the L-Dex machine today to produce a lymphedema index baseline score. The patient will be reassessed on a regular basis (typically every 3 months) to obtain new L-Dex scores. If the score is > 6.5 points away from his/her baseline score indicating onset of subclinical lymphedema, it will be recommended to wear a compression garment  for 4 weeks, 12 hours per day and then be reassessed. If the score continues to be > 6.5 points from baseline at reassessment, we will initiate lymphedema treatment. Assessing in this manner has a 95% rate of preventing clinically significant lymphedema.   L-DEX FLOWSHEETS - 08/31/23 0900       L-DEX LYMPHEDEMA SCREENING   Measurement Type Unilateral    L-DEX MEASUREMENT EXTREMITY Upper Extremity    POSITION  Standing    DOMINANT SIDE Right    At Risk Side Right    BASELINE SCORE (UNILATERAL) 3.6    L-DEX SCORE (UNILATERAL) 0.3    VALUE CHANGE (UNILAT) -3.3              PATIENT EDUCATION:  Education details: Lymphedema risk reduction and post op shoulder/posture HEP Person educated: Patient Education method: Explanation, Demonstration, Handout Education comprehension: Patient verbalized understanding and returned demonstration     ASSESSMENT:  CLINICAL IMPRESSION: Patient follow-up with OT postop.  She had R lumpectomy on 07/19/2023 by Dr. Dwain Sarna.  Last session patient had lymphatic cording limiting her range of motion.  -She had reexcision on 08/15/2023 -patient present today postop again with increased range of motion in right upper extremity.  Right shoulder flexion abduction extension within normal limits as well as external rotation same than preop.  Patient reports some increased swelling and edema in the right breast but doing some ice and medication.  Patient has 1 more follow-up with surgeon.  Patient met with radiation and is  having her simulation tomorrow.  Patient L-Dex  SOZO score within normal limits compared to preop  -patient to follow-up with Tobi Bastos breast navigator for a repeat of L-Dex after radiation.  If patient continues to have increased swelling, discomfort, tenderness and pain post radiation in right breast patient can follow-up with me again.  Patient will benefit from continued OT services to decrease  edema, scar tissue and pain- as well as decreased  lymphedema risk and from L-Dex screens  to detect subclinical lymphedema.  Pt will benefit from skilled therapeutic intervention to improve on the following deficits: Decreased knowledge of precautions and lymphedema education, impaired UE functional use, pain, decreased ROM, postural dysfunction.   OT treatment/interventions: ADL/self-care home management, pt/family education, therapeutic exercise,manual therapy  REHAB POTENTIAL: Good  CLINICAL DECISION MAKING: Stable/uncomplicated  EVALUATION COMPLEXITY: Low   GOALS: Goals reviewed with patient? YES  LONG TERM GOALS: (STG=LTG)    Name Target Date Goal status  1 Pt will be able to verbalize understanding of pertinent lymphedema risk reduction practices relevant to her dx specifically related to skin care.  Baseline:  No knowledge 08/31/23 CONT  2 Pt will be able to return demo and/or verbalize understanding of the post op HEP related to regaining shoulder ROM. Baseline:  No knowledge 08/31/2023 MET        4 Pt will demo she has regained full shoulder ROM and function post operatively compared to baselines.  Baseline: See objective measurements taken today. 08/31/23 MET    PLAN:  OT FREQUENCY/DURATION: 2-3  follow up appointment.   PLAN FOR NEXT SESSION: 4 wks   Patient will follow up at outpatient cancer rehab 2-4 weeks following surgery.  If the patient requires occupational therapy at that time, a specific plan will be dictated and sent to the referring physician for approval. T Occupational Therapy Information for After Breast Cancer Surgery/Treatment:  Lymphedema is a swelling condition that you may be at risk for in your arm if you have lymph nodes removed from the armpit area.  After a sentinel node biopsy, the risk is approximately 5-9% and is higher after an axillary node dissection.  There is treatment available for this condition and it is not life-threatening.  Contact your physician or occupational therapist with  concerns. You may begin the 4 shoulder/posture exercises (see additional sheet) when permitted by your physician (typically a week after surgery).  If you have drains, you may need to wait until those are removed before beginning range of motion exercises.  A general recommendation is to not lift your arms above shoulder height until drains are removed.  These exercises should be done to your tolerance and gently.  This is not a "no pain/no gain" type of recovery so listen to your body and stretch into the range of motion that you can tolerate, stopping if you have pain.  If you are having immediate reconstruction, ask your plastic surgeon about doing exercises as he or she may want you to wait. .  While undergoing any medical procedure or treatment, try to avoid blood pressure being taken or needle sticks from occurring on the arm on the side of cancer.   This recommendation begins after surgery and continues for the rest of your life.  This may help reduce your risk of getting lymphedema (swelling in your arm). An excellent resource for those seeking information on lymphedema is the National Lymphedema Network's web site. It can be accessed at www.lymphnet.org If you notice swelling in your hand, arm  or breast at any time following surgery (even if it is many years from now), please contact your doctor or occupational therapist to discuss this.  Lymphedema can be treated at any time but it is easier for you if it is treated early on.  If you feel like your shoulder motion is not returning to normal in a reasonable amount of time, please contact your surgeon or occupational therapist.  [facility] [phone]. [address] [place], [place] [address]      Oletta Cohn, OTR/L,CLT 08/31/2023, 9:27 AM

## 2023-09-01 ENCOUNTER — Ambulatory Visit
Admission: RE | Admit: 2023-09-01 | Discharge: 2023-09-01 | Disposition: A | Discharge status: Home / Self Care | Source: Ambulatory Visit | Attending: Radiation Oncology | Admitting: Radiation Oncology

## 2023-09-01 ENCOUNTER — Encounter: Payer: Self-pay | Admitting: *Deleted

## 2023-09-01 DIAGNOSIS — C50911 Malignant neoplasm of unspecified site of right female breast: Secondary | ICD-10-CM | POA: Diagnosis not present

## 2023-09-01 DIAGNOSIS — Z170 Estrogen receptor positive status [ER+]: Secondary | ICD-10-CM | POA: Insufficient documentation

## 2023-09-01 DIAGNOSIS — Z510 Encounter for antineoplastic radiation therapy: Secondary | ICD-10-CM | POA: Insufficient documentation

## 2023-09-02 ENCOUNTER — Other Ambulatory Visit: Payer: Self-pay | Admitting: *Deleted

## 2023-09-02 DIAGNOSIS — C50911 Malignant neoplasm of unspecified site of right female breast: Secondary | ICD-10-CM

## 2023-09-03 DIAGNOSIS — C50911 Malignant neoplasm of unspecified site of right female breast: Secondary | ICD-10-CM | POA: Diagnosis not present

## 2023-09-03 DIAGNOSIS — Z510 Encounter for antineoplastic radiation therapy: Secondary | ICD-10-CM | POA: Diagnosis not present

## 2023-09-03 DIAGNOSIS — Z170 Estrogen receptor positive status [ER+]: Secondary | ICD-10-CM | POA: Diagnosis not present

## 2023-09-08 ENCOUNTER — Ambulatory Visit
Admission: RE | Admit: 2023-09-08 | Discharge: 2023-09-08 | Disposition: A | Discharge status: Home / Self Care | Source: Ambulatory Visit | Attending: Radiation Oncology | Admitting: Radiation Oncology

## 2023-09-08 DIAGNOSIS — Z510 Encounter for antineoplastic radiation therapy: Secondary | ICD-10-CM | POA: Diagnosis not present

## 2023-09-08 DIAGNOSIS — C50911 Malignant neoplasm of unspecified site of right female breast: Secondary | ICD-10-CM | POA: Diagnosis not present

## 2023-09-08 DIAGNOSIS — Z170 Estrogen receptor positive status [ER+]: Secondary | ICD-10-CM | POA: Diagnosis not present

## 2023-09-09 ENCOUNTER — Telehealth: Payer: Self-pay | Admitting: *Deleted

## 2023-09-09 NOTE — Telephone Encounter (Signed)
 [employer] person -Janet Lynn and she is helping the patient to get the appropriate info. So that she can help the form that the pt. Needs for her to work from home. She wanted direct line [phone] and fax [phone]. I called [employer] and gave her the phone and fax .

## 2023-09-12 ENCOUNTER — Ambulatory Visit
Admission: RE | Admit: 2023-09-12 | Discharge: 2023-09-12 | Disposition: A | Discharge status: Home / Self Care | Source: Ambulatory Visit | Attending: Radiation Oncology | Admitting: Radiation Oncology

## 2023-09-12 ENCOUNTER — Other Ambulatory Visit: Payer: Self-pay

## 2023-09-12 DIAGNOSIS — C50911 Malignant neoplasm of unspecified site of right female breast: Secondary | ICD-10-CM | POA: Diagnosis not present

## 2023-09-12 DIAGNOSIS — Z510 Encounter for antineoplastic radiation therapy: Secondary | ICD-10-CM | POA: Diagnosis not present

## 2023-09-12 DIAGNOSIS — Z170 Estrogen receptor positive status [ER+]: Secondary | ICD-10-CM | POA: Diagnosis not present

## 2023-09-12 LAB — RAD ONC ARIA SESSION SUMMARY
Course Elapsed Days: 0
Plan Fractions Treated to Date: 1
Plan Prescribed Dose Per Fraction: 2.66 Gy
Plan Total Fractions Prescribed: 16
Plan Total Prescribed Dose: 42.56 Gy
Reference Point Dosage Given to Date: 2.66 Gy
Reference Point Session Dosage Given: 2.66 Gy
Session Number: 1

## 2023-09-13 ENCOUNTER — Ambulatory Visit
Admission: RE | Admit: 2023-09-13 | Discharge: 2023-09-13 | Disposition: A | Discharge status: Home / Self Care | Source: Ambulatory Visit | Attending: Radiation Oncology | Admitting: Radiation Oncology

## 2023-09-13 ENCOUNTER — Other Ambulatory Visit: Payer: Self-pay

## 2023-09-13 DIAGNOSIS — Z510 Encounter for antineoplastic radiation therapy: Secondary | ICD-10-CM | POA: Diagnosis not present

## 2023-09-13 DIAGNOSIS — C50911 Malignant neoplasm of unspecified site of right female breast: Secondary | ICD-10-CM | POA: Diagnosis not present

## 2023-09-13 DIAGNOSIS — Z170 Estrogen receptor positive status [ER+]: Secondary | ICD-10-CM | POA: Diagnosis not present

## 2023-09-13 LAB — RAD ONC ARIA SESSION SUMMARY
Course Elapsed Days: 1
Plan Fractions Treated to Date: 2
Plan Prescribed Dose Per Fraction: 2.66 Gy
Plan Total Fractions Prescribed: 16
Plan Total Prescribed Dose: 42.56 Gy
Reference Point Dosage Given to Date: 5.32 Gy
Reference Point Session Dosage Given: 2.66 Gy
Session Number: 2

## 2023-09-14 ENCOUNTER — Encounter: Payer: Self-pay | Admitting: *Deleted

## 2023-09-14 ENCOUNTER — Other Ambulatory Visit: Payer: Self-pay

## 2023-09-14 ENCOUNTER — Ambulatory Visit
Admission: RE | Admit: 2023-09-14 | Discharge: 2023-09-14 | Disposition: A | Discharge status: Home / Self Care | Source: Ambulatory Visit | Attending: Radiation Oncology | Admitting: Radiation Oncology

## 2023-09-14 DIAGNOSIS — C50911 Malignant neoplasm of unspecified site of right female breast: Secondary | ICD-10-CM | POA: Diagnosis not present

## 2023-09-14 DIAGNOSIS — Z510 Encounter for antineoplastic radiation therapy: Secondary | ICD-10-CM | POA: Diagnosis not present

## 2023-09-14 DIAGNOSIS — Z170 Estrogen receptor positive status [ER+]: Secondary | ICD-10-CM | POA: Diagnosis not present

## 2023-09-14 LAB — RAD ONC ARIA SESSION SUMMARY
Course Elapsed Days: 2
Plan Fractions Treated to Date: 3
Plan Prescribed Dose Per Fraction: 2.66 Gy
Plan Total Fractions Prescribed: 16
Plan Total Prescribed Dose: 42.56 Gy
Reference Point Dosage Given to Date: 7.98 Gy
Reference Point Session Dosage Given: 2.66 Gy
Session Number: 3

## 2023-09-15 ENCOUNTER — Other Ambulatory Visit: Payer: Self-pay

## 2023-09-15 ENCOUNTER — Inpatient Hospital Stay

## 2023-09-15 ENCOUNTER — Ambulatory Visit
Admission: RE | Admit: 2023-09-15 | Discharge: 2023-09-15 | Disposition: A | Discharge status: Home / Self Care | Source: Ambulatory Visit | Attending: Radiation Oncology | Admitting: Radiation Oncology

## 2023-09-15 DIAGNOSIS — Z170 Estrogen receptor positive status [ER+]: Secondary | ICD-10-CM | POA: Diagnosis not present

## 2023-09-15 DIAGNOSIS — C50911 Malignant neoplasm of unspecified site of right female breast: Secondary | ICD-10-CM | POA: Diagnosis not present

## 2023-09-15 DIAGNOSIS — Z510 Encounter for antineoplastic radiation therapy: Secondary | ICD-10-CM | POA: Diagnosis not present

## 2023-09-15 LAB — RAD ONC ARIA SESSION SUMMARY
Course Elapsed Days: 3
Plan Fractions Treated to Date: 4
Plan Prescribed Dose Per Fraction: 2.66 Gy
Plan Total Fractions Prescribed: 16
Plan Total Prescribed Dose: 42.56 Gy
Reference Point Dosage Given to Date: 10.64 Gy
Reference Point Session Dosage Given: 2.66 Gy
Session Number: 4

## 2023-09-15 LAB — CBC (CANCER CENTER ONLY)
HCT: 42.5 % (ref 36.0–46.0)
Hemoglobin: 13.8 g/dL (ref 12.0–15.0)
MCH: 29.2 pg (ref 26.0–34.0)
MCHC: 32.5 g/dL (ref 30.0–36.0)
MCV: 89.9 fL (ref 80.0–100.0)
Platelet Count: 400 10*3/uL (ref 150–400)
RBC: 4.73 MIL/uL (ref 3.87–5.11)
RDW: 12.9 % (ref 11.5–15.5)
WBC Count: 10.3 10*3/uL (ref 4.0–10.5)
nRBC: 0 % (ref 0.0–0.2)

## 2023-09-16 ENCOUNTER — Ambulatory Visit
Admission: RE | Admit: 2023-09-16 | Discharge: 2023-09-16 | Disposition: A | Discharge status: Home / Self Care | Source: Ambulatory Visit | Attending: Radiation Oncology | Admitting: Radiation Oncology

## 2023-09-16 ENCOUNTER — Other Ambulatory Visit: Payer: Self-pay

## 2023-09-16 DIAGNOSIS — C50911 Malignant neoplasm of unspecified site of right female breast: Secondary | ICD-10-CM | POA: Diagnosis not present

## 2023-09-16 DIAGNOSIS — Z510 Encounter for antineoplastic radiation therapy: Secondary | ICD-10-CM | POA: Diagnosis not present

## 2023-09-16 DIAGNOSIS — Z170 Estrogen receptor positive status [ER+]: Secondary | ICD-10-CM | POA: Diagnosis not present

## 2023-09-16 LAB — RAD ONC ARIA SESSION SUMMARY
Course Elapsed Days: 4
Plan Fractions Treated to Date: 5
Plan Prescribed Dose Per Fraction: 2.66 Gy
Plan Total Fractions Prescribed: 16
Plan Total Prescribed Dose: 42.56 Gy
Reference Point Dosage Given to Date: 13.3 Gy
Reference Point Session Dosage Given: 2.66 Gy
Session Number: 5

## 2023-09-19 ENCOUNTER — Other Ambulatory Visit: Payer: Self-pay

## 2023-09-19 ENCOUNTER — Ambulatory Visit
Admission: RE | Admit: 2023-09-19 | Discharge: 2023-09-19 | Disposition: A | Discharge status: Home / Self Care | Source: Ambulatory Visit | Attending: Radiation Oncology | Admitting: Radiation Oncology

## 2023-09-19 DIAGNOSIS — C50911 Malignant neoplasm of unspecified site of right female breast: Secondary | ICD-10-CM | POA: Diagnosis not present

## 2023-09-19 DIAGNOSIS — Z510 Encounter for antineoplastic radiation therapy: Secondary | ICD-10-CM | POA: Diagnosis not present

## 2023-09-19 DIAGNOSIS — Z170 Estrogen receptor positive status [ER+]: Secondary | ICD-10-CM | POA: Diagnosis not present

## 2023-09-19 LAB — RAD ONC ARIA SESSION SUMMARY
Course Elapsed Days: 7
Plan Fractions Treated to Date: 6
Plan Prescribed Dose Per Fraction: 2.66 Gy
Plan Total Fractions Prescribed: 16
Plan Total Prescribed Dose: 42.56 Gy
Reference Point Dosage Given to Date: 15.96 Gy
Reference Point Session Dosage Given: 2.66 Gy
Session Number: 6

## 2023-09-20 ENCOUNTER — Other Ambulatory Visit: Payer: Self-pay

## 2023-09-20 ENCOUNTER — Ambulatory Visit
Admission: RE | Admit: 2023-09-20 | Discharge: 2023-09-20 | Disposition: A | Discharge status: Home / Self Care | Source: Ambulatory Visit | Attending: Radiation Oncology | Admitting: Radiation Oncology

## 2023-09-20 DIAGNOSIS — C50911 Malignant neoplasm of unspecified site of right female breast: Secondary | ICD-10-CM | POA: Diagnosis not present

## 2023-09-20 DIAGNOSIS — Z170 Estrogen receptor positive status [ER+]: Secondary | ICD-10-CM | POA: Diagnosis not present

## 2023-09-20 DIAGNOSIS — Z510 Encounter for antineoplastic radiation therapy: Secondary | ICD-10-CM | POA: Diagnosis not present

## 2023-09-20 LAB — RAD ONC ARIA SESSION SUMMARY
Course Elapsed Days: 8
Plan Fractions Treated to Date: 7
Plan Prescribed Dose Per Fraction: 2.66 Gy
Plan Total Fractions Prescribed: 16
Plan Total Prescribed Dose: 42.56 Gy
Reference Point Dosage Given to Date: 18.62 Gy
Reference Point Session Dosage Given: 2.66 Gy
Session Number: 7

## 2023-09-21 ENCOUNTER — Ambulatory Visit
Admission: RE | Admit: 2023-09-21 | Discharge: 2023-09-21 | Disposition: A | Discharge status: Home / Self Care | Source: Ambulatory Visit | Attending: Radiation Oncology | Admitting: Radiation Oncology

## 2023-09-21 ENCOUNTER — Other Ambulatory Visit: Payer: Self-pay

## 2023-09-21 DIAGNOSIS — C50911 Malignant neoplasm of unspecified site of right female breast: Secondary | ICD-10-CM | POA: Diagnosis not present

## 2023-09-21 DIAGNOSIS — Z510 Encounter for antineoplastic radiation therapy: Secondary | ICD-10-CM | POA: Diagnosis not present

## 2023-09-21 DIAGNOSIS — Z170 Estrogen receptor positive status [ER+]: Secondary | ICD-10-CM | POA: Diagnosis not present

## 2023-09-21 LAB — RAD ONC ARIA SESSION SUMMARY
Course Elapsed Days: 9
Plan Fractions Treated to Date: 8
Plan Prescribed Dose Per Fraction: 2.66 Gy
Plan Total Fractions Prescribed: 16
Plan Total Prescribed Dose: 42.56 Gy
Reference Point Dosage Given to Date: 21.28 Gy
Reference Point Session Dosage Given: 2.66 Gy
Session Number: 8

## 2023-09-22 ENCOUNTER — Ambulatory Visit
Admission: RE | Admit: 2023-09-22 | Discharge: 2023-09-22 | Disposition: A | Discharge status: Home / Self Care | Source: Ambulatory Visit | Attending: Radiation Oncology | Admitting: Radiation Oncology

## 2023-09-22 ENCOUNTER — Other Ambulatory Visit: Payer: Self-pay

## 2023-09-22 DIAGNOSIS — Z170 Estrogen receptor positive status [ER+]: Secondary | ICD-10-CM | POA: Diagnosis not present

## 2023-09-22 DIAGNOSIS — Z510 Encounter for antineoplastic radiation therapy: Secondary | ICD-10-CM | POA: Diagnosis not present

## 2023-09-22 DIAGNOSIS — C50911 Malignant neoplasm of unspecified site of right female breast: Secondary | ICD-10-CM | POA: Diagnosis not present

## 2023-09-22 LAB — RAD ONC ARIA SESSION SUMMARY
Course Elapsed Days: 10
Plan Fractions Treated to Date: 9
Plan Prescribed Dose Per Fraction: 2.66 Gy
Plan Total Fractions Prescribed: 16
Plan Total Prescribed Dose: 42.56 Gy
Reference Point Dosage Given to Date: 23.94 Gy
Reference Point Session Dosage Given: 2.66 Gy
Session Number: 9

## 2023-09-23 ENCOUNTER — Other Ambulatory Visit: Payer: Self-pay

## 2023-09-23 ENCOUNTER — Ambulatory Visit
Admission: RE | Admit: 2023-09-23 | Discharge: 2023-09-23 | Disposition: A | Discharge status: Home / Self Care | Source: Ambulatory Visit | Attending: Radiation Oncology | Admitting: Radiation Oncology

## 2023-09-23 DIAGNOSIS — Z170 Estrogen receptor positive status [ER+]: Secondary | ICD-10-CM | POA: Diagnosis not present

## 2023-09-23 DIAGNOSIS — Z510 Encounter for antineoplastic radiation therapy: Secondary | ICD-10-CM | POA: Diagnosis not present

## 2023-09-23 DIAGNOSIS — C50911 Malignant neoplasm of unspecified site of right female breast: Secondary | ICD-10-CM | POA: Diagnosis not present

## 2023-09-23 LAB — RAD ONC ARIA SESSION SUMMARY
Course Elapsed Days: 11
Plan Fractions Treated to Date: 10
Plan Prescribed Dose Per Fraction: 2.66 Gy
Plan Total Fractions Prescribed: 16
Plan Total Prescribed Dose: 42.56 Gy
Reference Point Dosage Given to Date: 26.6 Gy
Reference Point Session Dosage Given: 2.66 Gy
Session Number: 10

## 2023-09-26 ENCOUNTER — Ambulatory Visit
Admission: RE | Admit: 2023-09-26 | Discharge: 2023-09-26 | Disposition: A | Discharge status: Home / Self Care | Source: Ambulatory Visit | Attending: Radiation Oncology | Admitting: Radiation Oncology

## 2023-09-26 ENCOUNTER — Other Ambulatory Visit: Payer: Self-pay

## 2023-09-26 DIAGNOSIS — Z510 Encounter for antineoplastic radiation therapy: Secondary | ICD-10-CM | POA: Diagnosis not present

## 2023-09-26 DIAGNOSIS — C50911 Malignant neoplasm of unspecified site of right female breast: Secondary | ICD-10-CM | POA: Diagnosis not present

## 2023-09-26 DIAGNOSIS — C50411 Malignant neoplasm of upper-outer quadrant of right female breast: Secondary | ICD-10-CM | POA: Diagnosis not present

## 2023-09-26 DIAGNOSIS — Z170 Estrogen receptor positive status [ER+]: Secondary | ICD-10-CM | POA: Diagnosis not present

## 2023-09-26 LAB — RAD ONC ARIA SESSION SUMMARY
Course Elapsed Days: 14
Plan Fractions Treated to Date: 11
Plan Prescribed Dose Per Fraction: 2.66 Gy
Plan Total Fractions Prescribed: 16
Plan Total Prescribed Dose: 42.56 Gy
Reference Point Dosage Given to Date: 29.26 Gy
Reference Point Session Dosage Given: 2.66 Gy
Session Number: 11

## 2023-09-27 ENCOUNTER — Ambulatory Visit
Admission: RE | Admit: 2023-09-27 | Discharge: 2023-09-27 | Disposition: A | Discharge status: Home / Self Care | Source: Ambulatory Visit | Attending: Radiation Oncology | Admitting: Radiation Oncology

## 2023-09-27 ENCOUNTER — Other Ambulatory Visit: Payer: Self-pay

## 2023-09-27 DIAGNOSIS — Z510 Encounter for antineoplastic radiation therapy: Secondary | ICD-10-CM | POA: Diagnosis not present

## 2023-09-27 DIAGNOSIS — Z170 Estrogen receptor positive status [ER+]: Secondary | ICD-10-CM | POA: Diagnosis not present

## 2023-09-27 DIAGNOSIS — C50911 Malignant neoplasm of unspecified site of right female breast: Secondary | ICD-10-CM | POA: Diagnosis not present

## 2023-09-27 LAB — RAD ONC ARIA SESSION SUMMARY
Course Elapsed Days: 15
Plan Fractions Treated to Date: 12
Plan Prescribed Dose Per Fraction: 2.66 Gy
Plan Total Fractions Prescribed: 16
Plan Total Prescribed Dose: 42.56 Gy
Reference Point Dosage Given to Date: 31.92 Gy
Reference Point Session Dosage Given: 2.66 Gy
Session Number: 12

## 2023-09-28 ENCOUNTER — Other Ambulatory Visit: Payer: Self-pay

## 2023-09-28 ENCOUNTER — Ambulatory Visit
Admission: RE | Admit: 2023-09-28 | Discharge: 2023-09-28 | Disposition: A | Discharge status: Home / Self Care | Source: Ambulatory Visit | Attending: Radiation Oncology | Admitting: Radiation Oncology

## 2023-09-28 DIAGNOSIS — C50911 Malignant neoplasm of unspecified site of right female breast: Secondary | ICD-10-CM | POA: Diagnosis not present

## 2023-09-28 DIAGNOSIS — Z510 Encounter for antineoplastic radiation therapy: Secondary | ICD-10-CM | POA: Diagnosis not present

## 2023-09-28 DIAGNOSIS — Z170 Estrogen receptor positive status [ER+]: Secondary | ICD-10-CM | POA: Diagnosis not present

## 2023-09-28 LAB — RAD ONC ARIA SESSION SUMMARY
Course Elapsed Days: 16
Plan Fractions Treated to Date: 13
Plan Prescribed Dose Per Fraction: 2.66 Gy
Plan Total Fractions Prescribed: 16
Plan Total Prescribed Dose: 42.56 Gy
Reference Point Dosage Given to Date: 34.58 Gy
Reference Point Session Dosage Given: 2.66 Gy
Session Number: 13

## 2023-09-29 ENCOUNTER — Other Ambulatory Visit: Payer: Self-pay

## 2023-09-29 ENCOUNTER — Ambulatory Visit
Admission: RE | Admit: 2023-09-29 | Discharge: 2023-09-29 | Disposition: A | Discharge status: Home / Self Care | Source: Ambulatory Visit | Attending: Radiation Oncology | Admitting: Radiation Oncology

## 2023-09-29 ENCOUNTER — Inpatient Hospital Stay: Attending: Internal Medicine

## 2023-09-29 DIAGNOSIS — Z1732 Human epidermal growth factor receptor 2 negative status: Secondary | ICD-10-CM | POA: Insufficient documentation

## 2023-09-29 DIAGNOSIS — C50811 Malignant neoplasm of overlapping sites of right female breast: Secondary | ICD-10-CM | POA: Insufficient documentation

## 2023-09-29 DIAGNOSIS — Z8249 Family history of ischemic heart disease and other diseases of the circulatory system: Secondary | ICD-10-CM | POA: Insufficient documentation

## 2023-09-29 DIAGNOSIS — Z510 Encounter for antineoplastic radiation therapy: Secondary | ICD-10-CM | POA: Diagnosis not present

## 2023-09-29 DIAGNOSIS — Z8379 Family history of other diseases of the digestive system: Secondary | ICD-10-CM | POA: Insufficient documentation

## 2023-09-29 DIAGNOSIS — Z833 Family history of diabetes mellitus: Secondary | ICD-10-CM | POA: Insufficient documentation

## 2023-09-29 DIAGNOSIS — Z1721 Progesterone receptor positive status: Secondary | ICD-10-CM | POA: Insufficient documentation

## 2023-09-29 DIAGNOSIS — Z170 Estrogen receptor positive status [ER+]: Secondary | ICD-10-CM | POA: Insufficient documentation

## 2023-09-29 DIAGNOSIS — C50911 Malignant neoplasm of unspecified site of right female breast: Secondary | ICD-10-CM | POA: Insufficient documentation

## 2023-09-29 DIAGNOSIS — Z800 Family history of malignant neoplasm of digestive organs: Secondary | ICD-10-CM | POA: Insufficient documentation

## 2023-09-29 DIAGNOSIS — Z79811 Long term (current) use of aromatase inhibitors: Secondary | ICD-10-CM | POA: Insufficient documentation

## 2023-09-29 DIAGNOSIS — Z9089 Acquired absence of other organs: Secondary | ICD-10-CM | POA: Insufficient documentation

## 2023-09-29 DIAGNOSIS — Z79899 Other long term (current) drug therapy: Secondary | ICD-10-CM | POA: Insufficient documentation

## 2023-09-29 DIAGNOSIS — Z881 Allergy status to other antibiotic agents status: Secondary | ICD-10-CM | POA: Insufficient documentation

## 2023-09-29 DIAGNOSIS — Z9049 Acquired absence of other specified parts of digestive tract: Secondary | ICD-10-CM | POA: Insufficient documentation

## 2023-09-29 DIAGNOSIS — Z803 Family history of malignant neoplasm of breast: Secondary | ICD-10-CM | POA: Insufficient documentation

## 2023-09-29 LAB — RAD ONC ARIA SESSION SUMMARY
Course Elapsed Days: 17
Plan Fractions Treated to Date: 14
Plan Prescribed Dose Per Fraction: 2.66 Gy
Plan Total Fractions Prescribed: 16
Plan Total Prescribed Dose: 42.56 Gy
Reference Point Dosage Given to Date: 37.24 Gy
Reference Point Session Dosage Given: 2.66 Gy
Session Number: 14

## 2023-09-29 LAB — CBC (CANCER CENTER ONLY)
HCT: 40.8 % (ref 36.0–46.0)
Hemoglobin: 13.3 g/dL (ref 12.0–15.0)
MCH: 29.4 pg (ref 26.0–34.0)
MCHC: 32.6 g/dL (ref 30.0–36.0)
MCV: 90.1 fL (ref 80.0–100.0)
Platelet Count: 362 10*3/uL (ref 150–400)
RBC: 4.53 MIL/uL (ref 3.87–5.11)
RDW: 13 % (ref 11.5–15.5)
WBC Count: 9.5 10*3/uL (ref 4.0–10.5)
nRBC: 0 % (ref 0.0–0.2)

## 2023-09-30 ENCOUNTER — Ambulatory Visit
Admission: RE | Admit: 2023-09-30 | Discharge: 2023-09-30 | Disposition: A | Discharge status: Home / Self Care | Source: Ambulatory Visit | Attending: Radiation Oncology | Admitting: Radiation Oncology

## 2023-09-30 ENCOUNTER — Other Ambulatory Visit: Payer: Self-pay

## 2023-09-30 DIAGNOSIS — Z170 Estrogen receptor positive status [ER+]: Secondary | ICD-10-CM | POA: Diagnosis not present

## 2023-09-30 DIAGNOSIS — C50911 Malignant neoplasm of unspecified site of right female breast: Secondary | ICD-10-CM | POA: Diagnosis not present

## 2023-09-30 DIAGNOSIS — Z510 Encounter for antineoplastic radiation therapy: Secondary | ICD-10-CM | POA: Diagnosis not present

## 2023-09-30 LAB — RAD ONC ARIA SESSION SUMMARY
Course Elapsed Days: 18
Plan Fractions Treated to Date: 15
Plan Prescribed Dose Per Fraction: 2.66 Gy
Plan Total Fractions Prescribed: 16
Plan Total Prescribed Dose: 42.56 Gy
Reference Point Dosage Given to Date: 39.9 Gy
Reference Point Session Dosage Given: 2.66 Gy
Session Number: 15

## 2023-10-03 ENCOUNTER — Other Ambulatory Visit: Payer: Self-pay

## 2023-10-03 ENCOUNTER — Ambulatory Visit
Admission: RE | Admit: 2023-10-03 | Discharge: 2023-10-03 | Disposition: A | Discharge status: Home / Self Care | Source: Ambulatory Visit | Attending: Radiation Oncology | Admitting: Radiation Oncology

## 2023-10-03 DIAGNOSIS — Z510 Encounter for antineoplastic radiation therapy: Secondary | ICD-10-CM | POA: Diagnosis not present

## 2023-10-03 DIAGNOSIS — Z170 Estrogen receptor positive status [ER+]: Secondary | ICD-10-CM | POA: Diagnosis not present

## 2023-10-03 DIAGNOSIS — C50911 Malignant neoplasm of unspecified site of right female breast: Secondary | ICD-10-CM | POA: Diagnosis not present

## 2023-10-03 LAB — RAD ONC ARIA SESSION SUMMARY
Course Elapsed Days: 21
Plan Fractions Treated to Date: 16
Plan Prescribed Dose Per Fraction: 2.66 Gy
Plan Total Fractions Prescribed: 16
Plan Total Prescribed Dose: 42.56 Gy
Reference Point Dosage Given to Date: 42.56 Gy
Reference Point Session Dosage Given: 2.66 Gy
Session Number: 16

## 2023-10-04 ENCOUNTER — Ambulatory Visit
Admission: RE | Admit: 2023-10-04 | Discharge: 2023-10-04 | Disposition: A | Discharge status: Home / Self Care | Source: Ambulatory Visit | Attending: Radiation Oncology | Admitting: Radiation Oncology

## 2023-10-04 ENCOUNTER — Other Ambulatory Visit: Payer: Self-pay

## 2023-10-04 DIAGNOSIS — Z510 Encounter for antineoplastic radiation therapy: Secondary | ICD-10-CM | POA: Diagnosis not present

## 2023-10-04 DIAGNOSIS — Z170 Estrogen receptor positive status [ER+]: Secondary | ICD-10-CM | POA: Diagnosis not present

## 2023-10-04 DIAGNOSIS — C50911 Malignant neoplasm of unspecified site of right female breast: Secondary | ICD-10-CM | POA: Diagnosis not present

## 2023-10-04 LAB — RAD ONC ARIA SESSION SUMMARY
Course Elapsed Days: 22
Plan Fractions Treated to Date: 1
Plan Prescribed Dose Per Fraction: 2 Gy
Plan Total Fractions Prescribed: 5
Plan Total Prescribed Dose: 10 Gy
Reference Point Dosage Given to Date: 2 Gy
Reference Point Session Dosage Given: 2 Gy
Session Number: 17

## 2023-10-05 ENCOUNTER — Ambulatory Visit
Admission: RE | Admit: 2023-10-05 | Discharge: 2023-10-05 | Disposition: A | Discharge status: Home / Self Care | Source: Ambulatory Visit | Attending: Radiation Oncology | Admitting: Radiation Oncology

## 2023-10-05 ENCOUNTER — Other Ambulatory Visit: Payer: Self-pay

## 2023-10-05 DIAGNOSIS — Z170 Estrogen receptor positive status [ER+]: Secondary | ICD-10-CM | POA: Diagnosis not present

## 2023-10-05 DIAGNOSIS — Z510 Encounter for antineoplastic radiation therapy: Secondary | ICD-10-CM | POA: Diagnosis not present

## 2023-10-05 DIAGNOSIS — C50911 Malignant neoplasm of unspecified site of right female breast: Secondary | ICD-10-CM | POA: Diagnosis not present

## 2023-10-05 LAB — RAD ONC ARIA SESSION SUMMARY
Course Elapsed Days: 23
Plan Fractions Treated to Date: 2
Plan Prescribed Dose Per Fraction: 2 Gy
Plan Total Fractions Prescribed: 5
Plan Total Prescribed Dose: 10 Gy
Reference Point Dosage Given to Date: 4 Gy
Reference Point Session Dosage Given: 2 Gy
Session Number: 18

## 2023-10-06 ENCOUNTER — Ambulatory Visit
Admission: RE | Admit: 2023-10-06 | Discharge: 2023-10-06 | Disposition: A | Discharge status: Home / Self Care | Source: Ambulatory Visit | Attending: Radiation Oncology | Admitting: Radiation Oncology

## 2023-10-06 ENCOUNTER — Other Ambulatory Visit: Payer: Self-pay

## 2023-10-06 DIAGNOSIS — Z510 Encounter for antineoplastic radiation therapy: Secondary | ICD-10-CM | POA: Diagnosis not present

## 2023-10-06 DIAGNOSIS — C50911 Malignant neoplasm of unspecified site of right female breast: Secondary | ICD-10-CM | POA: Diagnosis not present

## 2023-10-06 DIAGNOSIS — Z170 Estrogen receptor positive status [ER+]: Secondary | ICD-10-CM | POA: Diagnosis not present

## 2023-10-06 LAB — RAD ONC ARIA SESSION SUMMARY
Course Elapsed Days: 24
Plan Fractions Treated to Date: 3
Plan Prescribed Dose Per Fraction: 2 Gy
Plan Total Fractions Prescribed: 5
Plan Total Prescribed Dose: 10 Gy
Reference Point Dosage Given to Date: 6 Gy
Reference Point Session Dosage Given: 2 Gy
Session Number: 19

## 2023-10-07 ENCOUNTER — Other Ambulatory Visit: Payer: Self-pay

## 2023-10-07 ENCOUNTER — Ambulatory Visit
Admission: RE | Admit: 2023-10-07 | Discharge: 2023-10-07 | Disposition: A | Discharge status: Home / Self Care | Source: Ambulatory Visit | Attending: Radiation Oncology | Admitting: Radiation Oncology

## 2023-10-07 DIAGNOSIS — Z510 Encounter for antineoplastic radiation therapy: Secondary | ICD-10-CM | POA: Diagnosis not present

## 2023-10-07 DIAGNOSIS — Z170 Estrogen receptor positive status [ER+]: Secondary | ICD-10-CM | POA: Diagnosis not present

## 2023-10-07 DIAGNOSIS — C50911 Malignant neoplasm of unspecified site of right female breast: Secondary | ICD-10-CM | POA: Diagnosis not present

## 2023-10-07 LAB — RAD ONC ARIA SESSION SUMMARY
Course Elapsed Days: 25
Plan Fractions Treated to Date: 4
Plan Prescribed Dose Per Fraction: 2 Gy
Plan Total Fractions Prescribed: 5
Plan Total Prescribed Dose: 10 Gy
Reference Point Dosage Given to Date: 8 Gy
Reference Point Session Dosage Given: 2 Gy
Session Number: 20

## 2023-10-10 ENCOUNTER — Other Ambulatory Visit: Payer: Self-pay

## 2023-10-10 ENCOUNTER — Encounter: Payer: Self-pay | Admitting: *Deleted

## 2023-10-10 ENCOUNTER — Ambulatory Visit
Admission: RE | Admit: 2023-10-10 | Discharge: 2023-10-10 | Disposition: A | Discharge status: Home / Self Care | Source: Ambulatory Visit | Attending: Radiation Oncology | Admitting: Radiation Oncology

## 2023-10-10 DIAGNOSIS — C50911 Malignant neoplasm of unspecified site of right female breast: Secondary | ICD-10-CM | POA: Diagnosis not present

## 2023-10-10 DIAGNOSIS — Z510 Encounter for antineoplastic radiation therapy: Secondary | ICD-10-CM | POA: Diagnosis not present

## 2023-10-10 DIAGNOSIS — Z170 Estrogen receptor positive status [ER+]: Secondary | ICD-10-CM | POA: Diagnosis not present

## 2023-10-10 LAB — RAD ONC ARIA SESSION SUMMARY
Course Elapsed Days: 28
Plan Fractions Treated to Date: 5
Plan Prescribed Dose Per Fraction: 2 Gy
Plan Total Fractions Prescribed: 5
Plan Total Prescribed Dose: 10 Gy
Reference Point Dosage Given to Date: 10 Gy
Reference Point Session Dosage Given: 2 Gy
Session Number: 21

## 2023-10-11 NOTE — Radiation Completion Notes (Signed)
 Patient Name: Janet Lynn, Janet Lynn MRN: [id number] Date of Birth: 1971-01-30 Referring Physician: Richrd Char, M.D. Date of Service: 2023-10-11 Radiation Oncologist: Glenis Langdon, M.D. [facility] [place]                             RADIATION ONCOLOGY END OF TREATMENT NOTE     Diagnosis: C50.911 Malignant neoplasm of unspecified site of right female breast Staging on 2023-06-27: Breast cancer (HCC) T=cT2, N=cN0, M=cM0 Intent: Curative     HPI: Patient is a 54 year old female who presents with an abnormal Mammogram of her right breast.  There was a 2 cm mass at the 9 o'clock position 6 cm from the nipple.  No abnormal adenopathy was noted in her right axilla on ultrasound examination.  She underwent stereotactic biopsy which was positive for invasive mammary carcinoma.  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  1 sentinel lymph node was negative for metastatic disease.  There were multiple positive margins and she underwent reexcision with clear margins.  Her breast is rather sore at this time she is only been out of surgery for the reexcision 1 week.  She otherwise specifically denies cough or bone pain.  Her tumor is ER/PR positive HER2/neu not overexpressed.  Her Oncotype DX came back at 65 and she will not have chemotherapy.  She will be a candidate after radiation for endocrine therapy.      ==========DELIVERED PLANS==========  First Treatment Date: 2023-09-12 Last Treatment Date: 2023-10-10   Plan Name: Breast_R Site: Breast, Right Technique: 3D Mode: Photon Dose Per Fraction: 2.66 Gy Prescribed Dose (Delivered / Prescribed): 42.56 Gy / 42.56 Gy Prescribed Fxs (Delivered / Prescribed): 16 / 16   Plan Name: Breast_R_Bst Site: Breast, Right Technique: 3D Mode: Photon Dose Per Fraction: 2 Gy Prescribed Dose (Delivered / Prescribed): 10 Gy / 10 Gy Prescribed Fxs (Delivered / Prescribed): 5 /  5     ==========ON TREATMENT VISIT DATES========== 2023-09-13, 2023-09-20, 2023-09-27, 2023-10-04     ==========UPCOMING VISITS========== 11/07/2023 [facility] RAD ONCOLOGY FOLLOW UP 30 Glenis Langdon, MD  10/31/2023 [facility] MED ONC SOZO SCREEN Waverly Hageman, RN  10/21/2023 [facility] ON[facility] EST PT 15 Timmy Forbes, MD  10/21/2023 [facility] ONC LAB [facility]        ==========APPENDIX - ON TREATMENT VISIT NOTES==========   See weekly On Treatment Notes in Epic for details in the Media tab (listed as Progress notes on the On Treatment Visit Dates listed above).

## 2023-10-21 ENCOUNTER — Encounter: Payer: Self-pay | Admitting: Oncology

## 2023-10-21 ENCOUNTER — Inpatient Hospital Stay: Admitting: Oncology

## 2023-10-21 ENCOUNTER — Inpatient Hospital Stay

## 2023-10-21 VITALS — BP 150/87 | HR 73 | Temp 98.0°F | Resp 18 | Wt 210.0 lb

## 2023-10-21 DIAGNOSIS — Z9049 Acquired absence of other specified parts of digestive tract: Secondary | ICD-10-CM | POA: Insufficient documentation

## 2023-10-21 DIAGNOSIS — Z1732 Human epidermal growth factor receptor 2 negative status: Secondary | ICD-10-CM | POA: Insufficient documentation

## 2023-10-21 DIAGNOSIS — Z833 Family history of diabetes mellitus: Secondary | ICD-10-CM | POA: Insufficient documentation

## 2023-10-21 DIAGNOSIS — Z1721 Progesterone receptor positive status: Secondary | ICD-10-CM | POA: Insufficient documentation

## 2023-10-21 DIAGNOSIS — Z881 Allergy status to other antibiotic agents status: Secondary | ICD-10-CM | POA: Diagnosis not present

## 2023-10-21 DIAGNOSIS — Z170 Estrogen receptor positive status [ER+]: Secondary | ICD-10-CM | POA: Diagnosis not present

## 2023-10-21 DIAGNOSIS — Z803 Family history of malignant neoplasm of breast: Secondary | ICD-10-CM | POA: Insufficient documentation

## 2023-10-21 DIAGNOSIS — Z79899 Other long term (current) drug therapy: Secondary | ICD-10-CM | POA: Insufficient documentation

## 2023-10-21 DIAGNOSIS — C50911 Malignant neoplasm of unspecified site of right female breast: Secondary | ICD-10-CM

## 2023-10-21 DIAGNOSIS — Z9089 Acquired absence of other organs: Secondary | ICD-10-CM | POA: Diagnosis not present

## 2023-10-21 DIAGNOSIS — Z79811 Long term (current) use of aromatase inhibitors: Secondary | ICD-10-CM | POA: Insufficient documentation

## 2023-10-21 DIAGNOSIS — C50811 Malignant neoplasm of overlapping sites of right female breast: Secondary | ICD-10-CM | POA: Insufficient documentation

## 2023-10-21 DIAGNOSIS — Z8379 Family history of other diseases of the digestive system: Secondary | ICD-10-CM | POA: Insufficient documentation

## 2023-10-21 DIAGNOSIS — Z800 Family history of malignant neoplasm of digestive organs: Secondary | ICD-10-CM | POA: Diagnosis not present

## 2023-10-21 DIAGNOSIS — Z8249 Family history of ischemic heart disease and other diseases of the circulatory system: Secondary | ICD-10-CM | POA: Insufficient documentation

## 2023-10-21 LAB — COMPREHENSIVE METABOLIC PANEL WITH GFR
ALT: 24 U/L (ref 0–44)
AST: 19 U/L (ref 15–41)
Albumin: 3.9 g/dL (ref 3.5–5.0)
Alkaline Phosphatase: 76 U/L (ref 38–126)
Anion gap: 9 (ref 5–15)
BUN: 17 mg/dL (ref 6–20)
CO2: 24 mmol/L (ref 22–32)
Calcium: 9.3 mg/dL (ref 8.9–10.3)
Chloride: 105 mmol/L (ref 98–111)
Creatinine, Ser: 0.86 mg/dL (ref 0.44–1.00)
GFR, Estimated: >60 mL/min (ref >60)
Glucose, Bld: 88 mg/dL (ref 70–99)
Potassium: 4.6 mmol/L (ref 3.5–5.1)
Sodium: 138 mmol/L (ref 135–145)
Total Bilirubin: 0.7 mg/dL (ref 0.0–1.2)
Total Protein: 6.9 g/dL (ref 6.5–8.1)

## 2023-10-21 LAB — CBC WITH DIFFERENTIAL/PLATELET
Abs Immature Granulocytes: 0.02 10*3/uL (ref 0.00–0.07)
Basophils Absolute: 0 10*3/uL (ref 0.0–0.1)
Basophils Relative: 1 %
Eosinophils Absolute: 0.4 10*3/uL (ref 0.0–0.5)
Eosinophils Relative: 4 %
HCT: 40.1 % (ref 36.0–46.0)
Hemoglobin: 13.6 g/dL (ref 12.0–15.0)
Immature Granulocytes: 0 %
Lymphocytes Relative: 24 %
Lymphs Abs: 2 10*3/uL (ref 0.7–4.0)
MCH: 29.8 pg (ref 26.0–34.0)
MCHC: 33.9 g/dL (ref 30.0–36.0)
MCV: 87.9 fL (ref 80.0–100.0)
Monocytes Absolute: 0.8 10*3/uL (ref 0.1–1.0)
Monocytes Relative: 9 %
Neutro Abs: 5.3 10*3/uL (ref 1.7–7.7)
Neutrophils Relative %: 62 %
Platelets: 338 10*3/uL (ref 150–400)
RBC: 4.56 MIL/uL (ref 3.87–5.11)
RDW: 12.9 % (ref 11.5–15.5)
WBC: 8.4 10*3/uL (ref 4.0–10.5)
nRBC: 0 % (ref 0.0–0.2)

## 2023-10-21 MED ORDER — LETROZOLE 2.5 MG PO TABS: 2.5000 mg | ORAL_TABLET | Freq: Every day | ORAL | 3 refills | Status: DC

## 2023-10-21 NOTE — Progress Notes (Signed)
 Patient would like to know about when and what type of hormone pill. Feels like for the past couple of weeks like she has a tickle in her throat that she has to keep clearing her throat.

## 2023-10-22 DIAGNOSIS — Z79811 Long term (current) use of aromatase inhibitors: Secondary | ICD-10-CM | POA: Insufficient documentation

## 2023-10-22 NOTE — Assessment & Plan Note (Addendum)
 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  Rationale of using aromatase inhibitor -letrozole, was   discussed with patient.  Side effects of letrozole, including but not limited to hot flash, muscle and joint pain, fatigue, mood swing, vaginal dryness/inching, loss of bone mineral density,hair thinning, heart problem, etc were discussed with patient.  Patient voices understanding and willing to proceed.

## 2023-10-22 NOTE — Assessment & Plan Note (Signed)
 Recommend DEXA Recommend calcium and vitamin D supplementation.

## 2023-10-22 NOTE — Progress Notes (Signed)
 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  Rationale of using aromatase inhibitor -letrozole, was   discussed with patient.  Side effects of letrozole, including but not limited to hot flash, muscle and joint pain, fatigue, mood swing, vaginal dryness/inching, loss of bone mineral density,hair thinning, heart problem, etc were discussed with patient.  Patient voices understanding and willing to proceed.    Aromatase inhibitor use Recommend DEXA Recommend calcium and vitamin D supplementation.    Orders Placed This Encounter  Procedures   DG Bone Density    Standing Status:   Future    Expected Date:   11/04/2023    Expiration Date:   10/20/2024    Reason for Exam (SYMPTOM  OR DIAGNOSIS REQUIRED):   breast cancer    Is patient pregnant?:   No    Preferred imaging location?:   [facility]   CBC with Differential (Cancer Center Only)    Standing Status:   Future    Expected Date:   01/21/2024    Expiration Date:   10/20/2024   CMP (Cancer Center only)    Standing Status:   Future    Expected Date:   01/21/2024    Expiration Date:   10/20/2024   Follow up in 3 months All questions were answered. The patient knows to call the clinic with any  problems, questions or concerns.  Timmy Forbes, MD, PhD [facility] Hematology Oncology 10/21/2023   HISTORY OF PRESENTING ILLNESS:   Janet Lynn is a  53 y.o.  female with PMH listed below was seen in consultation at the request of  Donnie Galea, MD  for evaluation of Stage IB Right breast IDC, ER PR positive, HER2 negative Patient previously followed up with Dr.Agrawal. Patient switched care to me on  Extensive medical record review was performed by me Oncology History  Breast cancer (HCC)  05/13/2023 Mammogram   Screening mammogram from 05/13/2023 Showed abnormal mass in the right breast.  Diagnostic mammogram bilateral (06/16/2023) FINDINGS: Additional tomograms were performed of the bilateral breast. There is a spiculated mass in the outer right breast measuring 1.7 cm. There is an oval circumscribed mass in the outer left breast measuring 0.7 cm.   Physical examination of the outer right breast reveals a firm masslike area of thickening at the approximate 8:30 to 9 o'clock position.   Targeted ultrasound of the right breast was performed. There is an irregular hypoechoic mass in the right breast at 9 o'clock 6 cm from nipple measuring 2 x 1.2 x 1.5 cm. This corresponds well with the mass seen in the right breast at mammography. No abnormal lymph nodes identified in the right axilla.   Targeted ultrasound of the left breast was performed. There is a cluster  of microcysts in the left breast at 3 o'clock 3 cm from nipple measuring 0.8 x 0.4 x 0.8 cm. This corresponds well with the mass seen in the outer left breast at mammography.   IMPRESSION: Suspicious 2 cm mass in the right breast at the 9 o'clock position.     06/21/2023 Pathology Results   FINAL DIAGNOSIS        1. Breast, right, needle core biopsy, 9:00 6cmfn coil clip :       -  INVASIVE MAMMARY CARCINOMA   The biopsy material shows an infiltrative proliferation of cells with large vesicular nuclei  with inconspicuous nucleoli, arranged linearly and in small clusters. Based on the biopsy, the carcinoma appears Nottingham grade 2 of 3 and measures 1.0 cm in greatest linear extent.   Results:  IMMUNOHISTOCHEMICAL AND MORPHOMETRIC ANALYSIS PERFORMED MANUALLY  The tumor cells are negative for Her2 (1+).  Estrogen Receptor:  95%, POSITIVE, STRONG STAINING INTENSITY  Progesterone Receptor:  100%, POSITIVE, STRONG STAINING INTENSITY  Proliferation Marker Ki67:  5%    06/27/2023 Cancer Staging   Staging form: Breast, AJCC 8th Edition - Clinical: Stage IB (cT2, cN0, cM0, G2, ER+, PR+, HER2-) - Signed by Agrawal, Kavita, MD on 06/27/2023 Stage prefix: Initial diagnosis Histologic grading system: 3 grade system   07/07/2023 Genetic Testing   Negative genetic testing on the BRCAPlus and CancerNext-Expanded+RNA panel  FH p.F225S (c.674T>C) VUS identified.  The report date is July 07, 2023.  The CancerNext-Expanded gene panel offered by [facility] and includes sequencing, rearrangement, and RNA analysis for the following 76 genes: AIP, ALK, APC, ATM, AXIN2, BAP1, BARD1, BMPR1A, BRCA1, BRCA2, BRIP1, CDC73, CDH1, CDK4, CDKN1B, CDKN2A, CEBPA, CHEK2, CTNNA1, DDX41, DICER1, ETV6, FH, FLCN, GATA2, LZTR1, MAX, MBD4, MEN1, MET, MLH1, MSH2, MSH3, MSH6, MUTYH, NF1, NF2, NTHL1, PALB2, PHOX2B, PMS2, POT1, PRKAR1A, PTCH1, PTEN, RAD51C, RAD51D, RB1, RET, RUNX1, SDHA, SDHAF2, SDHB, SDHC, SDHD, SMAD4, SMARCA4, SMARCB1, SMARCE1, STK11, SUFU, TMEM127, TP53, TSC1, TSC2, VHL, and WT1 (sequencing and deletion/duplication); EGFR, HOXB13, KIT, MITF, PDGFRA, POLD1, and POLE (sequencing only); EPCAM and GREM1 (deletion/duplication only).  The BRCAPlus gene panel offered by [facility] and includes sequencing and rearrangement analysis for the following 13 genes: ATM, BARD1, BRCA1, BRCA2, CDH1, CHEK2, NF1, PALB2, PTEN, RAD51C, RAD51D, STK11 and TP53.    07/19/2023 Surgery   s/p Right breast lumpectomy with SLNB by Dr.  Delane Fear   A. RIGHT BREAST, ANTERIOR LUMPECTOMY: Invasive pleomorphic lobular carcinoma, grade 2 (3+3+1) Tumor measures 1.3 x 0.9 x 0.5 cm Tumor present in posterior margin (2 mm from lateral margin, 6 mm from anterior margin, 7.5 mm from inferior margin and 10 mm from medial margin) Negative for angiolymphatic invasion Prognostic markers (from report [id number]): Estrogen receptor positive, progesterone receptor positive, Ki-67 5% and HER2/neu oncoprotein expression negative (1+) Changes consistent with prior biopsy Radial scar and fibrocystic changes including cystic dilatation of ducts and usual duct hyperplasia B. RIGHT AXILLARY SENTINEL LYMPH NODE, EXCISION: One benign fatty replaced node, negative for carcinoma (0/1) C. RIGHT BREAST, LUMPECTOMY (WITH CLIP): Invasive pleomorphic lobular carcinoma, grade 2 (3+3+1) Tumor measures 1.8 x 1.0 x 0.5 cm Tumor present in superior and inferior lateral margin (6 mm from posterior margin and 8 mm from medial margin) Negative for angiolymphatic invasion Prognostic markers (from report [id number]): As above Changes consistent with prior biopsy (coil clip)  ONCOLOGY TABLE: INVASIVE CARCINOMA OF THE BREAST: Resection Procedure: Anterior lumpectomy, lumpectomy (with clip) and sentinel node Specimen Laterality: Right Histologic Type: Pleomorphic lobular carcinoma Histologic  Grade: Glandular (Acinar)/Tubular Differentiation: 3 Nuclear Pleomorphism: 3 Mitotic Rate: 1 Overall Grade: Grade 2 (7/9) Tumor Size: 1.3 x 0.9 x 0.5 cm and 1.8 x 1.0 x 0.5 cm Ductal Carcinoma In Situ: Not identified Lymphatic and/or Vascular Invasion: Not identified Treatment Effect in the Breast: No known presurgical therapy Margins: Tumor present in posterior margin of anterior lumpectomy and superior and inferior lateral margins of lumpectomy with clip Distance from Closest Margin (mm): 2 mm from lateral margin, 6 mm from anterior margin, 7.5 mm from inferior margin  and 10 mm from medial margin of anterior lumpectomy; 6 mm from posterior margin and 8 mm from medial margin of lumpectomy with clip Regional Lymph Nodes: Number of Lymph Nodes Examined: 1 Number of Sentinel Nodes Examined: 1 Number of Lymph Nodes with Macrometastases (>2 mm): 0 Number of Lymph Nodes with Micrometastases: 0 Number of Lymph Nodes with Isolated Tumor Cells (=0.2 mm or =200 cells): 0 Distant Metastasis: Not applicable Breast Biomarker Testing Performed on Previous Biopsy: Testing Performed on Case Number: [id number] Estrogen Receptor: Positive (95%, strong staining) Progesterone Receptor: Positive (100%, strong staining) HER2: Negative (1+) Ki-67: 5% Pathologic Stage Classification (pTNM, AJCC 8th Edition): pT1b, pN0   09/12/2023 - 10/10/2023 Radiation Therapy   Adjuvant breast radiation.      Patient reports doing well. She has finished adjuvant breast radiation. She has no new concerns today   MEDICAL HISTORY:  Past Medical History:  Diagnosis Date   Allergy    Family history of breast cancer    GERD (gastroesophageal reflux disease)    History of hiatal hernia    Migraine with aura    PONV (postoperative nausea and vomiting)    Vertigo     SURGICAL HISTORY: Past Surgical History:  Procedure Laterality Date   APPENDECTOMY  05/31/1985   BREAST BIOPSY Right 06/21/2023   US  RT BREAST BX W LOC DEV 1ST LESION IMG BX SPEC US  GUIDE 06/21/2023 [facility]   BREAST BIOPSY  07/18/2023   MM RT RADIOACTIVE SEED LOC MAMMO GUIDE 07/18/2023 [facility]   BREAST LUMPECTOMY WITH RADIOACTIVE SEED AND SENTINEL LYMPH NODE BIOPSY Right 07/19/2023   Procedure: RIGHT BREAST SEED GUIDED LUMPECTOMY AND RIGHT AXILLARY SENTINEL NODE BIOPSY;  Surgeon: Enid Harry, MD;  Location: [facility];  Service: General;  Laterality: Right;  LMA PEC BLOCK   BREAST REDUCTION SURGERY     CHOLECYSTECTOMY     COLONOSCOPY WITH PROPOFOL  N/A 05/12/2021   Procedure:  COLONOSCOPY WITH PROPOFOL ;  Surgeon: Selena Daily, MD;  Location: [facility];  Service: Gastroenterology;  Laterality: N/A;   ESOPHAGOGASTRODUODENOSCOPY (EGD) WITH PROPOFOL  N/A 05/12/2021   Procedure: ESOPHAGOGASTRODUODENOSCOPY (EGD) WITH PROPOFOL ;  Surgeon: Selena Daily, MD;  Location: [facility];  Service: Gastroenterology;  Laterality: N/A;   ESOPHAGOGASTRODUODENOSCOPY (EGD) WITH PROPOFOL  N/A 02/23/2022   Procedure: ESOPHAGOGASTRODUODENOSCOPY (EGD) WITH BIOPSIES;  Surgeon: Selena Daily, MD;  Location: [facility];  Service: Endoscopy;  Laterality: N/A;   ESOPHAGOGASTRODUODENOSCOPY (EGD) WITH PROPOFOL  N/A 05/28/2022   Procedure: ESOPHAGOGASTRODUODENOSCOPY (EGD) WITH PROPOFOL ;  Surgeon: Selena Daily, MD;  Location: [facility];  Service: Gastroenterology;  Laterality: N/A;   RE-EXCISION OF BREAST LUMPECTOMY Right 08/15/2023   Procedure: RE-EXCISION RIGHT BREAST LUMPECTOMY;  Surgeon: Enid Harry, MD;  Location: [facility];  Service: General;  Laterality: Right;  LMA   THYROID  SURGERY  06/01/1987   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    TONSILLECTOMY     TUBAL LIGATION     bilateral  SOCIAL HISTORY: Social History   Socioeconomic History   Marital status: Married    Spouse name: Not on file   Number of children: 2   Years of education: Not on file   Highest education level: Not on file  Occupational History    Employer: [employer]  Tobacco Use   Smoking status: Never   Smokeless tobacco: Never  Vaping Use   Vaping status: Never Used  Substance and Sexual Activity   Alcohol use: No   Drug use: No   Sexual activity: Not on file  Other Topics Concern   Not on file  Social History Narrative   Married 1995 and lives with husband   2 boys, both adult (1 son is working as a Curator for the [employer])   Works at [employer] Strain: Not on file  Food  Insecurity: No Food Insecurity (06/03/2023)   Hunger Vital Sign    Worried About Running Out of Food in the Last Year: Never true    Ran Out of Food in the Last Year: Never true  Transportation Needs: No Transportation Needs (06/03/2023)   PRAPARE - Administrator, Civil Service (Medical): No    Lack of Transportation (Non-Medical): No  Physical Activity: Not on file  Stress: Not on file  Social Connections: Not on file  Intimate Partner Violence: Not At Risk (06/03/2023)   Humiliation, Afraid, Rape, and Kick questionnaire    Fear of Current or Ex-Partner: No    Emotionally Abused: No    Physically Abused: No    Sexually Abused: No    FAMILY HISTORY: Family History  Problem Relation Age of Onset   Hypertension Mother    Diabetes Mother    Breast cancer Paternal Aunt        dx. 60s   Breast cancer Paternal Aunt        dx. 60s   Cirrhosis Paternal Uncle    Heart disease Maternal Grandmother    Heart attack Maternal Grandfather    Liver cancer Paternal Grandmother 39   Heart disease Paternal Grandfather    Breast cancer Other        PGM's sister   Breast cancer Cousin        father's maternal cousin   Breast cancer Cousin 72       paternal first cousin   Colon cancer Neg Hx     ALLERGIES:  is allergic to erythromycin.  MEDICATIONS:  Current Outpatient Medications  Medication Sig Dispense Refill   acetaminophen  (TYLENOL ) 500 MG tablet Take 2 tablets (1,000 mg total) by mouth every 6 (six) hours as needed for mild pain.     cyclobenzaprine  (FLEXERIL ) 5 MG tablet Take 1 tablet (5 mg total) by mouth 2 (two) times daily as needed (for migraines.  sedation caution.). 30 tablet 1   ibuprofen  (ADVIL ) 800 MG tablet Take 800 mg by mouth every 6 (six) hours as needed.     letrozole (FEMARA) 2.5 MG tablet Take 1 tablet (2.5 mg total) by mouth daily. 30 tablet 3   meclizine  (ANTIVERT ) 25 MG tablet Take 1/2 to one tablet prn vertigo 20 tablet 0   omeprazole  (PRILOSEC) 40 MG  capsule Take 1 capsule (40 mg total) by mouth daily. 90 capsule 3   oxyCODONE  (OXY IR/ROXICODONE ) 5 MG immediate release tablet Take 1 tablet (5 mg total) by mouth every 6 (six) hours as needed. 10 tablet  0   No current facility-administered medications for this visit.    Review of Systems  Constitutional:  Negative for appetite change, chills, fatigue and fever.  HENT:   Negative for hearing loss and voice change.   Eyes:  Negative for eye problems.  Respiratory:  Negative for chest tightness and cough.   Cardiovascular:  Negative for chest pain.  Gastrointestinal:  Negative for abdominal distention, abdominal pain and blood in stool.  Endocrine: Negative for hot flashes.  Genitourinary:  Negative for difficulty urinating and frequency.   Musculoskeletal:  Negative for arthralgias.  Skin:  Negative for itching and rash.  Neurological:  Negative for extremity weakness.  Hematological:  Negative for adenopathy.  Psychiatric/Behavioral:  Negative for confusion.    PHYSICAL EXAMINATION: ECOG PERFORMANCE STATUS: 0 - Asymptomatic Vitals:   10/21/23 1221  BP: (!) 150/87  Pulse: 73  Resp: 18  Temp: 98 F (36.7 C)  SpO2: 100%   Filed Weights   10/21/23 1221  Weight: 210 lb (95.3 kg)    Physical Exam HENT:     Head: Normocephalic and atraumatic.  Eyes:     General: No scleral icterus. Cardiovascular:     Rate and Rhythm: Normal rate and regular rhythm.     Heart sounds: Normal heart sounds.  Pulmonary:     Effort: Pulmonary effort is normal. No respiratory distress.     Breath sounds: No wheezing.  Abdominal:     General: Bowel sounds are normal. There is no distension.     Palpations: Abdomen is soft.  Musculoskeletal:        General: No deformity. Normal range of motion.     Cervical back: Normal range of motion and neck supple.  Skin:    General: Skin is warm and dry.     Findings: No erythema or rash.  Neurological:     Mental Status: She is alert and oriented to  person, place, and time. Mental status is at baseline.     Coordination: Coordination normal.  Psychiatric:        Mood and Affect: Mood normal.     LABORATORY DATA:  I have reviewed the data as listed    Latest Ref Rng & Units 10/21/2023   11:57 AM 09/29/2023    9:29 AM 09/15/2023    9:17 AM  CBC  WBC 4.0 - 10.5 K/uL 8.4  9.5  10.3   Hemoglobin 12.0 - 15.0 g/dL 16.1  09.6  04.5   Hematocrit 36.0 - 46.0 % 40.1  40.8  42.5   Platelets 150 - 400 K/uL 338  362  400       Latest Ref Rng & Units 10/21/2023   11:57 AM 01/25/2023    8:05 AM 01/18/2022    7:54 AM  CMP  Glucose 70 - 99 mg/dL 88  99  409   BUN 6 - 20 mg/dL 17  19  13    Creatinine 0.44 - 1.00 mg/dL 8.11  9.14  7.82   Sodium 135 - 145 mmol/L 138  137  141   Potassium 3.5 - 5.1 mmol/L 4.6  4.5  4.4   Chloride 98 - 111 mmol/L 105  103  103   CO2 22 - 32 mmol/L 24  28  28    Calcium 8.9 - 10.3 mg/dL 9.3  9.2  9.4   Total Protein 6.5 - 8.1 g/dL 6.9  6.8  6.8   Total Bilirubin 0.0 - 1.2 mg/dL 0.7  0.3  0.4  Alkaline Phos 38 - 126 U/L 76  76  80   AST 15 - 41 U/L 19  17  17    ALT 0 - 44 U/L 24  21  20        RADIOGRAPHIC STUDIES: I have personally reviewed the radiological images as listed and agreed with the findings in the report. No results found.

## 2023-10-25 ENCOUNTER — Encounter: Payer: Self-pay | Admitting: *Deleted

## 2023-10-28 ENCOUNTER — Ambulatory Visit
Admission: RE | Admit: 2023-10-28 | Discharge: 2023-10-28 | Disposition: A | Discharge status: Home / Self Care | Source: Ambulatory Visit | Attending: Internal Medicine | Admitting: Internal Medicine

## 2023-10-28 DIAGNOSIS — C50911 Malignant neoplasm of unspecified site of right female breast: Secondary | ICD-10-CM | POA: Diagnosis not present

## 2023-10-28 DIAGNOSIS — Z170 Estrogen receptor positive status [ER+]: Secondary | ICD-10-CM | POA: Insufficient documentation

## 2023-10-31 ENCOUNTER — Inpatient Hospital Stay: Attending: Internal Medicine | Admitting: *Deleted

## 2023-10-31 DIAGNOSIS — C50811 Malignant neoplasm of overlapping sites of right female breast: Secondary | ICD-10-CM | POA: Insufficient documentation

## 2023-10-31 DIAGNOSIS — Z171 Estrogen receptor negative status [ER-]: Secondary | ICD-10-CM | POA: Insufficient documentation

## 2023-10-31 DIAGNOSIS — C50911 Malignant neoplasm of unspecified site of right female breast: Secondary | ICD-10-CM

## 2023-10-31 DIAGNOSIS — Z9189 Other specified personal risk factors, not elsewhere classified: Secondary | ICD-10-CM

## 2023-10-31 DIAGNOSIS — Z79899 Other long term (current) drug therapy: Secondary | ICD-10-CM | POA: Insufficient documentation

## 2023-10-31 NOTE — Progress Notes (Signed)
 SUBJECTIVE: Pt returns for her 3 month L-Dex screen.    PAIN:  Are you having pain? No   SOZO SCREENING: Patient was assessed today using the SOZO machine to determine the lymphedema index score. This was compared to her baseline score. It was determined that she is within the recommended range when compared to her baseline and no further action is needed at this time. She will continue SOZO screenings. These are done every 3 months for 2 years post operatively followed by every 6 months for 2 years, and then annually.     L-DEX FLOWSHEETS                L-DEX LYMPHEDEMA SCREENING    Measurement Type Unilateral     L-DEX MEASUREMENT EXTREMITY Upper Extremity     POSITION  Standing     DOMINANT SIDE Right     At Risk Side Right    BASELINE SCORE (UNILATERAL) -3.6    L-DEX SCORE (UNILATERAL) 0.9    VALUE CHANGE (UNILAT) 4.5

## 2023-11-07 ENCOUNTER — Ambulatory Visit
Admission: RE | Admit: 2023-11-07 | Discharge: 2023-11-07 | Disposition: A | Discharge status: Home / Self Care | Source: Ambulatory Visit | Attending: Radiation Oncology | Admitting: Radiation Oncology

## 2023-11-07 VITALS — BP 126/90 | HR 77 | Temp 98.0°F | Resp 14 | Ht 61.0 in | Wt 212.0 lb

## 2023-11-07 DIAGNOSIS — Z79811 Long term (current) use of aromatase inhibitors: Secondary | ICD-10-CM | POA: Insufficient documentation

## 2023-11-07 DIAGNOSIS — R232 Flushing: Secondary | ICD-10-CM | POA: Diagnosis not present

## 2023-11-07 DIAGNOSIS — Z170 Estrogen receptor positive status [ER+]: Secondary | ICD-10-CM | POA: Insufficient documentation

## 2023-11-07 DIAGNOSIS — Z79899 Other long term (current) drug therapy: Secondary | ICD-10-CM | POA: Insufficient documentation

## 2023-11-07 DIAGNOSIS — C50911 Malignant neoplasm of unspecified site of right female breast: Secondary | ICD-10-CM | POA: Insufficient documentation

## 2023-11-07 DIAGNOSIS — Z923 Personal history of irradiation: Secondary | ICD-10-CM | POA: Diagnosis not present

## 2023-11-07 NOTE — Progress Notes (Signed)
 Radiation Oncology Follow up Note  Name: Janet Lynn   Date:   11/07/2023 MRN:  [id number] DOB: 1970/11/09    This 53 y.o. female presents to the clinic today for 1 month follow-up status post whole breast radiation to her right breast for ER positive stage Ia (T1b N0 M0) invasive mammary carcinoma.  REFERRING PROVIDER: Donnie Galea, MD  HPI: Patient is a 53 year old female now out 1 month having completed whole breast radiation to her right breast for stage Ia ER positive invasive mammary carcinoma.  Seen today in routine follow-up she is doing well.  She specifically denies breast tenderness cough or bone pain..  She is currently on Femara  tolerating that well.  Believe she is having some hot flashes for which I have recommended vitamin D supplements.  COMPLICATIONS OF TREATMENT: none  FOLLOW UP COMPLIANCE: keeps appointments   PHYSICAL EXAM:  BP (!) 126/90   Pulse 77   Temp 98 F (36.7 C) (Tympanic)   Resp 14   Ht 5\' 1"  (1.549 m)   Wt 212 lb (96.2 kg)   LMP 10/29/2020   BMI 40.06 kg/m  Lungs are clear to A&P cardiac examination essentially unremarkable with regular rate and rhythm. No dominant mass or nodularity is noted in either breast in 2 positions examined. Incision is well-healed. No axillary or supraclavicular adenopathy is appreciated. Cosmetic result is excellent.  Well-developed well-nourished patient in NAD. HEENT reveals PERLA, EOMI, discs not visualized.  Oral cavity is clear. No oral mucosal lesions are identified. Neck is clear without evidence of cervical or supraclavicular adenopathy. Lungs are clear to A&P. Cardiac examination is essentially unremarkable with regular rate and rhythm without murmur rub or thrill. Abdomen is benign with no organomegaly or masses noted. Motor sensory and DTR levels are equal and symmetric in the upper and lower extremities. Cranial nerves II through XII are grossly intact. Proprioception is intact. No peripheral adenopathy or  edema is identified. No motor or sensory levels are noted. Crude visual fields are within normal range.  RADIOLOGY RESULTS: No current films to review  PLAN: The present time patient is doing well 1 month out from whole breast radiation and pleased with her overall progress.  Of asked to see her back in 6 months for follow-up.  She continues on Femara  without side effect.  Patient knows to call with any concerns.  I would like to take this opportunity to thank you for allowing me to participate in the care of your patient.Glenis Langdon, MD

## 2023-11-15 DIAGNOSIS — C50411 Malignant neoplasm of upper-outer quadrant of right female breast: Secondary | ICD-10-CM | POA: Diagnosis not present

## 2023-12-15 ENCOUNTER — Encounter: Payer: Self-pay | Admitting: *Deleted

## 2023-12-15 NOTE — Progress Notes (Signed)
 Janet Lynn called and had 2 episodes of white nipple discharge on the right side.  Dr. Babara would like her to be seen by [facility], she will see Lauren on Wednesday.   Appt. Details given to her.

## 2023-12-21 ENCOUNTER — Encounter: Payer: Self-pay | Admitting: Nurse Practitioner

## 2023-12-21 ENCOUNTER — Inpatient Hospital Stay: Attending: Internal Medicine | Admitting: Nurse Practitioner

## 2023-12-21 VITALS — BP 134/90 | HR 98 | Temp 98.0°F | Resp 14 | Wt 210.9 lb

## 2023-12-21 DIAGNOSIS — N6452 Nipple discharge: Secondary | ICD-10-CM | POA: Insufficient documentation

## 2023-12-21 DIAGNOSIS — Z800 Family history of malignant neoplasm of digestive organs: Secondary | ICD-10-CM | POA: Insufficient documentation

## 2023-12-21 DIAGNOSIS — N644 Mastodynia: Secondary | ICD-10-CM | POA: Diagnosis not present

## 2023-12-21 DIAGNOSIS — Z79899 Other long term (current) drug therapy: Secondary | ICD-10-CM | POA: Diagnosis not present

## 2023-12-21 DIAGNOSIS — Z9049 Acquired absence of other specified parts of digestive tract: Secondary | ICD-10-CM | POA: Diagnosis not present

## 2023-12-21 DIAGNOSIS — Z17411 Hormone receptor positive with human epidermal growth factor receptor 2 negative status: Secondary | ICD-10-CM | POA: Insufficient documentation

## 2023-12-21 DIAGNOSIS — Z9089 Acquired absence of other organs: Secondary | ICD-10-CM | POA: Diagnosis not present

## 2023-12-21 DIAGNOSIS — Z833 Family history of diabetes mellitus: Secondary | ICD-10-CM | POA: Insufficient documentation

## 2023-12-21 DIAGNOSIS — Z8379 Family history of other diseases of the digestive system: Secondary | ICD-10-CM | POA: Insufficient documentation

## 2023-12-21 DIAGNOSIS — C50811 Malignant neoplasm of overlapping sites of right female breast: Secondary | ICD-10-CM | POA: Insufficient documentation

## 2023-12-21 DIAGNOSIS — Z923 Personal history of irradiation: Secondary | ICD-10-CM | POA: Diagnosis not present

## 2023-12-21 DIAGNOSIS — Z803 Family history of malignant neoplasm of breast: Secondary | ICD-10-CM | POA: Diagnosis not present

## 2023-12-21 DIAGNOSIS — Z79811 Long term (current) use of aromatase inhibitors: Secondary | ICD-10-CM | POA: Diagnosis not present

## 2023-12-21 DIAGNOSIS — Z8249 Family history of ischemic heart disease and other diseases of the circulatory system: Secondary | ICD-10-CM | POA: Diagnosis not present

## 2023-12-21 DIAGNOSIS — Z881 Allergy status to other antibiotic agents status: Secondary | ICD-10-CM | POA: Insufficient documentation

## 2023-12-21 MED ORDER — AMOXICILLIN-POT CLAVULANATE 875-125 MG PO TABS: 1.0000 | ORAL_TABLET | Freq: Two times a day (BID) | ORAL | 0 refills | Status: AC

## 2023-12-21 NOTE — Progress Notes (Signed)
 Symptom Management Clinic  [facility] A Department of the [place]. [facility] [address], [address] [place], [place] [address] [phone] (phone) [phone] (fax)  Patient Care Team: Cleatus Arlyss RAMAN, MD as PCP - General (Family Medicine) Rox Charleston, MD as Consulting Physician (Obstetrics and Gynecology) Lenn Aran, MD as Consulting Physician (Radiation Oncology) Georgina Shasta POUR, RN as Oncology Nurse Navigator Babara Call, MD as Consulting Physician (Oncology)   Name of the patient: Janet Lynn  [id number]  1970-06-12   Date of visit: 12/21/23  Diagnosis- Breast Cancer  Chief complaint/ Reason for visit- nipple Discharge  Heme/Onc history:  Oncology History  Breast cancer (HCC)  05/13/2023 Mammogram   Screening mammogram from 05/13/2023 Showed abnormal mass in the right breast.  Diagnostic mammogram bilateral (06/16/2023) FINDINGS: Additional tomograms were performed of the bilateral breast. There is a spiculated mass in the outer right breast measuring 1.7 cm. There is an oval circumscribed mass in the outer left breast measuring 0.7 cm.   Physical examination of the outer right breast reveals a firm masslike area of thickening at the approximate 8:30 to 9 o'clock position.   Targeted ultrasound of the right breast was performed. There is an irregular hypoechoic mass in the right breast at 9 o'clock 6 cm from nipple measuring 2 x 1.2 x 1.5 cm. This corresponds well with the mass seen in the right breast at mammography. No abnormal lymph nodes identified in the right axilla.   Targeted ultrasound of the left breast was performed. There is a cluster of microcysts in the left breast at 3 o'clock 3 cm from nipple measuring 0.8 x 0.4 x 0.8 cm. This corresponds well with the mass seen in the outer left breast at mammography.   IMPRESSION: Suspicious 2 cm mass in the right breast at the 9 o'clock  position.     06/21/2023 Pathology Results   FINAL DIAGNOSIS        1. Breast, right, needle core biopsy, 9:00 6cmfn coil clip :       -  INVASIVE MAMMARY CARCINOMA   The biopsy material shows an infiltrative proliferation of cells with large vesicular nuclei with inconspicuous nucleoli, arranged linearly and in small clusters. Based on the biopsy, the carcinoma appears Nottingham grade 2 of 3 and measures 1.0 cm in greatest linear extent.   Results:  IMMUNOHISTOCHEMICAL AND MORPHOMETRIC ANALYSIS PERFORMED MANUALLY  The tumor cells are negative for Her2 (1+).  Estrogen Receptor:  95%, POSITIVE, STRONG STAINING INTENSITY  Progesterone Receptor:  100%, POSITIVE, STRONG STAINING INTENSITY  Proliferation Marker Ki67:  5%    06/27/2023 Cancer Staging   Staging form: Breast, AJCC 8th Edition - Clinical: Stage IB (cT2, cN0, cM0, G2, ER+, PR+, HER2-) - Signed by Agrawal, Kavita, MD on 06/27/2023 Stage prefix: Initial diagnosis Histologic grading system: 3 grade system   07/07/2023 Genetic Testing   Negative genetic testing on the BRCAPlus and CancerNext-Expanded+RNA panel  FH p.F225S (c.674T>C) VUS identified.  The report date is July 07, 2023.  The CancerNext-Expanded gene panel offered by [facility] and includes sequencing, rearrangement, and RNA analysis for the following 76 genes: AIP, ALK, APC, ATM, AXIN2, BAP1, BARD1, BMPR1A, BRCA1, BRCA2, BRIP1, CDC73, CDH1, CDK4, CDKN1B, CDKN2A, CEBPA, CHEK2, CTNNA1, DDX41, DICER1, ETV6, FH, FLCN, GATA2, LZTR1, MAX, MBD4, MEN1, MET, MLH1, MSH2, MSH3, MSH6, MUTYH, NF1, NF2, NTHL1, PALB2, PHOX2B, PMS2, POT1, PRKAR1A, PTCH1, PTEN, RAD51C, RAD51D, RB1, RET, RUNX1, SDHA, SDHAF2, SDHB, SDHC, SDHD, SMAD4, SMARCA4,  SMARCB1, SMARCE1, STK11, SUFU, TMEM127, TP53, TSC1, TSC2, VHL, and WT1 (sequencing and deletion/duplication); EGFR, HOXB13, KIT, MITF, PDGFRA, POLD1, and POLE (sequencing only); EPCAM and GREM1 (deletion/duplication only).  The BRCAPlus gene panel  offered by [facility] and includes sequencing and rearrangement analysis for the following 13 genes: ATM, BARD1, BRCA1, BRCA2, CDH1, CHEK2, NF1, PALB2, PTEN, RAD51C, RAD51D, STK11 and TP53.    07/19/2023 Surgery   s/p Right breast lumpectomy with SLNB by Dr. Ebbie   A. RIGHT BREAST, ANTERIOR LUMPECTOMY: Invasive pleomorphic lobular carcinoma, grade 2 (3+3+1) Tumor measures 1.3 x 0.9 x 0.5 cm Tumor present in posterior margin (2 mm from lateral margin, 6 mm from anterior margin, 7.5 mm from inferior margin and 10 mm from medial margin) Negative for angiolymphatic invasion Prognostic markers (from report [id number]): Estrogen receptor positive, progesterone receptor positive, Ki-67 5% and HER2/neu oncoprotein expression negative (1+) Changes consistent with prior biopsy Radial scar and fibrocystic changes including cystic dilatation of ducts and usual duct hyperplasia B. RIGHT AXILLARY SENTINEL LYMPH NODE, EXCISION: One benign fatty replaced node, negative for carcinoma (0/1) C. RIGHT BREAST, LUMPECTOMY (WITH CLIP): Invasive pleomorphic lobular carcinoma, grade 2 (3+3+1) Tumor measures 1.8 x 1.0 x 0.5 cm Tumor present in superior and inferior lateral margin (6 mm from posterior margin and 8 mm from medial margin) Negative for angiolymphatic invasion Prognostic markers (from report [id number]): As above Changes consistent with prior biopsy (coil clip)  ONCOLOGY TABLE: INVASIVE CARCINOMA OF THE BREAST: Resection Procedure: Anterior lumpectomy, lumpectomy (with clip) and sentinel node Specimen Laterality: Right Histologic Type: Pleomorphic lobular carcinoma Histologic Grade: Glandular (Acinar)/Tubular Differentiation: 3 Nuclear Pleomorphism: 3 Mitotic Rate: 1 Overall Grade: Grade 2 (7/9) Tumor Size: 1.3 x 0.9 x 0.5 cm and 1.8 x 1.0 x 0.5 cm Ductal Carcinoma In Situ: Not identified Lymphatic and/or Vascular Invasion: Not identified Treatment Effect in the Breast: No known  presurgical therapy Margins: Tumor present in posterior margin of anterior lumpectomy and superior and inferior lateral margins of lumpectomy with clip Distance from Closest Margin (mm): 2 mm from lateral margin, 6 mm from anterior margin, 7.5 mm from inferior margin and 10 mm from medial margin of anterior lumpectomy; 6 mm from posterior margin and 8 mm from medial margin of lumpectomy with clip Regional Lymph Nodes: Number of Lymph Nodes Examined: 1 Number of Sentinel Nodes Examined: 1 Number of Lymph Nodes with Macrometastases (>2 mm): 0 Number of Lymph Nodes with Micrometastases: 0 Number of Lymph Nodes with Isolated Tumor Cells (=0.2 mm or =200 cells): 0 Distant Metastasis: Not applicable Breast Biomarker Testing Performed on Previous Biopsy: Testing Performed on Case Number: [id number] Estrogen Receptor: Positive (95%, strong staining) Progesterone Receptor: Positive (100%, strong staining) HER2: Negative (1+) Ki-67: 5% Pathologic Stage Classification (pTNM, AJCC 8th Edition): pT1b, pN0   09/12/2023 - 10/10/2023 Radiation Therapy   Adjuvant breast radiation.    10/22/2023 Cancer Staging   Staging form: Breast, AJCC 8th Edition - Pathologic: Stage IA (pT1b, pN0, cM0, G2, ER+, PR+, HER2-, Oncotype DX score: 13) - Signed by Babara Call, MD on 10/22/2023 Stage prefix: Initial diagnosis Multigene prognostic tests performed: Oncotype DX Recurrence score range: Greater than or equal to 11 Histologic grading system: 3 grade system     Interval history- Mariabelen Cashe Gatt is a 53 y.o. female with above history of breast cancer who presents to symptom management clinic for complaints of nipple discharge. Occurred two times. Was thick initially then stringy. White. Right breast. No palpable masses. Breast tender post radiation. No edema.  Otherwise feels well and denies complaints. She completed radiation 2 months ago. On femara  and otherwise tolerating well.   Review of systems- Review of  Systems  Constitutional:  Negative for chills, fever, malaise/fatigue and weight loss.  HENT:  Negative for hearing loss, nosebleeds, sore throat and tinnitus.   Eyes:  Negative for blurred vision and double vision.  Respiratory:  Negative for cough, hemoptysis, shortness of breath and wheezing.   Cardiovascular:  Negative for chest pain, palpitations and leg swelling.  Gastrointestinal:  Negative for abdominal pain, blood in stool, constipation, diarrhea, melena, nausea and vomiting.  Genitourinary:  Negative for dysuria and urgency.  Musculoskeletal:  Negative for back pain, falls, joint pain and myalgias.  Skin:  Negative for itching and rash.  Neurological:  Negative for dizziness, tingling, sensory change, loss of consciousness, weakness and headaches.  Endo/Heme/Allergies:  Negative for environmental allergies. Does not bruise/bleed easily.  Psychiatric/Behavioral:  Negative for depression. The patient is not nervous/anxious and does not have insomnia.     Allergies  Allergen Reactions   Erythromycin Rash    Past Medical History:  Diagnosis Date   Allergy    Family history of breast cancer    GERD (gastroesophageal reflux disease)    History of hiatal hernia    Migraine with aura    PONV (postoperative nausea and vomiting)    Vertigo     Past Surgical History:  Procedure Laterality Date   APPENDECTOMY  05/31/1985   BREAST BIOPSY Right 06/21/2023   US  RT BREAST BX W LOC DEV 1ST LESION IMG BX SPEC US  GUIDE 06/21/2023 [facility]   BREAST BIOPSY  07/18/2023   MM RT RADIOACTIVE SEED LOC MAMMO GUIDE 07/18/2023 [facility]   BREAST LUMPECTOMY WITH RADIOACTIVE SEED AND SENTINEL LYMPH NODE BIOPSY Right 07/19/2023   Procedure: RIGHT BREAST SEED GUIDED LUMPECTOMY AND RIGHT AXILLARY SENTINEL NODE BIOPSY;  Surgeon: Ebbie Cough, MD;  Location: [facility];  Service: General;  Laterality: Right;  LMA PEC BLOCK   BREAST REDUCTION SURGERY      CHOLECYSTECTOMY     COLONOSCOPY WITH PROPOFOL  N/A 05/12/2021   Procedure: COLONOSCOPY WITH PROPOFOL ;  Surgeon: Unk Corinn Skiff, MD;  Location: [facility];  Service: Gastroenterology;  Laterality: N/A;   ESOPHAGOGASTRODUODENOSCOPY (EGD) WITH PROPOFOL  N/A 05/12/2021   Procedure: ESOPHAGOGASTRODUODENOSCOPY (EGD) WITH PROPOFOL ;  Surgeon: Unk Corinn Skiff, MD;  Location: [facility];  Service: Gastroenterology;  Laterality: N/A;   ESOPHAGOGASTRODUODENOSCOPY (EGD) WITH PROPOFOL  N/A 02/23/2022   Procedure: ESOPHAGOGASTRODUODENOSCOPY (EGD) WITH BIOPSIES;  Surgeon: Unk Corinn Skiff, MD;  [facility];  Service: Endoscopy;  Laterality: N/A;   ESOPHAGOGASTRODUODENOSCOPY (EGD) WITH PROPOFOL  N/A 05/28/2022   Procedure: ESOPHAGOGASTRODUODENOSCOPY (EGD) WITH PROPOFOL ;  Surgeon: Unk Corinn Skiff, MD;  Location: [facility];  Service: Gastroenterology;  Laterality: N/A;   RE-EXCISION OF BREAST LUMPECTOMY Right 08/15/2023   Procedure: RE-EXCISION RIGHT BREAST LUMPECTOMY;  Surgeon: Ebbie Cough, MD;  Location: [facility];  Service: General;  Laterality: Right;  LMA   THYROID  SURGERY  06/01/1987   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    TONSILLECTOMY     TUBAL LIGATION     bilateral    Social History   Socioeconomic History   Marital status: Married    Spouse name: Not on file   Number of children: 2   Years of education: Not on file   Highest education level: Not on file  Occupational History    Employer: [employer]  Tobacco Use   Smoking  status: Never   Smokeless tobacco: Never  Vaping Use   Vaping status: Never Used  Substance and Sexual Activity   Alcohol use: No   Drug use: No   Sexual activity: Not on file  Other Topics Concern   Not on file  Social History Narrative   Married 1995 and lives with husband   2 boys, both adult (1 son is working as a Curator for the [employer])   Works at [employer] of  [employer] Strain: Not on file  Food Insecurity: No Food Insecurity (06/03/2023)   Hunger Vital Sign    Worried About Running Out of Food in the Last Year: Never true    Ran Out of Food in the Last Year: Never true  Transportation Needs: No Transportation Needs (06/03/2023)   PRAPARE - Administrator, Civil Service (Medical): No    Lack of Transportation (Non-Medical): No  Physical Activity: Not on file  Stress: Not on file  Social Connections: Not on file  Intimate Partner Violence: Not At Risk (06/03/2023)   Humiliation, Afraid, Rape, and Kick questionnaire    Fear of Current or Ex-Partner: No    Emotionally Abused: No    Physically Abused: No    Sexually Abused: No    Family History  Problem Relation Age of Onset   Hypertension Mother    Diabetes Mother    Breast cancer Paternal Aunt        dx. 60s   Breast cancer Paternal Aunt        dx. 60s   Cirrhosis Paternal Uncle    Heart disease Maternal Grandmother    Heart attack Maternal Grandfather    Liver cancer Paternal Grandmother 66   Heart disease Paternal Grandfather    Breast cancer Other        PGM's sister   Breast cancer Cousin        father's maternal cousin   Breast cancer Cousin 25       paternal first cousin   Colon cancer Neg Hx      Current Outpatient Medications:    acetaminophen  (TYLENOL ) 500 MG tablet, Take 2 tablets (1,000 mg total) by mouth every 6 (six) hours as needed for mild pain., Disp: , Rfl:    amoxicillin -clavulanate (AUGMENTIN ) 875-125 MG tablet, Take 1 tablet by mouth 2 (two) times daily for 7 days., Disp: 14 tablet, Rfl: 0   cyclobenzaprine  (FLEXERIL ) 5 MG tablet, Take 1 tablet (5 mg total) by mouth 2 (two) times daily as needed (for migraines.  sedation caution.)., Disp: 30 tablet, Rfl: 1   ibuprofen  (ADVIL ) 800 MG tablet, Take 800 mg by mouth every 6 (six) hours as needed., Disp: , Rfl:    letrozole  (FEMARA ) 2.5 MG tablet, Take 1 tablet (2.5 mg total) by mouth  daily., Disp: 30 tablet, Rfl: 3   meclizine  (ANTIVERT ) 25 MG tablet, Take 1/2 to one tablet prn vertigo, Disp: 20 tablet, Rfl: 0   omeprazole  (PRILOSEC) 40 MG capsule, Take 1 capsule (40 mg total) by mouth daily., Disp: 90 capsule, Rfl: 3   oxyCODONE  (OXY IR/ROXICODONE ) 5 MG immediate release tablet, Take 1 tablet (5 mg total) by mouth every 6 (six) hours as needed. (Patient not taking: Reported on 12/21/2023), Disp: 10 tablet, Rfl: 0  Physical exam:  Vitals:   12/21/23 1505  BP: (!) 134/90  Pulse: 98  Resp: 14  Temp: 98 F (36.7 C)  TempSrc: Tympanic  SpO2: 100%  Weight: 210 lb 14.4 oz (95.7 kg)   Physical Exam Vitals reviewed.  Constitutional:      Appearance: She is not ill-appearing.  Chest:     Comments: Breast exam was performed in seated and lying down position. Patient is status post right lumpectomy with a well-healed surgical scar. No evidence of any palpable masses. No evidence of axillary adenopathy. White film present on nipple from previous discharge but unable to express any discharge today. No warmth or apparent erythema.   No evidence of any palpable masses or lumps in the left breast. No evidence of left axillary adenopathy. No discharge present.  Neurological:     Mental Status: She is alert and oriented to person, place, and time.  Psychiatric:        Mood and Affect: Mood normal.        Behavior: Behavior normal.     No results found.  Assessment and plan- Patient is a 53 y.o. female who presents to symptom management clinic for complaints of  Nipple Discharge- non-lactational nipple discharge. No palpable mass. Post radiation x 2 months. On aromatase inhibitor. Unilateral. Non-bloody. Recommend diagnostic mammogram and ultrasound to evaluate. I reviewed imaging from 12/23/23- Right diagnostic mammogram did not reveal any areas of radiographic concern or apparent etiology for her nipple discharge. Possibly physiologic? Discussed with patient and discussed  option for breast MRI. Symptoms have not recurred and she prefers to hold off on MRI for now. I reviewed medications and none that are known to cause hyperprolactinemia. If negative, refer to surgery vs endocrinology.    Visit Diagnosis 1. Nipple discharge in female    Patient expressed understanding and was in agreement with this plan. She also understands that She can call clinic at any time with any questions, concerns, or complaints.   Thank you for allowing me to participate in the care of this very pleasant patient.   Tinnie Margaretha, DNP, AGNP-C, AOCNP [facility] [phone]

## 2023-12-21 NOTE — Progress Notes (Signed)
 [facility] visit for right breast/nipple discharge and pain x 2 weeks.

## 2023-12-23 ENCOUNTER — Ambulatory Visit
Admission: RE | Admit: 2023-12-23 | Discharge: 2023-12-23 | Disposition: A | Discharge status: Home / Self Care | Source: Ambulatory Visit | Attending: Nurse Practitioner | Admitting: Nurse Practitioner

## 2023-12-23 DIAGNOSIS — N6452 Nipple discharge: Secondary | ICD-10-CM | POA: Insufficient documentation

## 2023-12-23 DIAGNOSIS — N644 Mastodynia: Secondary | ICD-10-CM | POA: Diagnosis not present

## 2023-12-23 DIAGNOSIS — C50911 Malignant neoplasm of unspecified site of right female breast: Secondary | ICD-10-CM | POA: Diagnosis not present

## 2023-12-23 DIAGNOSIS — R92321 Mammographic fibroglandular density, right breast: Secondary | ICD-10-CM | POA: Diagnosis not present

## 2024-01-06 ENCOUNTER — Telehealth: Payer: Self-pay

## 2024-01-06 NOTE — Telephone Encounter (Signed)
 Copied from CRM [id number]. Topic: Clinical - Request for Lab/Test Order >> Jan 06, 2024  9:32 AM Laymon HERO wrote: Reason for CRM: Patient wanting to get and order for lab work to have it done before her physical on 9/12

## 2024-01-06 NOTE — Telephone Encounter (Signed)
 Please call patient and schedule her for lab appointment prior to her CPE

## 2024-01-09 ENCOUNTER — Ambulatory Visit: Payer: Self-pay | Admitting: Nurse Practitioner

## 2024-01-17 ENCOUNTER — Other Ambulatory Visit: Payer: Self-pay | Admitting: Oncology

## 2024-01-20 ENCOUNTER — Encounter: Payer: Self-pay | Admitting: Oncology

## 2024-01-20 ENCOUNTER — Inpatient Hospital Stay: Attending: Internal Medicine

## 2024-01-20 ENCOUNTER — Ambulatory Visit: Admitting: Oncology

## 2024-01-20 VITALS — BP 164/76 | HR 65 | Temp 97.3°F | Resp 20 | Wt 210.6 lb

## 2024-01-20 DIAGNOSIS — Z79811 Long term (current) use of aromatase inhibitors: Secondary | ICD-10-CM | POA: Insufficient documentation

## 2024-01-20 DIAGNOSIS — Z8379 Family history of other diseases of the digestive system: Secondary | ICD-10-CM | POA: Insufficient documentation

## 2024-01-20 DIAGNOSIS — R232 Flushing: Secondary | ICD-10-CM | POA: Insufficient documentation

## 2024-01-20 DIAGNOSIS — C50911 Malignant neoplasm of unspecified site of right female breast: Secondary | ICD-10-CM | POA: Diagnosis not present

## 2024-01-20 DIAGNOSIS — Z170 Estrogen receptor positive status [ER+]: Secondary | ICD-10-CM | POA: Diagnosis not present

## 2024-01-20 DIAGNOSIS — Z800 Family history of malignant neoplasm of digestive organs: Secondary | ICD-10-CM | POA: Insufficient documentation

## 2024-01-20 DIAGNOSIS — Z881 Allergy status to other antibiotic agents status: Secondary | ICD-10-CM | POA: Diagnosis not present

## 2024-01-20 DIAGNOSIS — C50811 Malignant neoplasm of overlapping sites of right female breast: Secondary | ICD-10-CM | POA: Insufficient documentation

## 2024-01-20 DIAGNOSIS — Z803 Family history of malignant neoplasm of breast: Secondary | ICD-10-CM | POA: Insufficient documentation

## 2024-01-20 DIAGNOSIS — Z79899 Other long term (current) drug therapy: Secondary | ICD-10-CM | POA: Diagnosis not present

## 2024-01-20 DIAGNOSIS — N644 Mastodynia: Secondary | ICD-10-CM | POA: Diagnosis not present

## 2024-01-20 DIAGNOSIS — M2550 Pain in unspecified joint: Secondary | ICD-10-CM | POA: Insufficient documentation

## 2024-01-20 DIAGNOSIS — Z833 Family history of diabetes mellitus: Secondary | ICD-10-CM | POA: Insufficient documentation

## 2024-01-20 DIAGNOSIS — Z9049 Acquired absence of other specified parts of digestive tract: Secondary | ICD-10-CM | POA: Insufficient documentation

## 2024-01-20 DIAGNOSIS — Z923 Personal history of irradiation: Secondary | ICD-10-CM | POA: Insufficient documentation

## 2024-01-20 DIAGNOSIS — Z1732 Human epidermal growth factor receptor 2 negative status: Secondary | ICD-10-CM | POA: Diagnosis not present

## 2024-01-20 DIAGNOSIS — Z8249 Family history of ischemic heart disease and other diseases of the circulatory system: Secondary | ICD-10-CM | POA: Insufficient documentation

## 2024-01-20 DIAGNOSIS — I2510 Atherosclerotic heart disease of native coronary artery without angina pectoris: Secondary | ICD-10-CM | POA: Insufficient documentation

## 2024-01-20 DIAGNOSIS — R21 Rash and other nonspecific skin eruption: Secondary | ICD-10-CM | POA: Diagnosis not present

## 2024-01-20 DIAGNOSIS — N6452 Nipple discharge: Secondary | ICD-10-CM | POA: Insufficient documentation

## 2024-01-20 DIAGNOSIS — Z1721 Progesterone receptor positive status: Secondary | ICD-10-CM | POA: Diagnosis not present

## 2024-01-20 DIAGNOSIS — M8580 Other specified disorders of bone density and structure, unspecified site: Secondary | ICD-10-CM

## 2024-01-20 DIAGNOSIS — Z9089 Acquired absence of other organs: Secondary | ICD-10-CM | POA: Insufficient documentation

## 2024-01-20 LAB — CBC WITH DIFFERENTIAL (CANCER CENTER ONLY)
Abs Immature Granulocytes: 0.05 K/uL (ref 0.00–0.07)
Basophils Absolute: 0.1 K/uL (ref 0.0–0.1)
Basophils Relative: 1 %
Eosinophils Absolute: 0.3 K/uL (ref 0.0–0.5)
Eosinophils Relative: 3 %
HCT: 40 % (ref 36.0–46.0)
Hemoglobin: 13.3 g/dL (ref 12.0–15.0)
Immature Granulocytes: 1 %
Lymphocytes Relative: 25 %
Lymphs Abs: 2.3 K/uL (ref 0.7–4.0)
MCH: 29.3 pg (ref 26.0–34.0)
MCHC: 33.3 g/dL (ref 30.0–36.0)
MCV: 88.1 fL (ref 80.0–100.0)
Monocytes Absolute: 0.8 K/uL (ref 0.1–1.0)
Monocytes Relative: 8 %
Neutro Abs: 6 K/uL (ref 1.7–7.7)
Neutrophils Relative %: 62 %
Platelet Count: 383 K/uL (ref 150–400)
RBC: 4.54 MIL/uL (ref 3.87–5.11)
RDW: 12.6 % (ref 11.5–15.5)
WBC Count: 9.5 K/uL (ref 4.0–10.5)
nRBC: 0 % (ref 0.0–0.2)

## 2024-01-20 LAB — CMP (CANCER CENTER ONLY)
ALT: 25 U/L (ref 0–44)
AST: 20 U/L (ref 15–41)
Albumin: 3.9 g/dL (ref 3.5–5.0)
Alkaline Phosphatase: 75 U/L (ref 38–126)
Anion gap: 8 (ref 5–15)
BUN: 20 mg/dL (ref 6–20)
CO2: 26 mmol/L (ref 22–32)
Calcium: 9.6 mg/dL (ref 8.9–10.3)
Chloride: 100 mmol/L (ref 98–111)
Creatinine: 0.85 mg/dL (ref 0.44–1.00)
GFR, Estimated: >60 mL/min (ref >60)
Glucose, Bld: 87 mg/dL (ref 70–99)
Potassium: 4.3 mmol/L (ref 3.5–5.1)
Sodium: 134 mmol/L — ABNORMAL LOW (ref 135–145)
Total Bilirubin: 0.5 mg/dL (ref 0.0–1.2)
Total Protein: 7.4 g/dL (ref 6.5–8.1)

## 2024-01-20 MED ORDER — LETROZOLE 2.5 MG PO TABS: 2.5000 mg | ORAL_TABLET | Freq: Every day | ORAL | 5 refills | Status: AC

## 2024-01-20 NOTE — Assessment & Plan Note (Addendum)
 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  She tolerates with some side effects.  Rash on her face, unclear if it is due to letrozole  or not.  We discussed about option of switching to other aromatase inhibitor.  Patient will call office if she would like to try other AI.

## 2024-01-20 NOTE — Progress Notes (Signed)
 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  She tolerates with some side effects.  Rash on her face, unclear if it is due to letrozole  or not.  We discussed about option of switching to other aromatase inhibitor.  Patient will call office if she would like to try other AI.   Aromatase inhibitor use Recommend calcium and vitamin D supplementation.  Discussed about supportive measures for vasomotor symptoms  Osteopenia 10/28/2023 DEXA osteopenia. FRAX 10y 5.7% , Repeat DEXA in 2 years.   Orders Placed This Encounter  Procedures   MM DIAG BREAST TOMO BILATERAL    Standing Status:   Future    Expected Date:   06/21/2024    Expiration Date:   01/19/2025    Reason for Exam (SYMPTOM  OR DIAGNOSIS REQUIRED):   Breast cancer    Is the patient pregnant?:   No    Preferred imaging location?:   [facility]   CBC with Differential (Cancer Center Only)    Standing Status:   Future    Expected Date:   07/22/2024    Expiration Date:   01/19/2025   CMP (Cancer Center only)    Standing Status:   Future    Expected Date:   07/22/2024    Expiration Date:   01/19/2025   Follow up in 3 months All questions were  answered. The patient knows to call the clinic with any problems, questions or concerns.  Call Babara, MD, PhD [facility] Hematology Oncology 01/20/2024   HISTORY OF PRESENTING ILLNESS:   Janet Lynn is a  53 y.o.  female with PMH listed below was seen in consultation at the request of  Cleatus Arlyss RAMAN, MD  for evaluation of Stage IB Right breast IDC, ER PR positive, HER2 negative Patient previously followed up with Dr.Agrawal. Patient switched care to me on  Extensive medical record review was performed by me Oncology History  Breast cancer (HCC)  05/13/2023 Mammogram   Screening mammogram from 05/13/2023 Showed abnormal mass in the right breast.  Diagnostic mammogram bilateral (06/16/2023) FINDINGS: Additional tomograms were performed of the bilateral breast. There is a spiculated mass in the outer right breast measuring 1.7 cm. There is an oval circumscribed mass in the outer left breast measuring 0.7 cm.   Physical examination of the outer right breast reveals a firm masslike area of thickening at the approximate 8:30 to 9 o'clock position.   Targeted ultrasound of the right breast was performed. There is an irregular hypoechoic mass in the right breast at 9 o'clock 6 cm from nipple measuring 2 x 1.2 x 1.5 cm. This corresponds well with the mass seen in the right breast at mammography. No abnormal  lymph nodes identified in the right axilla.   Targeted ultrasound of the left breast was performed. There is a cluster of microcysts in the left breast at 3 o'clock 3 cm from nipple measuring 0.8 x 0.4 x 0.8 cm. This corresponds well with the mass seen in the outer left breast at mammography.   IMPRESSION: Suspicious 2 cm mass in the right breast at the 9 o'clock position.     06/21/2023 Pathology Results   FINAL DIAGNOSIS        1. Breast, right, needle core biopsy, 9:00 6cmfn coil clip :       -  INVASIVE MAMMARY CARCINOMA   The biopsy material shows an  infiltrative proliferation of cells with large vesicular nuclei with inconspicuous nucleoli, arranged linearly and in small clusters. Based on the biopsy, the carcinoma appears Nottingham grade 2 of 3 and measures 1.0 cm in greatest linear extent.   Results:  IMMUNOHISTOCHEMICAL AND MORPHOMETRIC ANALYSIS PERFORMED MANUALLY  The tumor cells are negative for Her2 (1+).  Estrogen Receptor:  95%, POSITIVE, STRONG STAINING INTENSITY  Progesterone Receptor:  100%, POSITIVE, STRONG STAINING INTENSITY  Proliferation Marker Ki67:  5%    06/27/2023 Cancer Staging   Staging form: Breast, AJCC 8th Edition - Clinical: Stage IB (cT2, cN0, cM0, G2, ER+, PR+, HER2-) - Signed by Agrawal, Kavita, MD on 06/27/2023 Stage prefix: Initial diagnosis Histologic grading system: 3 grade system   07/07/2023 Genetic Testing   Negative genetic testing on the BRCAPlus and CancerNext-Expanded+RNA panel  FH p.F225S (c.674T>C) VUS identified.  The report date is July 07, 2023.  The CancerNext-Expanded gene panel offered by [facility] and includes sequencing, rearrangement, and RNA analysis for the following 76 genes: AIP, ALK, APC, ATM, AXIN2, BAP1, BARD1, BMPR1A, BRCA1, BRCA2, BRIP1, CDC73, CDH1, CDK4, CDKN1B, CDKN2A, CEBPA, CHEK2, CTNNA1, DDX41, DICER1, ETV6, FH, FLCN, GATA2, LZTR1, MAX, MBD4, MEN1, MET, MLH1, MSH2, MSH3, MSH6, MUTYH, NF1, NF2, NTHL1, PALB2, PHOX2B, PMS2, POT1, PRKAR1A, PTCH1, PTEN, RAD51C, RAD51D, RB1, RET, RUNX1, SDHA, SDHAF2, SDHB, SDHC, SDHD, SMAD4, SMARCA4, SMARCB1, SMARCE1, STK11, SUFU, TMEM127, TP53, TSC1, TSC2, VHL, and WT1 (sequencing and deletion/duplication); EGFR, HOXB13, KIT, MITF, PDGFRA, POLD1, and POLE (sequencing only); EPCAM and GREM1 (deletion/duplication only).  The BRCAPlus gene panel offered by [facility] and includes sequencing and rearrangement analysis for the following 13 genes: ATM, BARD1, BRCA1, BRCA2, CDH1, CHEK2, NF1, PALB2, PTEN, RAD51C, RAD51D, STK11 and TP53.     07/19/2023 Surgery   s/p Right breast lumpectomy with SLNB by Dr. Ebbie   A. RIGHT BREAST, ANTERIOR LUMPECTOMY: Invasive pleomorphic lobular carcinoma, grade 2 (3+3+1) Tumor measures 1.3 x 0.9 x 0.5 cm Tumor present in posterior margin (2 mm from lateral margin, 6 mm from anterior margin, 7.5 mm from inferior margin and 10 mm from medial margin) Negative for angiolymphatic invasion Prognostic markers (from report [id number]): Estrogen receptor positive, progesterone receptor positive, Ki-67 5% and HER2/neu oncoprotein expression negative (1+) Changes consistent with prior biopsy Radial scar and fibrocystic changes including cystic dilatation of ducts and usual duct hyperplasia B. RIGHT AXILLARY SENTINEL LYMPH NODE, EXCISION: One benign fatty replaced node, negative for carcinoma (0/1) C. RIGHT BREAST, LUMPECTOMY (WITH CLIP): Invasive pleomorphic lobular carcinoma, grade 2 (3+3+1) Tumor measures 1.8 x 1.0 x 0.5 cm Tumor present in superior and inferior lateral margin (6 mm from posterior margin and 8 mm from medial margin) Negative for angiolymphatic invasion Prognostic markers (from report [id number]): As above Changes consistent with prior biopsy (coil clip)  ONCOLOGY TABLE: INVASIVE CARCINOMA OF  THE BREAST: Resection Procedure: Anterior lumpectomy, lumpectomy (with clip) and sentinel node Specimen Laterality: Right Histologic Type: Pleomorphic lobular carcinoma Histologic Grade: Glandular (Acinar)/Tubular Differentiation: 3 Nuclear Pleomorphism: 3 Mitotic Rate: 1 Overall Grade: Grade 2 (7/9) Tumor Size: 1.3 x 0.9 x 0.5 cm and 1.8 x 1.0 x 0.5 cm Ductal Carcinoma In Situ: Not identified Lymphatic and/or Vascular Invasion: Not identified Treatment Effect in the Breast: No known presurgical therapy Margins: Tumor present in posterior margin of anterior lumpectomy and superior and inferior lateral margins of lumpectomy with clip Distance from Closest Margin (mm): 2 mm from  lateral margin, 6 mm from anterior margin, 7.5 mm from inferior margin and 10 mm from medial margin of anterior lumpectomy; 6 mm from posterior margin and 8 mm from medial margin of lumpectomy with clip Regional Lymph Nodes: Number of Lymph Nodes Examined: 1 Number of Sentinel Nodes Examined: 1 Number of Lymph Nodes with Macrometastases (>2 mm): 0 Number of Lymph Nodes with Micrometastases: 0 Number of Lymph Nodes with Isolated Tumor Cells (=0.2 mm or =200 cells): 0 Distant Metastasis: Not applicable Breast Biomarker Testing Performed on Previous Biopsy: Testing Performed on Case Number: [id number] Estrogen Receptor: Positive (95%, strong staining) Progesterone Receptor: Positive (100%, strong staining) HER2: Negative (1+) Ki-67: 5% Pathologic Stage Classification (pTNM, AJCC 8th Edition): pT1b, pN0   09/12/2023 - 10/10/2023 Radiation Therapy   Adjuvant breast radiation.    10/22/2023 Cancer Staging   Staging form: Breast, AJCC 8th Edition - Pathologic: Stage IA (pT1b, pN0, cM0, G2, ER+, PR+, HER2-, Oncotype DX score: 13) - Signed by Babara Call, MD on 10/22/2023 Stage prefix: Initial diagnosis Multigene prognostic tests performed: Oncotype DX Recurrence score range: Greater than or equal to 11 Histologic grading system: 3 grade system     Patient reports doing well. She has started Letrozole  2.5mg  daily.  Patient reports that she has developed some rash on the side of her face.  Hot flashes 2-3 times daily.  Mild joint pain.  Vaginal dryness    MEDICAL HISTORY:  Past Medical History:  Diagnosis Date   Allergy    Family history of breast cancer    GERD (gastroesophageal reflux disease)    History of hiatal hernia    Migraine with aura    PONV (postoperative nausea and vomiting)    Vertigo     SURGICAL HISTORY: Past Surgical History:  Procedure Laterality Date   APPENDECTOMY  05/31/1985   BREAST BIOPSY Right 06/21/2023   US  RT BREAST BX W LOC DEV 1ST LESION IMG BX SPEC US   GUIDE 06/21/2023 [facility]   BREAST BIOPSY  07/18/2023   MM RT RADIOACTIVE SEED LOC MAMMO GUIDE 07/18/2023 [facility]   BREAST LUMPECTOMY Right 07/19/2023   [facility]   BREAST LUMPECTOMY WITH RADIOACTIVE SEED AND SENTINEL LYMPH NODE BIOPSY Right 07/19/2023   Procedure: RIGHT BREAST SEED GUIDED LUMPECTOMY AND RIGHT AXILLARY SENTINEL NODE BIOPSY;  Surgeon: Ebbie Cough, MD;  Location: [facility];  Service: General;  Laterality: Right;  LMA PEC BLOCK   BREAST REDUCTION SURGERY Bilateral 2003   CHOLECYSTECTOMY     COLONOSCOPY WITH PROPOFOL  N/A 05/12/2021   Procedure: COLONOSCOPY WITH PROPOFOL ;  Surgeon: Unk Corinn Skiff, MD;  Location: [facility] ENDOSCOPY;  Service: Gastroenterology;  Laterality: N/A;   ESOPHAGOGASTRODUODENOSCOPY (EGD) WITH PROPOFOL  N/A 05/12/2021   Procedure: ESOPHAGOGASTRODUODENOSCOPY (EGD) WITH PROPOFOL ;  Surgeon: Unk Corinn Skiff, MD;  Location: [facility] END[facility];  Service: Gastroenterology;  Laterality: N/A;   ESOPHAGOGASTRODUODENOSCOPY (EGD) WITH PROPOFOL  N/A 02/23/2022   Procedure: ESOPHAGOGASTRODUODENOSCOPY (  EGD) WITH BIOPSIES;  Surgeon: Unk Corinn Skiff, MD;  Location: [facility];  Service: Endoscopy;  Laterality: N/A;   ESOPHAGOGASTRODUODENOSCOPY (EGD) WITH PROPOFOL  N/A 05/28/2022   Procedure: ESOPHAGOGASTRODUODENOSCOPY (EGD) WITH PROPOFOL ;  Surgeon: Unk Corinn Skiff, MD;  Location: [facility] ENDOSCOPY;  Service: Gastroenterology;  Laterality: N/A;   RE-EXCISION OF BREAST LUMPECTOMY Right 08/15/2023   Procedure: RE-EXCISION RIGHT BREAST LUMPECTOMY;  Surgeon: Ebbie Cough, MD;  Location: [facility];  Service: General;  Laterality: Right;  LMA   REDUCTION MAMMAPLASTY     THYROID  SURGERY  06/01/1987   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    TONSILLECTOMY     TUBAL LIGATION     bilateral    SOCIAL HISTORY: Social History   Socioeconomic History   Marital status: Married    Spouse name: Not on file   Number of  children: 2   Years of education: Not on file   Highest education level: Not on file  Occupational History    Employer: [employer]  Tobacco Use   Smoking status: Never   Smokeless tobacco: Never  Vaping Use   Vaping status: Never Used  Substance and Sexual Activity   Alcohol use: No   Drug use: No   Sexual activity: Not on file  Other Topics Concern   Not on file  Social History Narrative   Married 1995 and lives with husband   2 boys, both adult (1 son is working as a Curator for the [employer])   Works at [employer] Strain: Not on file  Food Insecurity: No Food Insecurity (06/03/2023)   Hunger Vital Sign    Worried About Running Out of Food in the Last Year: Never true    Ran Out of Food in the Last Year: Never true  Transportation Needs: No Transportation Needs (06/03/2023)   PRAPARE - Administrator, Civil Service (Medical): No    Lack of Transportation (Non-Medical): No  Physical Activity: Not on file  Stress: Not on file  Social Connections: Not on file  Intimate Partner Violence: Not At Risk (06/03/2023)   Humiliation, Afraid, Rape, and Kick questionnaire    Fear of Current or Ex-Partner: No    Emotionally Abused: No    Physically Abused: No    Sexually Abused: No    FAMILY HISTORY: Family History  Problem Relation Age of Onset   Hypertension Mother    Diabetes Mother    Breast cancer Paternal Aunt        dx. 60s   Breast cancer Paternal Aunt        dx. 60s   Cirrhosis Paternal Uncle    Heart disease Maternal Grandmother    Heart attack Maternal Grandfather    Liver cancer Paternal Grandmother 51   Heart disease Paternal Grandfather    Breast cancer Other        PGM's sister   Breast cancer Cousin        father's maternal cousin   Breast cancer Cousin 4       paternal first cousin   Colon cancer Neg Hx     ALLERGIES:  is allergic to erythromycin.  MEDICATIONS:  Current  Outpatient Medications  Medication Sig Dispense Refill   acetaminophen  (TYLENOL ) 500 MG tablet Take 2 tablets (1,000 mg total) by mouth every 6 (six) hours as needed for mild pain.     cyclobenzaprine  (FLEXERIL ) 5 MG tablet Take 1  tablet (5 mg total) by mouth 2 (two) times daily as needed (for migraines.  sedation caution.). 30 tablet 1   ibuprofen  (ADVIL ) 800 MG tablet Take 800 mg by mouth every 6 (six) hours as needed.     meclizine  (ANTIVERT ) 25 MG tablet Take 1/2 to one tablet prn vertigo 20 tablet 0   omeprazole  (PRILOSEC) 40 MG capsule Take 1 capsule (40 mg total) by mouth daily. 90 capsule 3   letrozole  (FEMARA ) 2.5 MG tablet Take 1 tablet (2.5 mg total) by mouth daily. 30 tablet 5   oxyCODONE  (OXY IR/ROXICODONE ) 5 MG immediate release tablet Take 1 tablet (5 mg total) by mouth every 6 (six) hours as needed. (Patient not taking: Reported on 01/20/2024) 10 tablet 0   No current facility-administered medications for this visit.    Review of Systems  Constitutional:  Negative for appetite change, chills, fatigue and fever.  HENT:   Negative for hearing loss and voice change.   Eyes:  Negative for eye problems.  Respiratory:  Negative for chest tightness and cough.   Cardiovascular:  Negative for chest pain.  Gastrointestinal:  Negative for abdominal distention, abdominal pain and blood in stool.  Endocrine: Negative for hot flashes.  Genitourinary:  Negative for difficulty urinating and frequency.   Musculoskeletal:  Negative for arthralgias.  Skin:  Negative for itching and rash.  Neurological:  Negative for extremity weakness.  Hematological:  Negative for adenopathy.  Psychiatric/Behavioral:  Negative for confusion.    PHYSICAL EXAMINATION: ECOG PERFORMANCE STATUS: 0 - Asymptomatic Vitals:   01/20/24 1148  BP: (!) 164/76  Pulse: 65  Resp: 20  Temp: (!) 97.3 F (36.3 C)  SpO2: 100%   Filed Weights   01/20/24 1148  Weight: 210 lb 9.6 oz (95.5 kg)    Physical  Exam HENT:     Head: Normocephalic and atraumatic.  Eyes:     General: No scleral icterus. Cardiovascular:     Rate and Rhythm: Normal rate and regular rhythm.     Heart sounds: Normal heart sounds.  Pulmonary:     Effort: Pulmonary effort is normal. No respiratory distress.     Breath sounds: No wheezing.  Abdominal:     General: Bowel sounds are normal. There is no distension.     Palpations: Abdomen is soft.  Musculoskeletal:        General: No deformity. Normal range of motion.     Cervical back: Normal range of motion and neck supple.  Skin:    General: Skin is warm and dry.     Findings: No erythema or rash.  Neurological:     Mental Status: She is alert and oriented to person, place, and time. Mental status is at baseline.     Coordination: Coordination normal.  Psychiatric:        Mood and Affect: Mood normal.     LABORATORY DATA:  I have reviewed the data as listed    Latest Ref Rng & Units 01/20/2024   11:32 AM 10/21/2023   11:57 AM 09/29/2023    9:29 AM  CBC  WBC 4.0 - 10.5 K/uL 9.5  8.4  9.5   Hemoglobin 12.0 - 15.0 g/dL 86.6  86.3  86.6   Hematocrit 36.0 - 46.0 % 40.0  40.1  40.8   Platelets 150 - 400 K/uL 383  338  362       Latest Ref Rng & Units 01/20/2024   11:32 AM 10/21/2023   11:57 AM 01/25/2023  8:05 AM  CMP  Glucose 70 - 99 mg/dL 87  88  99   BUN 6 - 20 mg/dL 20  17  19    Creatinine 0.44 - 1.00 mg/dL 9.14  9.13  8.94   Sodium 135 - 145 mmol/L 134  138  137   Potassium 3.5 - 5.1 mmol/L 4.3  4.6  4.5   Chloride 98 - 111 mmol/L 100  105  103   CO2 22 - 32 mmol/L 26  24  28    Calcium 8.9 - 10.3 mg/dL 9.6  9.3  9.2   Total Protein 6.5 - 8.1 g/dL 7.4  6.9  6.8   Total Bilirubin 0.0 - 1.2 mg/dL 0.5  0.7  0.3   Alkaline Phos 38 - 126 U/L 75  76  76   AST 15 - 41 U/L 20  19  17    ALT 0 - 44 U/L 25  24  21        RADIOGRAPHIC STUDIES: I have personally reviewed the radiological images as listed and agreed with the findings in the report. MM 3D  DIAGNOSTIC MAMMOGRAM UNILATERAL RIGHT BREAST Result Date: 12/23/2023 CLINICAL DATA:  Patient is status post RIGHT lumpectomy and radiation for invasive mammary carcinoma diagnosed in January 2025. Lumpectomy was performed in February 2025. patient experienced an episode of RIGHT nipple discharge she noticed in the nipple 1 week ago and has noticed her nipple wet intermittently since then. Diffuse breast pain. EXAM: DIGITAL DIAGNOSTIC UNILATERAL RIGHT MAMMOGRAM WITH TOMOSYNTHESIS AND CAD; ULTRASOUND RIGHT BREAST LIMITED TECHNIQUE: Right digital diagnostic mammography and breast tomosynthesis was performed. The images were evaluated with computer-aided detection. ; Targeted ultrasound examination of the right breast was performed COMPARISON:  Previous exam(s). ACR Breast Density Category b: There are scattered areas of fibroglandular density. FINDINGS: There is density and architectural distortion within the RIGHT breast, consistent with postsurgical changes. These are new in comparison to prior. Spot compression tomosynthesis views were obtained over the area of concern in the RIGHT retroareolar breast. No suspicious mammographic finding is identified in this area. No suspicious mass, microcalcification, or other finding is identified in the RIGHT breast. On physical exam, well-healed scar is noted in the RIGHT outer breast. No suspicious nipple changes are identified. Targeted RIGHT breast ultrasound was performed in the area of concern at the retroareolar breast. No suspicious solid or cystic mass is identified. No suspicious intraductal mass is identified. IMPRESSION: 1. No mammographic or sonographic etiology for RIGHT nipple discharge is identified. This could be physiologic. Should further imaging be desired, bilateral breast MRI with and without contrast would be recommended. Any further workup of the patient's symptoms should be based on the clinical assessment. 2. No mammographic evidence of malignancy in  the RIGHT breast. 3. Breast pain is a common condition, which will often resolve on its own without intervention. It can be affected by hormonal changes, medication side effect, weight changes and fit of the bra. Pain may also be referred from other adjacent areas of the body. Breast pain may be improved by wearing adequate well-fitting support, over-the-counter topical and oral NSAID medication, low-fat diet, and ice/heat as needed. Studies have shown an improvement in cyclic pain with use of evening primrose oil, vitamin D and vitamin E. Clinical follow-up recommended to discuss any further work-up recommendations and appropriate treatment. RECOMMENDATION: 1. Recommend bilateral diagnostic mammogram (with RIGHT and LEFT breast ultrasound if deemed necessary) in December 2025. Patient is due for contralateral screening at this point in time. 2.  Should further imaging be desired of RIGHT nipple discharge, bilateral breast MRI with and without contrast would be recommended. This also could be considered for supplemental screening given history of malignancy at a relatively young age. I have discussed the findings and recommendations with the patient. If applicable, a reminder letter will be sent to the patient regarding the next appointment. BI-RADS CATEGORY  2: Benign. Electronically Signed   By: Corean Salter M.D.   On: 12/23/2023 13:43   US  Breast Limited Uni Right Inc Axilla Result Date: 12/23/2023 CLINICAL DATA:  Patient is status post RIGHT lumpectomy and radiation for invasive mammary carcinoma diagnosed in January 2025. Lumpectomy was performed in February 2025. patient experienced an episode of RIGHT nipple discharge she noticed in the nipple 1 week ago and has noticed her nipple wet intermittently since then. Diffuse breast pain. EXAM: DIGITAL DIAGNOSTIC UNILATERAL RIGHT MAMMOGRAM WITH TOMOSYNTHESIS AND CAD; ULTRASOUND RIGHT BREAST LIMITED TECHNIQUE: Right digital diagnostic mammography and breast  tomosynthesis was performed. The images were evaluated with computer-aided detection. ; Targeted ultrasound examination of the right breast was performed COMPARISON:  Previous exam(s). ACR Breast Density Category b: There are scattered areas of fibroglandular density. FINDINGS: There is density and architectural distortion within the RIGHT breast, consistent with postsurgical changes. These are new in comparison to prior. Spot compression tomosynthesis views were obtained over the area of concern in the RIGHT retroareolar breast. No suspicious mammographic finding is identified in this area. No suspicious mass, microcalcification, or other finding is identified in the RIGHT breast. On physical exam, well-healed scar is noted in the RIGHT outer breast. No suspicious nipple changes are identified. Targeted RIGHT breast ultrasound was performed in the area of concern at the retroareolar breast. No suspicious solid or cystic mass is identified. No suspicious intraductal mass is identified. IMPRESSION: 1. No mammographic or sonographic etiology for RIGHT nipple discharge is identified. This could be physiologic. Should further imaging be desired, bilateral breast MRI with and without contrast would be recommended. Any further workup of the patient's symptoms should be based on the clinical assessment. 2. No mammographic evidence of malignancy in the RIGHT breast. 3. Breast pain is a common condition, which will often resolve on its own without intervention. It can be affected by hormonal changes, medication side effect, weight changes and fit of the bra. Pain may also be referred from other adjacent areas of the body. Breast pain may be improved by wearing adequate well-fitting support, over-the-counter topical and oral NSAID medication, low-fat diet, and ice/heat as needed. Studies have shown an improvement in cyclic pain with use of evening primrose oil, vitamin D and vitamin E. Clinical follow-up recommended to  discuss any further work-up recommendations and appropriate treatment. RECOMMENDATION: 1. Recommend bilateral diagnostic mammogram (with RIGHT and LEFT breast ultrasound if deemed necessary) in December 2025. Patient is due for contralateral screening at this point in time. 2. Should further imaging be desired of RIGHT nipple discharge, bilateral breast MRI with and without contrast would be recommended. This also could be considered for supplemental screening given history of malignancy at a relatively young age. I have discussed the findings and recommendations with the patient. If applicable, a reminder letter will be sent to the patient regarding the next appointment. BI-RADS CATEGORY  2: Benign. Electronically Signed   By: Corean Salter M.D.   On: 12/23/2023 13:43   DG Bone Density Result Date: 11/01/2023 EXAM: DUAL X-RAY ABSORPTIOMETRY (DXA) FOR BONE MINERAL DENSITY 10/28/2023 8:29 am CLINICAL DATA:  53 year  old Female Postmenopausal. breast cancer; screening for osteoporosis History of fragility fracture. TECHNIQUE: An axial (e.g., hips, spine) and/or appendicular (e.g., radius) exam was performed, as appropriate, using GE Secretary/administrator at [facility]. Images are obtained for bone mineral density measurement and are not obtained for diagnostic purposes. [id number] Exclusions: None. COMPARISON:  None. FINDINGS: Scan quality: Good. LUMBAR SPINE (L1-L4): BMD (in g/cm2): 1.041 T-score: -1.2 Z-score: -0.7 LEFT FEMORAL NECK: BMD (in g/cm2): 0.763 T-score: -2.0 Z-score: -1.1 LEFT TOTAL HIP: BMD (in g/cm2): 0.852 T-score: -1.2 Z-score: -0.7 RIGHT FEMORAL NECK: BMD (in g/cm2): 0.753 T-score: -2.1 Z-score: -1.2 RIGHT TOTAL HIP: BMD (in g/cm2): 0.835 T-score: -1.4 Z-score: -0.8 LEFT FOREARM (RADIUS 33%): BMD (in g/cm2): 0.759 T-score: -1.3 Z-score: -1.2 FRAX 10-YEAR PROBABILITY OF FRACTURE: 10-year fracture risk is performed using the [facility] FRAX calculator based on  patient-reported risk factors. Major osteoporotic fracture: 5.7% Hip fracture: 0.7% Other situations known to alter the reliability of the FRAX score should be considered when making treatment decisions, including chronic glucocorticoid use and past treatments. Further guidance on treatment can be found at the [facility] Foundation's website [email or web address]://www.patton.com/. IMPRESSION: Osteopenia based on BMD. Fracture risk is increased. Increased risk is based on low BMD, and history of fragility fracture. RECOMMENDATIONS: 1. All patients should optimize calcium and vitamin D intake. 2. Consider FDA-approved medical therapies in postmenopausal women and men aged 7 years and older, based on the following: - A hip or vertebral (clinical or morphometric) fracture - T-score less than or equal to -2.5 and secondary causes have been excluded. - Low bone mass (T-score between -1.0 and -2.5) and a 10-year probability of a hip fracture greater than or equal to 3% or a 10-year probability of a major osteoporosis-related fracture greater than or equal to 20% based on the [place] -adapted WHO algorithm. - Clinician judgment and/or patient preferences may indicate treatment for people with 10-year fracture probabilities above or below these levels 3. Patients with diagnosis of osteoporosis or at high risk for fracture should have regular bone mineral density tests. For patients eligible for Medicare, routine testing is allowed once every 2 years. The testing frequency can be increased to one year for patients who have rapidly progressing disease, those who are receiving or discontinuing medical therapy to restore bone mass, or have additional risk factors. Electronically Signed   By: Alm Parkins M.D.   On: 11/01/2023 07:46

## 2024-01-20 NOTE — Assessment & Plan Note (Signed)
 10/28/2023 DEXA osteopenia. FRAX 10y 5.7% , Repeat DEXA in 2 years.

## 2024-01-20 NOTE — Assessment & Plan Note (Addendum)
 Recommend calcium and vitamin D supplementation.  Discussed about supportive measures for vasomotor symptoms

## 2024-01-23 ENCOUNTER — Other Ambulatory Visit: Payer: Self-pay | Admitting: Oncology

## 2024-01-23 DIAGNOSIS — Z79811 Long term (current) use of aromatase inhibitors: Secondary | ICD-10-CM

## 2024-01-23 DIAGNOSIS — C50911 Malignant neoplasm of unspecified site of right female breast: Secondary | ICD-10-CM

## 2024-01-30 ENCOUNTER — Other Ambulatory Visit: Payer: Self-pay | Admitting: Family Medicine

## 2024-01-30 DIAGNOSIS — M8580 Other specified disorders of bone density and structure, unspecified site: Secondary | ICD-10-CM

## 2024-01-30 DIAGNOSIS — E785 Hyperlipidemia, unspecified: Secondary | ICD-10-CM

## 2024-02-03 ENCOUNTER — Other Ambulatory Visit

## 2024-02-03 DIAGNOSIS — E785 Hyperlipidemia, unspecified: Secondary | ICD-10-CM | POA: Diagnosis not present

## 2024-02-03 DIAGNOSIS — M8580 Other specified disorders of bone density and structure, unspecified site: Secondary | ICD-10-CM | POA: Diagnosis not present

## 2024-02-03 LAB — VITAMIN D 25 HYDROXY (VIT D DEFICIENCY, FRACTURES): VITD: 32.37 ng/mL (ref 30.00–100.00)

## 2024-02-03 LAB — LIPID PANEL
Cholesterol: 208 mg/dL — ABNORMAL HIGH (ref 0–200)
HDL: 45 mg/dL
LDL Cholesterol: 111 mg/dL — ABNORMAL HIGH (ref 0–99)
NonHDL: 163.09
Total CHOL/HDL Ratio: 5
Triglycerides: 259 mg/dL — ABNORMAL HIGH (ref 0.0–149.0)
VLDL: 51.8 mg/dL — ABNORMAL HIGH (ref 0.0–40.0)

## 2024-02-05 ENCOUNTER — Ambulatory Visit: Payer: Self-pay | Admitting: Family Medicine

## 2024-02-06 ENCOUNTER — Inpatient Hospital Stay: Attending: Internal Medicine | Admitting: *Deleted

## 2024-02-06 DIAGNOSIS — C50811 Malignant neoplasm of overlapping sites of right female breast: Secondary | ICD-10-CM | POA: Insufficient documentation

## 2024-02-06 DIAGNOSIS — Z79899 Other long term (current) drug therapy: Secondary | ICD-10-CM | POA: Insufficient documentation

## 2024-02-06 DIAGNOSIS — C50911 Malignant neoplasm of unspecified site of right female breast: Secondary | ICD-10-CM

## 2024-02-06 DIAGNOSIS — Z9189 Other specified personal risk factors, not elsewhere classified: Secondary | ICD-10-CM

## 2024-02-06 DIAGNOSIS — M8580 Other specified disorders of bone density and structure, unspecified site: Secondary | ICD-10-CM | POA: Insufficient documentation

## 2024-02-06 DIAGNOSIS — Z170 Estrogen receptor positive status [ER+]: Secondary | ICD-10-CM | POA: Insufficient documentation

## 2024-02-06 DIAGNOSIS — Z79811 Long term (current) use of aromatase inhibitors: Secondary | ICD-10-CM | POA: Insufficient documentation

## 2024-02-06 NOTE — Progress Notes (Signed)
 SUBJECTIVE: Pt returns for her 3 month L-Dex screen.    PAIN:  Are you having pain? No   SOZO SCREENING: Patient was assessed today using the SOZO machine to determine the lymphedema index score. This was compared to her baseline score. It was determined that she is within the recommended range when compared to her baseline and no further action is needed at this time. She will continue SOZO screenings. These are done every 3 months for 2 years post operatively followed by every 6 months for 2 years, and then annually.     L-DEX FLOWSHEETS                L-DEX LYMPHEDEMA SCREENING    Measurement Type Unilateral     L-DEX MEASUREMENT EXTREMITY Upper Extremity     POSITION  Standing     DOMINANT SIDE Right     At Risk Side Right    BASELINE SCORE (UNILATERAL) -3.6    L-DEX SCORE (UNILATERAL) 1.1    VALUE CHANGE (UNILAT) 4.7

## 2024-02-07 ENCOUNTER — Encounter: Payer: Self-pay | Admitting: *Deleted

## 2024-02-07 DIAGNOSIS — N6452 Nipple discharge: Secondary | ICD-10-CM

## 2024-02-07 DIAGNOSIS — C50911 Malignant neoplasm of unspecified site of right female breast: Secondary | ICD-10-CM

## 2024-02-07 NOTE — Progress Notes (Signed)
 Janet Lynn continues to have right nipple discharge.   Per Janet Kimberely, NP MRI is the next step as mammogram and ultrasound were normal.  Janet Lynn is willing to proceed, MRI has been ordered and she will be contacted by MRI scheduler to set up the appointment.

## 2024-02-10 ENCOUNTER — Encounter: Payer: Self-pay | Admitting: Family Medicine

## 2024-02-10 ENCOUNTER — Ambulatory Visit (INDEPENDENT_AMBULATORY_CARE_PROVIDER_SITE_OTHER): Admitting: Family Medicine

## 2024-02-10 VITALS — BP 138/86 | HR 95 | Temp 98.5°F | Ht 61.42 in | Wt 214.6 lb

## 2024-02-10 DIAGNOSIS — Z0000 Encounter for general adult medical examination without abnormal findings: Secondary | ICD-10-CM | POA: Diagnosis not present

## 2024-02-10 DIAGNOSIS — Z7189 Other specified counseling: Secondary | ICD-10-CM

## 2024-02-10 DIAGNOSIS — C50911 Malignant neoplasm of unspecified site of right female breast: Secondary | ICD-10-CM

## 2024-02-10 MED ORDER — MULTI-VITAMIN/MINERALS PO TABS: 1.0000 | ORAL_TABLET | Freq: Every day | ORAL | Status: AC

## 2024-02-10 NOTE — Patient Instructions (Addendum)
 Check your BP at home and update us  if persistently above 140/90. I would get a flu/shingles/pneumonia shot, after the MRI.  Take care.  Glad to see you.

## 2024-02-10 NOTE — Progress Notes (Unsigned)
 CPE- See plan.  Routine anticipatory guidance given to patient.  See health maintenance.  The possibility exists that previously documented standard health maintenance information may have been brought forward from a previous encounter into this note.  If needed, that same information has been updated to reflect the current situation based on today's encounter.    Living will d/w pt.  Husband designated if patient were incapacitated.   Tetanus- 2023.   Flu declined.  Covid encouraged.   Shingles- d/w pt.   PNA d/w pt.   Mammogram 2025 and pap per gyn.  I'll defer and she agrees.    Colonoscopy 2022.   DXA 2025 Diet and exercise d/w pt.  HIV and HCV screening prev done ~2018.    Breast hx d/w pt. Per oncology.   MRI pending for nipple discharge.  S/p surgery and radiation.    D/w pt about checking BP at home and updating me if persistently above 140/90  Facial rash improved some in the meantime.    PMH and SH reviewed  Meds, vitals, and allergies reviewed.   ROS: Per HPI.  Unless specifically indicated otherwise in HPI, the patient denies:  General: fever. Eyes: acute vision changes ENT: sore throat Cardiovascular: chest pain Respiratory: SOB GI: vomiting GU: dysuria Musculoskeletal: acute back pain Derm: acute rash Neuro: acute motor dysfunction Psych: worsening mood Endocrine: polydipsia Heme: bleeding Allergy: hayfever  GEN: nad, alert and oriented HEENT: mucous membranes moist NECK: supple w/o LA CV: rrr. PULM: ctab, no inc wob ABD: soft, +bs EXT: no edema SKIN: no acute rash  The 10-year ASCVD risk score (Arnett DK, et al., 2019) is: 2.5%   Values used to calculate the score:     Age: 53 years     Clincally relevant sex: Female     Is Non-Hispanic African American: No     Diabetic: No     Tobacco smoker: No     Systolic Blood Pressure: 146 mmHg     Is BP treated: No     HDL Cholesterol: 45 mg/dL     Total Cholesterol: 208 mg/dL

## 2024-02-12 NOTE — Assessment & Plan Note (Signed)
  Living will d/w pt.  Husband designated if patient were incapacitated.   Tetanus- 2023.   Flu d/w pt.  Covid encouraged.   Shingles- d/w pt.   PNA d/w pt.   Mammogram 2025 and pap per gyn.  I'll defer and she agrees.    Colonoscopy 2022.   DXA 2025 Diet and exercise d/w pt.  HIV and HCV screening prev done ~2018.

## 2024-02-12 NOTE — Assessment & Plan Note (Signed)
 History of.  Would continue letrozole  for now.  Followed by oncology.  MRI pending for nipple discharge.  S/p surgery and radiation.   Would defer vaccination above until she had MRI done.

## 2024-02-12 NOTE — Assessment & Plan Note (Signed)
Living will d/w pt.  Husband designated if patient were incapacitated.  

## 2024-02-13 DIAGNOSIS — C50411 Malignant neoplasm of upper-outer quadrant of right female breast: Secondary | ICD-10-CM | POA: Diagnosis not present

## 2024-02-14 ENCOUNTER — Ambulatory Visit
Admission: RE | Admit: 2024-02-14 | Discharge: 2024-02-14 | Disposition: A | Discharge status: Home / Self Care | Source: Ambulatory Visit | Attending: Nurse Practitioner | Admitting: Nurse Practitioner

## 2024-02-14 DIAGNOSIS — N6452 Nipple discharge: Secondary | ICD-10-CM | POA: Diagnosis not present

## 2024-02-14 DIAGNOSIS — C50911 Malignant neoplasm of unspecified site of right female breast: Secondary | ICD-10-CM | POA: Diagnosis not present

## 2024-02-14 DIAGNOSIS — Z170 Estrogen receptor positive status [ER+]: Secondary | ICD-10-CM | POA: Insufficient documentation

## 2024-02-14 DIAGNOSIS — C50411 Malignant neoplasm of upper-outer quadrant of right female breast: Secondary | ICD-10-CM | POA: Diagnosis not present

## 2024-02-14 MED ORDER — GADOBUTROL 1 MMOL/ML IV SOLN
9.0000 mL | Freq: Once | INTRAVENOUS | Status: AC | PRN
  Administered 2024-02-14: 9 mL via INTRAVENOUS

## 2024-02-20 ENCOUNTER — Ambulatory Visit: Payer: Self-pay | Admitting: Nurse Practitioner

## 2024-02-20 DIAGNOSIS — N6452 Nipple discharge: Secondary | ICD-10-CM

## 2024-02-20 DIAGNOSIS — C50911 Malignant neoplasm of unspecified site of right female breast: Secondary | ICD-10-CM

## 2024-04-15 ENCOUNTER — Other Ambulatory Visit: Payer: Self-pay | Admitting: Family Medicine

## 2024-04-15 DIAGNOSIS — K219 Gastro-esophageal reflux disease without esophagitis: Secondary | ICD-10-CM

## 2024-04-30 ENCOUNTER — Inpatient Hospital Stay: Attending: Internal Medicine | Admitting: *Deleted

## 2024-04-30 DIAGNOSIS — Z9049 Acquired absence of other specified parts of digestive tract: Secondary | ICD-10-CM | POA: Insufficient documentation

## 2024-04-30 DIAGNOSIS — M79661 Pain in right lower leg: Secondary | ICD-10-CM | POA: Insufficient documentation

## 2024-04-30 DIAGNOSIS — Z881 Allergy status to other antibiotic agents status: Secondary | ICD-10-CM | POA: Insufficient documentation

## 2024-04-30 DIAGNOSIS — Z79811 Long term (current) use of aromatase inhibitors: Secondary | ICD-10-CM | POA: Insufficient documentation

## 2024-04-30 DIAGNOSIS — C50811 Malignant neoplasm of overlapping sites of right female breast: Secondary | ICD-10-CM | POA: Insufficient documentation

## 2024-04-30 DIAGNOSIS — Z79899 Other long term (current) drug therapy: Secondary | ICD-10-CM | POA: Insufficient documentation

## 2024-04-30 DIAGNOSIS — C50911 Malignant neoplasm of unspecified site of right female breast: Secondary | ICD-10-CM

## 2024-04-30 DIAGNOSIS — Z1732 Human epidermal growth factor receptor 2 negative status: Secondary | ICD-10-CM | POA: Insufficient documentation

## 2024-04-30 DIAGNOSIS — Z170 Estrogen receptor positive status [ER+]: Secondary | ICD-10-CM | POA: Insufficient documentation

## 2024-04-30 DIAGNOSIS — Z9089 Acquired absence of other organs: Secondary | ICD-10-CM | POA: Insufficient documentation

## 2024-04-30 DIAGNOSIS — Z1721 Progesterone receptor positive status: Secondary | ICD-10-CM | POA: Insufficient documentation

## 2024-04-30 DIAGNOSIS — Z9189 Other specified personal risk factors, not elsewhere classified: Secondary | ICD-10-CM

## 2024-04-30 DIAGNOSIS — Z803 Family history of malignant neoplasm of breast: Secondary | ICD-10-CM | POA: Insufficient documentation

## 2024-04-30 DIAGNOSIS — R600 Localized edema: Secondary | ICD-10-CM | POA: Insufficient documentation

## 2024-04-30 NOTE — Progress Notes (Signed)
 SUBJECTIVE: Pt returns for her 3 month L-Dex screen.    PAIN:  Are you having pain? No   SOZO SCREENING: Patient was assessed today using the SOZO machine to determine the lymphedema index score. This was compared to her baseline score. It was determined that she is within the recommended range when compared to her baseline and no further action is needed at this time. She will continue SOZO screenings. These are done every 3 months for 2 years post operatively followed by every 6 months for 2 years, and then annually.     L-DEX FLOWSHEETS                L-DEX LYMPHEDEMA SCREENING    Measurement Type Unilateral     L-DEX MEASUREMENT EXTREMITY Upper Extremity     POSITION  Standing     DOMINANT SIDE Right     At Risk Side Right    BASELINE SCORE (UNILATERAL) -3.6    L-DEX SCORE (UNILATERAL) 0.8    VALUE CHANGE (UNILAT) 4.4

## 2024-05-06 ENCOUNTER — Encounter: Payer: Self-pay | Admitting: Oncology

## 2024-05-09 ENCOUNTER — Inpatient Hospital Stay
Admission: RE | Admit: 2024-05-09 | Discharge: 2024-05-09 | Attending: Radiation Oncology | Admitting: Radiation Oncology

## 2024-05-09 ENCOUNTER — Encounter: Payer: Self-pay | Admitting: Radiation Oncology

## 2024-05-09 ENCOUNTER — Other Ambulatory Visit: Payer: Self-pay | Admitting: *Deleted

## 2024-05-09 ENCOUNTER — Ambulatory Visit
Admission: RE | Admit: 2024-05-09 | Discharge: 2024-05-09 | Disposition: A | Discharge status: Home / Self Care | Source: Ambulatory Visit | Attending: Hospice and Palliative Medicine | Admitting: Hospice and Palliative Medicine

## 2024-05-09 ENCOUNTER — Inpatient Hospital Stay

## 2024-05-09 ENCOUNTER — Inpatient Hospital Stay (HOSPITAL_BASED_OUTPATIENT_CLINIC_OR_DEPARTMENT_OTHER): Admitting: Hospice and Palliative Medicine

## 2024-05-09 ENCOUNTER — Encounter: Payer: Self-pay | Admitting: Hospice and Palliative Medicine

## 2024-05-09 ENCOUNTER — Other Ambulatory Visit: Payer: Self-pay

## 2024-05-09 VITALS — BP 136/84 | HR 92 | Temp 97.6°F | Resp 19 | Wt 216.9 lb

## 2024-05-09 VITALS — BP 136/84 | HR 92 | Temp 97.6°F | Resp 18

## 2024-05-09 DIAGNOSIS — Z803 Family history of malignant neoplasm of breast: Secondary | ICD-10-CM | POA: Diagnosis not present

## 2024-05-09 DIAGNOSIS — R600 Localized edema: Secondary | ICD-10-CM | POA: Insufficient documentation

## 2024-05-09 DIAGNOSIS — Z79811 Long term (current) use of aromatase inhibitors: Secondary | ICD-10-CM | POA: Diagnosis not present

## 2024-05-09 DIAGNOSIS — Z79899 Other long term (current) drug therapy: Secondary | ICD-10-CM | POA: Diagnosis not present

## 2024-05-09 DIAGNOSIS — Z9049 Acquired absence of other specified parts of digestive tract: Secondary | ICD-10-CM | POA: Diagnosis not present

## 2024-05-09 DIAGNOSIS — Z881 Allergy status to other antibiotic agents status: Secondary | ICD-10-CM | POA: Diagnosis not present

## 2024-05-09 DIAGNOSIS — C50911 Malignant neoplasm of unspecified site of right female breast: Secondary | ICD-10-CM

## 2024-05-09 DIAGNOSIS — C50811 Malignant neoplasm of overlapping sites of right female breast: Secondary | ICD-10-CM | POA: Diagnosis not present

## 2024-05-09 DIAGNOSIS — Z170 Estrogen receptor positive status [ER+]: Secondary | ICD-10-CM | POA: Diagnosis not present

## 2024-05-09 DIAGNOSIS — M79661 Pain in right lower leg: Secondary | ICD-10-CM | POA: Diagnosis not present

## 2024-05-09 DIAGNOSIS — Z1721 Progesterone receptor positive status: Secondary | ICD-10-CM | POA: Diagnosis not present

## 2024-05-09 DIAGNOSIS — Z9089 Acquired absence of other organs: Secondary | ICD-10-CM | POA: Diagnosis not present

## 2024-05-09 DIAGNOSIS — Z1732 Human epidermal growth factor receptor 2 negative status: Secondary | ICD-10-CM | POA: Diagnosis not present

## 2024-05-09 LAB — CBC WITH DIFFERENTIAL (CANCER CENTER ONLY)
Abs Immature Granulocytes: 0.05 K/uL (ref 0.00–0.07)
Basophils Absolute: 0.1 K/uL (ref 0.0–0.1)
Basophils Relative: 1 %
Eosinophils Absolute: 0.3 K/uL (ref 0.0–0.5)
Eosinophils Relative: 3 %
HCT: 39.4 % (ref 36.0–46.0)
Hemoglobin: 13.3 g/dL (ref 12.0–15.0)
Immature Granulocytes: 1 %
Lymphocytes Relative: 22 %
Lymphs Abs: 2.4 K/uL (ref 0.7–4.0)
MCH: 29.6 pg (ref 26.0–34.0)
MCHC: 33.8 g/dL (ref 30.0–36.0)
MCV: 87.6 fL (ref 80.0–100.0)
Monocytes Absolute: 0.8 K/uL (ref 0.1–1.0)
Monocytes Relative: 7 %
Neutro Abs: 7.4 K/uL (ref 1.7–7.7)
Neutrophils Relative %: 66 %
Platelet Count: 391 K/uL (ref 150–400)
RBC: 4.5 MIL/uL (ref 3.87–5.11)
RDW: 12.8 % (ref 11.5–15.5)
WBC Count: 11.1 K/uL — ABNORMAL HIGH (ref 4.0–10.5)
nRBC: 0 % (ref 0.0–0.2)

## 2024-05-09 LAB — CMP (CANCER CENTER ONLY)
ALT: 38 U/L (ref 0–44)
AST: 29 U/L (ref 15–41)
Albumin: 4.2 g/dL (ref 3.5–5.0)
Alkaline Phosphatase: 101 U/L (ref 38–126)
Anion gap: 13 (ref 5–15)
BUN: 15 mg/dL (ref 6–20)
CO2: 23 mmol/L (ref 22–32)
Calcium: 9.9 mg/dL (ref 8.9–10.3)
Chloride: 104 mmol/L (ref 98–111)
Creatinine: 0.98 mg/dL (ref 0.44–1.00)
GFR, Estimated: >60 mL/min (ref >60)
Glucose, Bld: 127 mg/dL — ABNORMAL HIGH (ref 70–99)
Potassium: 4 mmol/L (ref 3.5–5.1)
Sodium: 139 mmol/L (ref 135–145)
Total Bilirubin: 0.3 mg/dL (ref 0.0–1.2)
Total Protein: 6.9 g/dL (ref 6.5–8.1)

## 2024-05-09 NOTE — Progress Notes (Signed)
 Symptom Management Clinic [facility] at [facility] [phone] [phone]  Patient Care Team: Cleatus Arlyss RAMAN, MD as PCP - General (Family Medicine) Rox Charleston, MD as Consulting Physician (Obstetrics and Gynecology) Lenn Aran, MD as Consulting Physician (Radiation Oncology) Georgina Shasta POUR, RN as Oncology Nurse Navigator Babara Call, MD as Consulting Physician (Oncology)   NAME OF PATIENT: Janet Lynn  [id number]  1971-04-17   DATE OF VISIT: 05/09/24  REASON FOR CONSULT: Jaycie Nahomi Hegner is a 53 y.o. 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   INTERVAL HISTORY: Patient status post right breast lumpectomy.  Completed adjuvant RT and is now on treatment with letrozole  2.5 mg daily.  Patient presents [facility] today for evaluation of bilateral pedal edema.  Patient describes several days of bilateral lower extremity edema, worse on the right.  She also has some pain in the right calf.  Denies any injury.  No previous history of swelling.  Denies erythema or drainage.  Denies shortness of breath or chest pain.  Denies any neurologic complaints. Denies recent fevers or illnesses. Denies any easy bleeding or bruising. Reports good appetite and denies weight loss. Denies chest pain. Denies any nausea, vomiting, constipation, or diarrhea. Denies urinary complaints. Patient offers no further specific complaints today.   PAST MEDICAL HISTORY: Past Medical History:  Diagnosis Date   Allergy    Cancer (HCC)    Family history of breast cancer    GERD (gastroesophageal reflux disease)    History of hiatal hernia    Migraine with aura    PONV (postoperative nausea and vomiting)    Vertigo     PAST SURGICAL HISTORY:  Past Surgical History:  Procedure Laterality Date   APPENDECTOMY  05/31/1985   BREAST BIOPSY Right 06/21/2023   US  RT BREAST BX W LOC DEV 1ST LESION IMG BX SPEC  US  GUIDE 06/21/2023 [facility] MAMMO[facility]AP[facility]   BREAST BIOPSY  07/18/2023   MM RT RADIOACTIVE SEED LOC MAMMO GUIDE 07/18/2023 [facility] MAMMOGR[facility]HY   BREAST LUMPECTOMY Right 07/19/2023   [facility]   BREAST LUMPECTOMY WITH RADIOACTIVE SEED AND SENTINEL LYMPH NODE BIOPSY Right 07/19/2023   Procedure: RIGHT BREAST SEED GUIDED LUMPECTOMY AND RIGHT AXILLARY SENTINEL NODE BIOPSY;  Surgeon: Ebbie Cough, MD;  Location: [facility];  Service: General;  Laterality: Right;  LMA PEC BLOCK   BREAST REDUCTION SURGERY Bilateral 2003   CHOLECYSTECTOMY     COLONOSCOPY WITH PROPOFOL  N/A 05/12/2021   Procedure: COLONOSCOPY WITH PROPOFOL ;  Surgeon: Unk Corinn Skiff, MD;  Location: [facility];  Service: Gastroenterology;  Laterality: N/A;   ESOPHAGOGASTRODUODENOSCOPY (EGD) WITH PROPOFOL  N/A 05/12/2021   Procedure: ESOPHAGOGASTRODUODENOSCOPY (EGD) WITH PROPOFOL ;  Surgeon: Unk Corinn Skiff, MD;  Location: [facility];  Service: Gastroenterology;  Laterality: N/A;   ESOPHAGOGASTRODUODENOSCOPY (EGD) WITH PROPOFOL  N/A 02/23/2022   Procedure: ESOPHAGOGASTRODUODENOSCOPY (EGD) WITH BIOPSIES;  Surgeon: Unk Corinn Skiff, MD;  Location: [facility];  Service: Endoscopy;  Laterality: N/A;   ESOPHAGOGASTRODUODENOSCOPY (EGD) WITH PROPOFOL  N/A 05/28/2022   Procedure: ESOPHAGOGASTRODUODENOSCOPY (EGD) WITH PROPOFOL ;  Surgeon: Unk Corinn Skiff, MD;  Location: [facility];  Service: Gastroenterology;  Laterality: N/A;   RE-EXCISION OF BREAST LUMPECTOMY Right 08/15/2023   Procedure: RE-EXCISION RIGHT BREAST LUMPECTOMY;  Surgeon: Ebbie Cough, MD;  Location: [facility];  Service: General;  Laterality: Right;  LMA   REDUCTION MAMMAPLASTY     THYROID  SURGERY  06/01/1987   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   TONSILLECTOMY     TUBAL LIGATION     bilateral    HEMATOLOGY/ONCOLOGY HISTORY:  Oncology History  Breast cancer (HCC)  05/13/2023 Mammogram   Screening mammogram from  05/13/2023 Showed abnormal mass in the right breast.  Diagnostic mammogram bilateral (06/16/2023) FINDINGS: Additional tomograms were performed of the bilateral breast. There is a spiculated mass in the outer right breast measuring 1.7 cm. There is an oval circumscribed mass in the outer left breast measuring 0.7 cm.   Physical examination of the outer right breast reveals a firm masslike area of thickening at the approximate 8:30 to 9 o'clock position.   Targeted ultrasound of the right breast was performed. There is an irregular hypoechoic mass in the right breast at 9 o'clock 6 cm from nipple measuring 2 x 1.2 x 1.5 cm. This corresponds well with the mass seen in the right breast at mammography. No abnormal lymph nodes identified in the right axilla.   Targeted ultrasound of the left breast was performed. There is a cluster of microcysts in the left breast at 3 o'clock 3 cm from nipple measuring 0.8 x 0.4 x 0.8 cm. This corresponds well with the mass seen in the outer left breast at mammography.   IMPRESSION: Suspicious 2 cm mass in the right breast at the 9 o'clock position.     06/21/2023 Pathology Results   FINAL DIAGNOSIS        1. Breast, right, needle core biopsy, 9:00 6cmfn coil clip :       -  INVASIVE MAMMARY CARCINOMA   The biopsy material shows an infiltrative proliferation of cells with large vesicular nuclei with inconspicuous nucleoli, arranged linearly and in small clusters. Based on the biopsy, the carcinoma appears Nottingham grade 2 of 3 and measures 1.0 cm in greatest linear extent.   Results:  IMMUNOHISTOCHEMICAL AND MORPHOMETRIC ANALYSIS PERFORMED MANUALLY  The tumor cells are negative for Her2 (1+).  Estrogen Receptor:  95%, POSITIVE, STRONG STAINING INTENSITY  Progesterone Receptor:  100%, POSITIVE, STRONG STAINING INTENSITY  Proliferation Marker Ki67:  5%    06/27/2023 Cancer Staging   Staging form: Breast, AJCC 8th Edition - Clinical: Stage IB  (cT2, cN0, cM0, G2, ER+, PR+, HER2-) - Signed by Agrawal, Kavita, MD on 06/27/2023 Stage prefix: Initial diagnosis Histologic grading system: 3 grade system   07/07/2023 Genetic Testing   Negative genetic testing on the BRCAPlus and CancerNext-Expanded+RNA panel  FH p.F225S (c.674T>C) VUS identified.  The report date is July 07, 2023.  The CancerNext-Expanded gene panel offered by [facility] and includes sequencing, rearrangement, and RNA analysis for the following 76 genes: AIP, ALK, APC, ATM, AXIN2, BAP1, BARD1, BMPR1A, BRCA1, BRCA2, BRIP1, CDC73, CDH1, CDK4, CDKN1B, CDKN2A, CEBPA, CHEK2, CTNNA1, DDX41, DICER1, ETV6, FH, FLCN, GATA2, LZTR1, MAX, MBD4, MEN1, MET, MLH1, MSH2, MSH3, MSH6, MUTYH, NF1, NF2, NTHL1, PALB2, PHOX2B, PMS2, POT1, PRKAR1A, PTCH1, PTEN, RAD51C, RAD51D, RB1, RET, RUNX1, SDHA, SDHAF2, SDHB, SDHC, SDHD, SMAD4, SMARCA4, SMARCB1, SMARCE1, STK11, SUFU, TMEM127, TP53, TSC1, TSC2, VHL, and WT1 (sequencing and deletion/duplication); EGFR, HOXB13, KIT, MITF, PDGFRA, POLD1, and POLE (sequencing only); EPCAM and GREM1 (deletion/duplication only).  The BRCAPlus gene panel offered by [facility] and includes sequencing and rearrangement analysis for the following 13 genes: ATM, BARD1, BRCA1, BRCA2, CDH1, CHEK2, NF1, PALB2, PTEN, RAD51C, RAD51D, STK11 and TP53.    07/19/2023 Surgery   s/p Right breast lumpectomy with SLNB by Dr. Ebbie   A. RIGHT BREAST, ANTERIOR LUMPECTOMY: Invasive pleomorphic lobular carcinoma, grade 2 (3+3+1) Tumor measures  1.3 x 0.9 x 0.5 cm Tumor present in posterior margin (2 mm from lateral margin, 6 mm from anterior margin, 7.5 mm from inferior margin and 10 mm from medial margin) Negative for angiolymphatic invasion Prognostic markers (from report [id number]): Estrogen receptor positive, progesterone receptor positive, Ki-67 5% and HER2/neu oncoprotein expression negative (1+) Changes consistent with prior biopsy Radial scar and fibrocystic changes  including cystic dilatation of ducts and usual duct hyperplasia B. RIGHT AXILLARY SENTINEL LYMPH NODE, EXCISION: One benign fatty replaced node, negative for carcinoma (0/1) C. RIGHT BREAST, LUMPECTOMY (WITH CLIP): Invasive pleomorphic lobular carcinoma, grade 2 (3+3+1) Tumor measures 1.8 x 1.0 x 0.5 cm Tumor present in superior and inferior lateral margin (6 mm from posterior margin and 8 mm from medial margin) Negative for angiolymphatic invasion Prognostic markers (from report [id number]): As above Changes consistent with prior biopsy (coil clip)  ONCOLOGY TABLE: INVASIVE CARCINOMA OF THE BREAST: Resection Procedure: Anterior lumpectomy, lumpectomy (with clip) and sentinel node Specimen Laterality: Right Histologic Type: Pleomorphic lobular carcinoma Histologic Grade: Glandular (Acinar)/Tubular Differentiation: 3 Nuclear Pleomorphism: 3 Mitotic Rate: 1 Overall Grade: Grade 2 (7/9) Tumor Size: 1.3 x 0.9 x 0.5 cm and 1.8 x 1.0 x 0.5 cm Ductal Carcinoma In Situ: Not identified Lymphatic and/or Vascular Invasion: Not identified Treatment Effect in the Breast: No known presurgical therapy Margins: Tumor present in posterior margin of anterior lumpectomy and superior and inferior lateral margins of lumpectomy with clip Distance from Closest Margin (mm): 2 mm from lateral margin, 6 mm from anterior margin, 7.5 mm from inferior margin and 10 mm from medial margin of anterior lumpectomy; 6 mm from posterior margin and 8 mm from medial margin of lumpectomy with clip Regional Lymph Nodes: Number of Lymph Nodes Examined: 1 Number of Sentinel Nodes Examined: 1 Number of Lymph Nodes with Macrometastases (>2 mm): 0 Number of Lymph Nodes with Micrometastases: 0 Number of Lymph Nodes with Isolated Tumor Cells (=0.2 mm or =200 cells): 0 Distant Metastasis: Not applicable Breast Biomarker Testing Performed on Previous Biopsy: Testing Performed on Case Number: [id number] Estrogen Receptor:  Positive (95%, strong staining) Progesterone Receptor: Positive (100%, strong staining) HER2: Negative (1+) Ki-67: 5% Pathologic Stage Classification (pTNM, AJCC 8th Edition): pT1b, pN0   09/12/2023 - 10/10/2023 Radiation Therapy   Adjuvant breast radiation.    10/21/2023 -  Anti-estrogen oral therapy   Started on Letrozole  2.5mg  daily   10/22/2023 Cancer Staging   Staging form: Breast, AJCC 8th Edition - Pathologic: Stage IA (pT1b, pN0, cM0, G2, ER+, PR+, HER2-, Oncotype DX score: 13) - Signed by Babara Call, MD on 10/22/2023 Stage prefix: Initial diagnosis Multigene prognostic tests performed: Oncotype DX Recurrence score range: Greater than or equal to 11 Histologic grading system: 3 grade system     ALLERGIES:  is allergic to erythromycin.  MEDICATIONS:  Current Outpatient Medications  Medication Sig Dispense Refill   acetaminophen  (TYLENOL ) 500 MG tablet Take 2 tablets (1,000 mg total) by mouth every 6 (six) hours as needed for mild pain.     cyclobenzaprine  (FLEXERIL ) 5 MG tablet Take 1 tablet (5 mg total) by mouth 2 (two) times daily as needed (for migraines.  sedation caution.). 30 tablet 1   ibuprofen  (ADVIL ) 800 MG tablet Take 800 mg by mouth every 6 (six) hours as needed.     letrozole  (FEMARA ) 2.5 MG tablet Take 1 tablet (2.5 mg total) by mouth daily. 30 tablet 5   meclizine  (ANTIVERT ) 25 MG tablet Take 1/2 to one tablet prn vertigo 20 tablet  0   Multiple Vitamins-Minerals (MULTIVITAMIN WITH MINERALS) tablet Take 1 tablet by mouth daily. With calcium and vit D.     omeprazole  (PRILOSEC) 40 MG capsule TAKE 1 CAPSULE (40 MG TOTAL) BY MOUTH DAILY. 90 capsule 3   oxyCODONE  (OXY IR/ROXICODONE ) 5 MG immediate release tablet Take 1 tablet (5 mg total) by mouth every 6 (six) hours as needed. 10 tablet 0   No current facility-administered medications for this visit.    VITAL SIGNS: LMP 10/29/2020  There were no vitals filed for this visit.  Estimated body mass index is 40.43  kg/m as calculated from the following:   Height as of 02/10/24: 5' 1.42 (1.56 m).   Weight as of an earlier encounter on 05/09/24: 216 lb 14.4 oz (98.4 kg).  LABS: CBC:    Component Value Date/Time   WBC 9.5 01/20/2024 1132   WBC 8.4 10/21/2023 1157   HGB 13.3 01/20/2024 1132   HCT 40.0 01/20/2024 1132   PLT 383 01/20/2024 1132   PLT 388 11/27/2019 0000   MCV 88.1 01/20/2024 1132   NEUTROABS 6.0 01/20/2024 1132   LYMPHSABS 2.3 01/20/2024 1132   MONOABS 0.8 01/20/2024 1132   EOSABS 0.3 01/20/2024 1132   BASOSABS 0.1 01/20/2024 1132   Comprehensive Metabolic Panel:    Component Value Date/Time   NA 134 (L) 01/20/2024 1132   K 4.3 01/20/2024 1132   CL 100 01/20/2024 1132   CO2 26 01/20/2024 1132   BUN 20 01/20/2024 1132   CREATININE 0.85 01/20/2024 1132   CREATININE 0.89 11/27/2019 0000   GLUCOSE 87 01/20/2024 1132   CALCIUM 9.6 01/20/2024 1132   AST 20 01/20/2024 1132   ALT 25 01/20/2024 1132   ALKPHOS 75 01/20/2024 1132   BILITOT 0.5 01/20/2024 1132   PROT 7.4 01/20/2024 1132   ALBUMIN 3.9 01/20/2024 1132    RADIOGRAPHIC STUDIES: No results found.  PERFORMANCE STATUS (ECOG) : 1 - Symptomatic but completely ambulatory  Review of Systems Unless otherwise noted, a complete review of systems is negative.  Physical Exam General: NAD Cardiovascular: regular rate and rhythm Pulmonary: clear ant fields Abdomen: soft, nontender, + bowel sounds GU: no suprapubic tenderness Extremities: Bilateral lower edema, no joint deformities, negative Homans Skin: no rashes Neurological: Weakness but otherwise nonfocal  IMPRESSION/PLAN: Breast cancer -on letrozole   Peripheral edema -Letrozole  has an incidence of edema 7 to 18% of patients.  However, given pain in calf, we will proceed with Dopplers to rule out DVT.  If negative, would recommend conservative treatment.  If edema persists, could consider referral to vascular.   Patient expressed understanding and was in  agreement with this plan. She also understands that She can call clinic at any time with any questions, concerns, or complaints.   Thank you for allowing me to participate in the care of this very pleasant patient.   Time Total: 20 minutes  Visit consisted of counseling and education dealing with the complex and emotionally intense issues of symptom management in the setting of serious illness.Greater than 50%  of this time was spent counseling and coordinating care related to the above assessment and plan.  Signed by: Fonda Mower, PhD, NP-C

## 2024-05-09 NOTE — Progress Notes (Signed)
 Radiation Oncology Follow up Note  Name: Janet Lynn   Date:   05/09/2024 MRN:  [id number] DOB: 06-23-1970    This 53 y.o. female presents to the clinic today for 43-month follow-up status post whole breast radiation to her right breast for ER-positive stage Ia invasive mammary carcinoma.  REFERRING PROVIDER: Cleatus Arlyss RAMAN, MD  HPI: Patient is a 53 year old female now out 7 months having completed whole breast radiation to her right breast for ER positive stage I (T1b N0 M0) invasive mammary carcinoma.  Seen today in routine follow-up she is doing well.  She specifically denies breast tenderness cough or bone pain..  Patient had developed some fluid expression from her nipple on the right side underwent an MRI scan of her breast showing no MRI of evidence of malignancy in either breast and no findings to explain patient's right nipple discharge.  She is currently on letrozole  tolerating that well without side effect except for some joint pain.  The nipple discharge has abated.  COMPLICATIONS OF TREATMENT: none  FOLLOW UP COMPLIANCE: keeps appointments   PHYSICAL EXAM:  BP 136/84   Pulse 92   Temp 97.6 F (36.4 C)   Resp 19   Wt 216 lb 14.4 oz (98.4 kg)   LMP 10/29/2020   SpO2 98%   BMI 40.43 kg/m  Lungs are clear to A&P cardiac examination essentially unremarkable with regular rate and rhythm. No dominant mass or nodularity is noted in either breast in 2 positions examined. Incision is well-healed. No axillary or supraclavicular adenopathy is appreciated. Cosmetic result is excellent.  Well-developed well-nourished patient in NAD. HEENT reveals PERLA, EOMI, discs not visualized.  Oral cavity is clear. No oral mucosal lesions are identified. Neck is clear without evidence of cervical or supraclavicular adenopathy. Lungs are clear to A&P. Cardiac examination is essentially unremarkable with regular rate and rhythm without murmur rub or thrill. Abdomen is benign with no  organomegaly or masses noted. Motor sensory and DTR levels are equal and symmetric in the upper and lower extremities. Cranial nerves II through XII are grossly intact. Proprioception is intact. No peripheral adenopathy or edema is identified. No motor or sensory levels are noted. Crude visual fields are within normal range.  RADIOLOGY RESULTS: MRI scan reviewed compatible with above-stated findings  PLAN: At the present time patient is doing well no evidence of disease now at 7 months from whole breast radiation.  I am pleased with her overall progress.  She continues on letrozole .  Of asked to see her back in 6 months for follow-up and then we will start once a year follow-up visits.  Patient comprehends her recommendations well.  I would like to take this opportunity to thank you for allowing me to participate in the care of your patient.SABRA Marcey Penton, MD

## 2024-05-09 NOTE — Telephone Encounter (Signed)
 Dr. Babara recommends [facility] eval for bilat let pain and swelling. Pt agreeable to [facility].

## 2024-05-10 NOTE — Telephone Encounter (Signed)
 I called and updated patient on dopplers, which were negative for DVT. Recommend conservative measures for management of pedal edema. Can consider referral to vascular if symptoms worsen or no improvement.

## 2024-05-16 DIAGNOSIS — Z01419 Encounter for gynecological examination (general) (routine) without abnormal findings: Secondary | ICD-10-CM | POA: Diagnosis not present

## 2024-06-18 ENCOUNTER — Ambulatory Visit
Admission: RE | Admit: 2024-06-18 | Discharge: 2024-06-18 | Disposition: A | Discharge status: Home / Self Care | Source: Ambulatory Visit | Attending: Oncology | Admitting: Oncology

## 2024-06-18 ENCOUNTER — Ambulatory Visit
Admission: RE | Admit: 2024-06-18 | Discharge: 2024-06-18 | Disposition: A | Source: Ambulatory Visit | Attending: Oncology

## 2024-06-18 DIAGNOSIS — Z170 Estrogen receptor positive status [ER+]: Secondary | ICD-10-CM | POA: Insufficient documentation

## 2024-06-18 DIAGNOSIS — C50911 Malignant neoplasm of unspecified site of right female breast: Secondary | ICD-10-CM

## 2024-06-18 DIAGNOSIS — Z79811 Long term (current) use of aromatase inhibitors: Secondary | ICD-10-CM | POA: Insufficient documentation

## 2024-06-18 HISTORY — DX: Personal history of irradiation: Z92.3

## 2024-07-23 ENCOUNTER — Ambulatory Visit: Admitting: Oncology

## 2024-07-23 ENCOUNTER — Other Ambulatory Visit

## 2024-07-30 ENCOUNTER — Inpatient Hospital Stay: Admitting: *Deleted

## 2024-11-07 ENCOUNTER — Ambulatory Visit: Admitting: Radiation Oncology
